# Patient Record
Sex: Male | Born: 1935 | Race: White | Hispanic: No | Marital: Single
Health system: Midwestern US, Community
[De-identification: ages and names within clinical notes are randomized; demographics above are authoritative.]

## PROBLEM LIST (undated history)

## (undated) DIAGNOSIS — I639 Cerebral infarction, unspecified: Secondary | ICD-10-CM

## (undated) DIAGNOSIS — E785 Hyperlipidemia, unspecified: Secondary | ICD-10-CM

## (undated) DIAGNOSIS — I219 Acute myocardial infarction, unspecified: Secondary | ICD-10-CM

## (undated) DIAGNOSIS — R011 Cardiac murmur, unspecified: Secondary | ICD-10-CM

## (undated) DIAGNOSIS — M48061 Spinal stenosis, lumbar region without neurogenic claudication: Secondary | ICD-10-CM

## (undated) DIAGNOSIS — E039 Hypothyroidism, unspecified: Secondary | ICD-10-CM

## (undated) DIAGNOSIS — I6529 Occlusion and stenosis of unspecified carotid artery: Secondary | ICD-10-CM

## (undated) DIAGNOSIS — C801 Malignant (primary) neoplasm, unspecified: Secondary | ICD-10-CM

## (undated) DIAGNOSIS — I10 Essential (primary) hypertension: Secondary | ICD-10-CM

## (undated) DIAGNOSIS — N529 Male erectile dysfunction, unspecified: Secondary | ICD-10-CM

## (undated) HISTORY — PX: CATARACT EXTRACTION, BILATERAL: SHX1313

## (undated) HISTORY — DX: Occlusion and stenosis of unspecified carotid artery: I65.29

## (undated) HISTORY — PX: TONSILLECTOMY AND ADENOIDECTOMY: SUR1326

## (undated) HISTORY — PX: APPENDECTOMY: SHX54

## (undated) HISTORY — DX: Hyperlipidemia, unspecified: E78.5

## (undated) HISTORY — DX: Essential (primary) hypertension: I10

## (undated) HISTORY — DX: Hypothyroidism, unspecified: E03.9

## (undated) HISTORY — DX: Cerebral infarction, unspecified: I63.9

## (undated) HISTORY — DX: Male erectile dysfunction, unspecified: N52.9

## (undated) HISTORY — DX: Malignant (primary) neoplasm, unspecified: C80.1

## (undated) HISTORY — DX: Spinal stenosis, lumbar region without neurogenic claudication: M48.061

---

## 2008-08-17 ENCOUNTER — Encounter (INDEPENDENT_AMBULATORY_CARE_PROVIDER_SITE_OTHER): Payer: Self-pay | Admitting: Internal Medicine

## 2008-08-17 ENCOUNTER — Inpatient Hospital Stay (HOSPITAL_COMMUNITY): Admission: EM | Admit: 2008-08-17 | Discharge: 2008-08-18 | Payer: Self-pay | Admitting: Emergency Medicine

## 2008-08-18 ENCOUNTER — Encounter (INDEPENDENT_AMBULATORY_CARE_PROVIDER_SITE_OTHER): Payer: Self-pay | Admitting: Internal Medicine

## 2010-05-15 LAB — LIPID PANEL
Cholesterol: 173 mg/dL (ref 0–200)
Total CHOL/HDL Ratio: 4.3 RATIO

## 2010-05-15 LAB — COMPREHENSIVE METABOLIC PANEL
BUN: 10 mg/dL (ref 6–23)
Calcium: 9 mg/dL (ref 8.4–10.5)
Glucose, Bld: 107 mg/dL — ABNORMAL HIGH (ref 70–99)
Sodium: 137 mEq/L (ref 135–145)
Total Protein: 6.5 g/dL (ref 6.0–8.3)

## 2010-05-15 LAB — GLUCOSE, CAPILLARY: Glucose-Capillary: 101 mg/dL — ABNORMAL HIGH (ref 70–99)

## 2010-05-15 LAB — BASIC METABOLIC PANEL
BUN: 10 mg/dL (ref 6–23)
CO2: 28 mEq/L (ref 19–32)
Calcium: 9.4 mg/dL (ref 8.4–10.5)
GFR calc non Af Amer: >60 mL/min (ref >60)
Glucose, Bld: 102 mg/dL — ABNORMAL HIGH (ref 70–99)
Potassium: 4.6 mEq/L (ref 3.5–5.1)

## 2010-05-15 LAB — APTT: aPTT: 27 seconds (ref 24–37)

## 2010-05-15 LAB — PROTIME-INR: INR: 1.1 (ref 0.00–1.49)

## 2010-05-15 LAB — CBC
HCT: 43 % (ref 39.0–52.0)
MCHC: 34 g/dL (ref 30.0–36.0)
MCHC: 35.2 g/dL (ref 30.0–36.0)
Platelets: 185 10*3/uL (ref 150–400)
RBC: 4.51 MIL/uL (ref 4.22–5.81)
RDW: 14.6 % (ref 11.5–15.5)

## 2010-05-15 LAB — CK TOTAL AND CKMB (NOT AT ARMC)
CK, MB: 3.7 ng/mL (ref 0.3–4.0)
Total CK: 130 U/L (ref 7–232)

## 2010-05-15 LAB — DIFFERENTIAL
Lymphocytes Relative: 19 % (ref 12–46)
Lymphs Abs: 1.1 10*3/uL (ref 0.7–4.0)
Monocytes Relative: 10 % (ref 3–12)
Neutro Abs: 4.1 10*3/uL (ref 1.7–7.7)
Neutrophils Relative %: 68 % (ref 43–77)

## 2010-05-15 LAB — DRUGS OF ABUSE SCREEN W/O ALC, ROUTINE URINE
Benzodiazepines.: NEGATIVE
Cocaine Metabolites: NEGATIVE
Methadone: NEGATIVE
Opiate Screen, Urine: NEGATIVE

## 2010-06-21 NOTE — Consult Note (Signed)
NAMEKAYODE, PETION NO.:  [id number]   MEDICAL RECORD NO.:  [id number]          PATIENT TYPE:  INP   LOCATION:  3703                         FACILITY:  [facility]   PHYSICIAN:  Pramod P. Pearlean Brownie, MD    DATE OF BIRTH:  14-Jul-1935   DATE OF CONSULTATION:  08/17/2008  DATE OF DISCHARGE:                                 CONSULTATION   REFERRING PHYSICIAN:  Kennon Rounds, MD   REASON FOR REFERRAL:  Stroke.   HISTORY OF PRESENT ILLNESS:  Mr. Esty is a 75 year old pleasant  Caucasian male who developed sudden onset of dizziness and gait ataxia  and right foot numbness 2 days ago when he was at a concert and got up  during the intermission to go to the restroom.  He denied any nausea,  blurred vision, vertigo, or focal extremity weakness.  He stated that he  did notice that his right hand was little clumsy and possibly weak,  These symptoms have persisted for last 2 days without significant  worsening or improving.  He denies any headache.  No prior history of  stroke, TIA, or seizures.  He did do spare bending the whole of Saturday  but did not a mask.   PAST MEDICAL HISTORY:  1. Hypertension.  2. Hyperlipidemia.  3. Hypothyroidism.   HOME MEDICATIONS:  Synthroid, amlodipine, and Zocor.   MEDICATION ALLERGIES:  None known.   PAST SURGICAL HISTORY:  Appendicectomy and left hand Dupuytren  contracture surgery x2.   SOCIAL HISTORY:  The patient is married, lives with his wife.  He is  retired but works part-time as a Optician, dispensing as well as a Lawyer.  He does not smoke or drink.   REVIEW OF SYSTEMS:  Negative for recent fever, cough, chest pain,  diarrhea, or any other intercurrent illness.   PHYSICAL EXAMINATION:  GENERAL:  A pleasant, elderly Caucasian gentleman  who is currently not in distress.  VITAL SIGNS:  He is afebrile, pulse rate of 88 per minute and regular,  and respiratory rate 16.  Distal pulses are well felt.  Blood pressure  is 181/85  and oxygen saturations are 97% on room air.  NEUROLOGICAL:  He is pleasant, awake, alert, and cooperative.  There is  no aphasia, apraxia, or dysarthria.  Eye movements are full range.  Visual acuity and fields are accurate.  Face is symmetric.  Tongue is  midline.  Motor system exam reveals no upper extremity drift.  He,  however, has some diminished fine finger movements on the right.  He  orbits the left over right upper extremity.  He has symmetric grip and  lower extremity strength.  Finger-to-nose coordination slightly off on  the right and knee-to-heel is also off on the right.  Sensation is  intact.  Gait was not tested.   The patient does complain of some subjective decreased sensation in the  right foot from ankle down.  On the NIH stroke scale, he scored 2.  Modified Rankin scale 1.   DATA REVIEWED:  CT scan of the head without contrast shows no acute  abnormality.  ADMISSION LABORATORY DATA:  WBC count and basic metabolic panel labs are  normal.   IMPRESSION:  A 75 year old gentleman with left brain subcortical  infarct, 36 days old.  The patient does not qualify for aggressive  intervention due to time of onset.   PLAN:  The patient is being admitted for further stroke workup.  Check  MRI scan of the brain with MRA of the brain, carotid ultrasound, 2-D  echocardiogram, fasting lipid profile, and hemoglobin A1c.  Start  aspirin 325 mg a day.  The patient may consider participating in IRIS  stroke prevention trial.  I have discussed this briefly with the  patient, he seems interested and we will give more information to review  and make final decision later.  Thank you for the referral.  I will be  happy to follow the patient in consults.           ______________________________  Sunny Schlein. Pearlean Brownie, MD     PPS/MEDQ  D:  08/17/2008  T:  08/18/2008  Job:  [id number]

## 2010-06-21 NOTE — H&P (Signed)
Taylor Keller, Taylor Keller NO.:  [id number]   MEDICAL RECORD NO.:  [id number]          PATIENT TYPE:  EMS   LOCATION:  MAJO                         FACILITY:  [facility]   PHYSICIAN:  Peggye Pitt, M.D. DATE OF BIRTH:  Aug 24, 1935   DATE OF ADMISSION:  08/17/2008  DATE OF DISCHARGE:                              HISTORY & PHYSICAL   PRIMARY CARE PHYSICIAN:  He attends Film/video editor.   CHIEF COMPLAINT:  Right-sided numbness.   HISTORY OF PRESENT ILLNESS:  Taylor Keller is a very pleasant 75 year old  Caucasian gentleman who was at a concert on Saturday which is 2 days  prior to admission, where he started experiencing right gait ataxia,  clumsiness as well as numbness of his right foot and hand.  This slowly  progressed over the weekend, and, on Monday, at his wife's insistence,  he visited his primary care physician who instructed him to come into  the emergency department for further evaluation and management.  He  denies any blurry vision, chest pain, shortness of breath, vertigo,  headache or nausea.   ALLERGIES:  He has no known drug allergies.   PAST MEDICAL HISTORY:  Significant for hypertension, hyperlipidemia,  hypothyroidism.   HOME MEDICATIONS:  He takes Synthroid, amlodipine, simvastatin, Avodart,  all of these at unknown doses.  Will try to contact his outpatient  pharmacy for further medication clarification.   SOCIAL HISTORY:  He is married and lives with his wife, has two  children.  No alcohol, tobacco or illicit drug use.   FAMILY HISTORY:  He had a mother who had a CVA in her 75s, otherwise  noncontributory.   REVIEW OF SYSTEMS:  Negative except as already mentioned on HPI.   PHYSICAL EXAMINATION:  VITAL SIGNS:  Upon admission, blood pressure  181/85, heart rate 68, respirations 24, O2 saturations 97% on room air  with a temperature of 97.7.  GENERAL:  He is alert, awake, oriented x3, does not appear to be in any  distress.  HEENT: Normocephalic, atraumatic.  His pupils are equally reactive to  light and accommodation with intact extraocular movements.  He has no  nystagmus  CARDIOVASCULAR:  He has regular rate and rhythm with no murmurs, rubs or  gallops.  LUNGS:  Clear to auscultation bilaterally.  ABDOMEN:  Soft, nontender, nondistended with positive bowel sounds.  EXTREMITIES:  No clubbing, cyanosis or edema with positive pedal pulses.  SKIN:  He has some hypopigmented areas over his shins as well as arms.  He states that this is a chronic skin condition that is being managed by  a dermatologist.  NEUROLOGICAL EXAM:  Mental status is intact.  His cranial nerves II-XII  are intact.  His muscle strength is 5/5 bilaterally and symmetrically.  DTRs are 2/2+ symmetric.  He has decreased sensation from his right  ankle down.  Proprioception is intact.  Babinski is downgoing  bilaterally.  His finger-to-nose is normal.  I have not ambulated him.   LABORATORIES UPON ADMISSION:  Sodium 137, potassium 3.9, chloride 108,  bicarb 23, BUN 10, creatinine 0.87 with a glucose of  107.  All of his  LFTs are within normal limits.  First set of point-of-care markers is  negative.  WBC 6, hemoglobin 14.6, platelet count 200.  His EKG shows  normal sinus rhythm with no acute ST-T wave changes.   A CT scan of the head shows no acute intracranial abnormalities with  small vessel disease.   ASSESSMENT AND PLAN:  1. A likely subacute left hemispheric cerebrovascular accident.  We      will check an MRI/MRA.  We will start him on aspirin.  We will      initiate a stroke workup including 2-D echocardiogram, carotid      Dopplers, a fasting lipid profile.  We will have physical therapy      and occupational therapy see him.  He has already been evaluated by      Dr. Pearlean Brownie with neurology.  2. For his hypothyroidism, we will check a TSH and try to confirm his      home Synthroid dose.  3. For his hypertension, we will start him  on 10 mg amlodipine daily.  4. For his hyperlipidemia, we will check an FLP and confirm his statin      dose prior to reinitiating.  5. For prophylaxis while in the hospital, he will be on Protonix for      gastrointestinal prophylaxis and on Lovenox for deep venous      thrombosis prophylaxis.      Peggye Pitt, M.D.  Electronically Signed     EH/MEDQ  D:  08/17/2008  T:  08/17/2008  Job:  [id number]

## 2010-06-21 NOTE — Discharge Summary (Signed)
NAMEADRIENNE, Taylor Keller NO.:  [id number]   MEDICAL RECORD NO.:  [id number]          PATIENT TYPE:  INP   LOCATION:  3703                         FACILITY:  [facility]   PHYSICIAN:  Altha Harm, MDDATE OF BIRTH:  1935/08/13   DATE OF ADMISSION:  08/17/2008  DATE OF DISCHARGE:  08/18/2008                               DISCHARGE SUMMARY   DISCHARGE DISPOSITION:  Home.   FINAL DISCHARGE DIAGNOSES:  1. Pontine infarct.  2. Hyperlipidemia.  3. Hypertension.  4. Benign prostatic hypertrophy.  5. Hypothyroidism.   DISCHARGE MEDICATIONS:  1. Synthroid 200 mcg p.o. daily  2. Norvasc 10 mg p.o. daily.  3. Simvastatin 10 mg p.o. daily.  4. Avodart 0.5 mg p.o. daily.  5. Aspirin, enteric-coated 325 mg p.o. daily.   CONSULTANTS:  Dr. Pearlean Brownie, neurology.   PROCEDURE:  None.   DIAGNOSTIC STUDIES:  1. 2-D echocardiogram which shows normal left ventricular size and      function with ejection fraction preserved in the range of 60-65%.      The wall motion is normal without any regional wall motion      abnormalities.  2. CT of the head without contrast done on admission which shows no      acute intracranial findings.  Impression:  Evidence of small vessel      ischemia.  3. MRI and MRA of the brain which shows acute infarct in the left pons      measuring 8 mm without mass effect or hemorrhage, negative MRI      appearance of the brain for age.  Negative intracranial MRA with      dominant left vertebral artery.  4. Study bilateral carotid duplex which shows no ICA stenosis      bilaterally and antegrade vertebral artery flow.   ALLERGIES:  NO KNOWN DRUG ALLERGIES.   PRIMARY CARE PHYSICIAN:  [facility].   CODE STATUS:  Full code.   CHIEF COMPLAINT:  Right-sided numbness.   HISTORY OF PRESENT ILLNESS:  Please refer to the H and P dictated by Dr.  Ardyth Harps for details of the HPI.   HOSPITAL COURSE:  The patient was admitted, started on  aspirin and  observed on telemetry.  The patient had minimal weakness in the right  lower extremity.  MRA, MRI, 2-D echocardiogram and carotid artery duplex  were performed with findings as seen above.  Neurology was consulted who  agreed with management and recommendations for this patient.  The  patient does have some hyperlipidemia and is currently under treatment.  Recommendations are for follow up with his cholesterol in 1-2 months.  The patient was seen by physical therapy who did not identify any  further acute or long-term therapy needs at this time.  In terms of his  chronic condition, the patient is resumed on his Synthroid and his  Avodart and his amlodipine.  The patient is ambulatory.  He is afebrile,  temperature of 98, heart rate 63, blood pressure 161/92, respiratory  rate 20, O2 sats are 95% on room air.  Hemogram and electrolytes were  all  normal.  As noted, cholesterol is mildly elevated at 173 with a LDL  mildly elevated at 109.  Hemoglobin A1c was 5.6.   DIETARY RESTRICTIONS:  The patient should be on a low-cholesterol, low-  sodium diet.   PHYSICAL RESTRICTIONS:  Activity as tolerated.   FOLLOW UP:  The patient to follow up with primary care physician in 1-2  weeks or as needed, and to follow up with neurology in 3 months.  Total  time for this discharge process 30 minutes.      Altha Harm, MD  Electronically Signed     MAM/MEDQ  D:  08/18/2008  T:  08/18/2008  Job:  [id number]

## 2010-07-25 LAB — HM COLONOSCOPY

## 2012-03-14 ENCOUNTER — Ambulatory Visit
Admission: RE | Admit: 2012-03-14 | Discharge: 2012-03-14 | Disposition: A | Discharge status: Home / Self Care | Payer: Medicare Other | Source: Ambulatory Visit | Attending: Family Medicine | Admitting: Family Medicine

## 2012-03-14 ENCOUNTER — Other Ambulatory Visit: Payer: Self-pay | Admitting: Family Medicine

## 2012-03-14 DIAGNOSIS — R05 Cough: Secondary | ICD-10-CM

## 2012-04-29 ENCOUNTER — Telehealth: Payer: Self-pay | Admitting: Family Medicine

## 2012-04-29 MED ORDER — SIMVASTATIN 20 MG PO TABS: 20.0000 mg | ORAL_TABLET | Freq: Every day | ORAL | Status: DC

## 2012-04-29 NOTE — Telephone Encounter (Signed)
Done

## 2012-05-18 ENCOUNTER — Encounter: Payer: Self-pay | Admitting: Family Medicine

## 2012-05-18 DIAGNOSIS — E785 Hyperlipidemia, unspecified: Secondary | ICD-10-CM | POA: Insufficient documentation

## 2012-05-18 DIAGNOSIS — N529 Male erectile dysfunction, unspecified: Secondary | ICD-10-CM | POA: Insufficient documentation

## 2012-05-18 DIAGNOSIS — I10 Essential (primary) hypertension: Secondary | ICD-10-CM | POA: Insufficient documentation

## 2012-05-18 DIAGNOSIS — E039 Hypothyroidism, unspecified: Secondary | ICD-10-CM | POA: Insufficient documentation

## 2012-05-27 ENCOUNTER — Ambulatory Visit (INDEPENDENT_AMBULATORY_CARE_PROVIDER_SITE_OTHER): Payer: PRIVATE HEALTH INSURANCE | Admitting: Family Medicine

## 2012-05-27 ENCOUNTER — Encounter: Payer: Self-pay | Admitting: Family Medicine

## 2012-05-27 VITALS — BP 120/58 | HR 70 | Temp 98.2°F | Resp 14 | Ht 72.0 in | Wt 201.0 lb

## 2012-05-27 DIAGNOSIS — R0989 Other specified symptoms and signs involving the circulatory and respiratory systems: Secondary | ICD-10-CM

## 2012-05-27 DIAGNOSIS — Z0000 Encounter for general adult medical examination without abnormal findings: Secondary | ICD-10-CM

## 2012-05-27 DIAGNOSIS — E039 Hypothyroidism, unspecified: Secondary | ICD-10-CM

## 2012-05-27 DIAGNOSIS — Z125 Encounter for screening for malignant neoplasm of prostate: Secondary | ICD-10-CM

## 2012-05-27 DIAGNOSIS — E785 Hyperlipidemia, unspecified: Secondary | ICD-10-CM

## 2012-05-27 DIAGNOSIS — I10 Essential (primary) hypertension: Secondary | ICD-10-CM

## 2012-05-27 NOTE — Progress Notes (Signed)
Subjective:    Patient ID: Taylor Keller, male    DOB: [birth year], 77 y.o.   MRN: [id number]  HPI  Patient is here today for complete physical exam.  His cough which he reported at his office visit in February subsided after beginning Nexium. The patient then discontinued the Nexium due to cost.  He now reports some postnasal drip but otherwise feels well. Past Medical History  Diagnosis Date  . Lumbar stenosis     L5  . Hypothyroidism   . Hyperlipidemia   . Hypertension   . Stroke   . ED (erectile dysfunction)    Current Outpatient Prescriptions on File Prior to Visit  Medication Sig Dispense Refill  . amLODipine (NORVASC) 5 MG tablet Take 5 mg by mouth daily.      Marland Kitchen aspirin 81 MG tablet Take 81 mg by mouth daily.      Marland Kitchen esomeprazole (NEXIUM) 40 MG capsule Take 40 mg by mouth daily before breakfast.      . hydrochlorothiazide (MICROZIDE) 12.5 MG capsule Take 12.5 mg by mouth daily.      Marland Kitchen levothyroxine (SYNTHROID, LEVOTHROID) 175 MCG tablet Take 175 mcg by mouth daily before breakfast.      . simvastatin (ZOCOR) 20 MG tablet Take 1 tablet (20 mg total) by mouth at bedtime.  30 tablet  3  . tadalafil (CIALIS) 20 MG tablet Take 20 mg by mouth daily as needed for erectile dysfunction.      . valsartan (DIOVAN) 320 MG tablet Take 320 mg by mouth daily.       No current facility-administered medications on file prior to visit.   Allergies  Allergen Reactions  . Benazepril Cough   History   Social History  . Marital Status: Married    Spouse Name: N/A    Number of Children: N/A  . Years of Education: N/A   Occupational History  . Not on file.   Social History Main Topics  . Smoking status: Never Smoker   . Smokeless tobacco: Not on file  . Alcohol Use: Yes     Comment: Occasional wine  . Drug Use: No  . Sexually Active: Not on file   Other Topics Concern  . Not on file   Social History Narrative  . No narrative on file   Family History  Problem Relation Age of  Onset  . Cancer Sister     colon     Review of Systems  Constitutional: Negative.   HENT: Positive for rhinorrhea and postnasal drip.   Eyes: Negative.   Respiratory: Negative.   Cardiovascular: Negative.   Gastrointestinal: Negative.   Endocrine: Negative.   Genitourinary: Negative.   Allergic/Immunologic: Negative.   Neurological: Negative.   Hematological: Negative.   Psychiatric/Behavioral: Negative.        Objective:   Physical Exam  Constitutional: He is oriented to person, place, and time. He appears well-developed and well-nourished.  HENT:  Head: Normocephalic and atraumatic.  Right Ear: External ear normal.  Left Ear: External ear normal.  Nose: Nose normal.  Mouth/Throat: Oropharynx is clear and moist.  Eyes: Conjunctivae and EOM are normal. Pupils are equal, round, and reactive to light. No scleral icterus.  Neck: Normal range of motion. Neck supple. No JVD present. Carotid bruit is present. No tracheal deviation present. No thyromegaly present.  Cardiovascular: Normal rate and regular rhythm.   Murmur (3/6 over aortic valve) heard. Pulmonary/Chest: Effort normal and breath sounds normal. No respiratory distress. He has no  wheezes. He has no rales. He exhibits no tenderness.  Abdominal: Soft. Bowel sounds are normal. He exhibits no distension and no mass. There is no tenderness. There is no rebound and no guarding.  Genitourinary: Rectum normal, prostate normal and penis normal.  Musculoskeletal: Normal range of motion. He exhibits no edema and no tenderness.  Lymphadenopathy:    He has no cervical adenopathy.  Neurological: He is alert and oriented to person, place, and time. He has normal reflexes. He displays normal reflexes. No cranial nerve deficit. He exhibits normal muscle tone. Coordination normal.  Skin: Skin is warm and dry. No rash noted. No erythema. No pallor.  Psychiatric: He has a normal mood and affect. His behavior is normal. Judgment and  thought content normal.   he has vitiligo on his skin.        Assessment & Plan:  1. Routine general medical examination at a health care facility Normal exam, blood pressure is well controlled, will obtain screening labs with the patient returns fasting. - CBC with Differential; Future - Basic Metabolic Panel; Future - Hepatic Function Panel; Future - Lipid Panel; Future - PSA; Future - US Carotid Duplex Bilateral; Future  2. HTN (hypertension) Controlled same. - Basic Metabolic Panel; Future  3. HLD (hyperlipidemia) LDL is less than 70. - Basic Metabolic Panel; Future - Hepatic Function Panel; Future - Lipid Panel; Future  4. Unspecified hypothyroidism Monitor TSH - TSH; Future  5. Carotid bruit This may be transmitted sounds due to his murmur which I believe is aortic stenosis. However we'll obtain carotid Dopplers to rule out carotid stenosis given his history of CVA - US Carotid Duplex Bilateral; Future  6. Prostate cancer screening Normal rectal exam, obtain PSA. - PSA; Future

## 2012-05-29 ENCOUNTER — Other Ambulatory Visit: Payer: Self-pay | Admitting: Family Medicine

## 2012-05-29 ENCOUNTER — Other Ambulatory Visit (INDEPENDENT_AMBULATORY_CARE_PROVIDER_SITE_OTHER): Payer: PRIVATE HEALTH INSURANCE

## 2012-05-29 DIAGNOSIS — Z0000 Encounter for general adult medical examination without abnormal findings: Secondary | ICD-10-CM

## 2012-05-29 DIAGNOSIS — E039 Hypothyroidism, unspecified: Secondary | ICD-10-CM

## 2012-05-29 DIAGNOSIS — Z125 Encounter for screening for malignant neoplasm of prostate: Secondary | ICD-10-CM

## 2012-05-29 DIAGNOSIS — I10 Essential (primary) hypertension: Secondary | ICD-10-CM

## 2012-05-29 DIAGNOSIS — E785 Hyperlipidemia, unspecified: Secondary | ICD-10-CM

## 2012-05-29 LAB — HEPATIC FUNCTION PANEL
Alkaline Phosphatase: 43 U/L (ref 39–117)
Indirect Bilirubin: 0.3 mg/dL (ref 0.0–0.9)
Total Bilirubin: 0.4 mg/dL (ref 0.3–1.2)

## 2012-05-29 LAB — CBC WITH DIFFERENTIAL/PLATELET
Basophils Relative: 0 % (ref 0–1)
Eosinophils Absolute: 0.1 10*3/uL (ref 0.0–0.7)
Lymphs Abs: 1.1 10*3/uL (ref 0.7–4.0)
MCH: 26.8 pg (ref 26.0–34.0)
Neutrophils Relative %: 74 % (ref 43–77)
Platelets: 436 10*3/uL — ABNORMAL HIGH (ref 150–400)
RBC: 4.03 MIL/uL — ABNORMAL LOW (ref 4.22–5.81)

## 2012-05-29 LAB — TSH: TSH: 2.853 u[IU]/mL (ref 0.350–4.500)

## 2012-05-29 LAB — LIPID PANEL
HDL: 38 mg/dL — ABNORMAL LOW (ref >39)
LDL Cholesterol: 60 mg/dL (ref 0–99)
Total CHOL/HDL Ratio: 2.9 Ratio
VLDL: 11 mg/dL (ref 0–40)

## 2012-05-29 LAB — BASIC METABOLIC PANEL
CO2: 27 mEq/L (ref 19–32)
Calcium: 9.1 mg/dL (ref 8.4–10.5)
Sodium: 135 mEq/L (ref 135–145)

## 2012-05-30 LAB — VITAMIN B12: Vitamin B-12: 380 pg/mL (ref 211–911)

## 2012-06-03 ENCOUNTER — Other Ambulatory Visit: Payer: PRIVATE HEALTH INSURANCE

## 2012-06-04 ENCOUNTER — Telehealth: Payer: Self-pay | Admitting: Family Medicine

## 2012-06-04 MED ORDER — AMLODIPINE BESYLATE 5 MG PO TABS: 5.0000 mg | ORAL_TABLET | Freq: Every day | ORAL | Status: DC

## 2012-06-04 MED ORDER — SIMVASTATIN 20 MG PO TABS: 20.0000 mg | ORAL_TABLET | Freq: Every day | ORAL | Status: DC

## 2012-06-04 MED ORDER — HYDROCHLOROTHIAZIDE 12.5 MG PO CAPS: 12.5000 mg | ORAL_CAPSULE | Freq: Every day | ORAL | Status: DC

## 2012-06-04 NOTE — Telephone Encounter (Signed)
Medication refilled per protocol. 

## 2012-06-05 ENCOUNTER — Ambulatory Visit
Admission: RE | Admit: 2012-06-05 | Discharge: 2012-06-05 | Disposition: A | Discharge status: Home / Self Care | Payer: Medicare Other | Source: Ambulatory Visit | Attending: Family Medicine | Admitting: Family Medicine

## 2012-06-05 DIAGNOSIS — R0989 Other specified symptoms and signs involving the circulatory and respiratory systems: Secondary | ICD-10-CM

## 2012-06-05 DIAGNOSIS — Z0000 Encounter for general adult medical examination without abnormal findings: Secondary | ICD-10-CM

## 2012-06-05 LAB — FECAL OCCULT BLOOD, IMMUNOCHEMICAL: Fecal Occult Blood: NEGATIVE

## 2012-06-08 ENCOUNTER — Other Ambulatory Visit: Payer: Self-pay | Admitting: Family Medicine

## 2012-07-05 ENCOUNTER — Telehealth: Payer: Self-pay | Admitting: Family Medicine

## 2012-07-05 MED ORDER — SIMVASTATIN 20 MG PO TABS: 20.0000 mg | ORAL_TABLET | Freq: Every day | ORAL | Status: DC

## 2012-07-05 MED ORDER — HYDROCHLOROTHIAZIDE 12.5 MG PO CAPS: ORAL_CAPSULE | ORAL | Status: DC

## 2012-07-05 NOTE — Telephone Encounter (Signed)
Rx Refilled  

## 2012-07-08 ENCOUNTER — Other Ambulatory Visit (INDEPENDENT_AMBULATORY_CARE_PROVIDER_SITE_OTHER): Payer: PRIVATE HEALTH INSURANCE

## 2012-07-08 ENCOUNTER — Telehealth: Payer: Self-pay | Admitting: Family Medicine

## 2012-07-08 DIAGNOSIS — D649 Anemia, unspecified: Secondary | ICD-10-CM

## 2012-07-08 LAB — CBC WITH DIFFERENTIAL/PLATELET
Basophils Relative: 0 % (ref 0–1)
Eosinophils Absolute: 0.3 10*3/uL (ref 0.0–0.7)
Eosinophils Relative: 4 % (ref 0–5)
HCT: 34 % — ABNORMAL LOW (ref 39.0–52.0)
Hemoglobin: 10.8 g/dL — ABNORMAL LOW (ref 13.0–17.0)
MCH: 25.8 pg — ABNORMAL LOW (ref 26.0–34.0)
MCHC: 31.8 g/dL (ref 30.0–36.0)
MCV: 81.3 fL (ref 78.0–100.0)
Monocytes Absolute: 0.7 10*3/uL (ref 0.1–1.0)
Monocytes Relative: 9 % (ref 3–12)
RDW: 16.6 % — ABNORMAL HIGH (ref 11.5–15.5)

## 2012-07-08 MED ORDER — AMLODIPINE BESYLATE 5 MG PO TABS: 5.0000 mg | ORAL_TABLET | Freq: Every day | ORAL | Status: DC

## 2012-07-08 NOTE — Telephone Encounter (Signed)
Rx Refilled  

## 2012-08-13 ENCOUNTER — Encounter: Payer: Self-pay | Admitting: Family Medicine

## 2012-08-13 ENCOUNTER — Telehealth: Payer: Self-pay | Admitting: Family Medicine

## 2012-08-14 NOTE — Telephone Encounter (Signed)
LMTRC

## 2012-08-22 NOTE — Telephone Encounter (Signed)
LMTRC

## 2012-08-27 NOTE — Telephone Encounter (Signed)
Pt has been called numerous times with messages left to return call and has failed to do so. Closing note.

## 2012-08-28 ENCOUNTER — Telehealth: Payer: Self-pay | Admitting: Family Medicine

## 2012-08-28 MED ORDER — AMLODIPINE BESYLATE 5 MG PO TABS: 5.0000 mg | ORAL_TABLET | Freq: Every day | ORAL | Status: DC

## 2012-08-28 NOTE — Telephone Encounter (Signed)
Med refilled for 90 day supply  

## 2012-12-04 ENCOUNTER — Ambulatory Visit (INDEPENDENT_AMBULATORY_CARE_PROVIDER_SITE_OTHER): Payer: PRIVATE HEALTH INSURANCE | Admitting: *Deleted

## 2012-12-04 DIAGNOSIS — Z23 Encounter for immunization: Secondary | ICD-10-CM

## 2012-12-10 ENCOUNTER — Encounter: Payer: Self-pay | Admitting: Family Medicine

## 2012-12-10 ENCOUNTER — Ambulatory Visit (INDEPENDENT_AMBULATORY_CARE_PROVIDER_SITE_OTHER): Payer: PRIVATE HEALTH INSURANCE | Admitting: Family Medicine

## 2012-12-10 VITALS — BP 122/58 | HR 78 | Temp 98.6°F | Resp 16 | Ht 72.0 in | Wt 193.0 lb

## 2012-12-10 DIAGNOSIS — H938X9 Other specified disorders of ear, unspecified ear: Secondary | ICD-10-CM

## 2012-12-10 DIAGNOSIS — H938X2 Other specified disorders of left ear: Secondary | ICD-10-CM

## 2012-12-10 NOTE — Progress Notes (Signed)
Subjective:    Patient ID: Taylor Keller, male    DOB: May 07, 1935, 77 y.o.   MRN: [id number]  HPI  Patient is here because of a mass behind his left ear. He states it has been there his entire life. However he is being fitted for a hearing aid interferes with sitting process. It is approximately 3 cm x 2 cm it is pedunculated. It contains visible blood vessels.  It appears to be some type of hemangioma.  He also complains of disequilibrium. This occurs whenever he stands quickly. It lasted momentarily. He denies any vertigo. He believes it could be his blood pressure dropping. He is on amlodipine, Diovan, hydrochlorothiazide for hypertension. His blood pressure today is well controlled 122/58. He denies any syncope or vertigo. He denies any headaches or other neurologic deficits.  Past Medical History  Diagnosis Date  . Lumbar stenosis     L5  . Hypothyroidism   . Hyperlipidemia   . Hypertension   . Stroke   . ED (erectile dysfunction)    Current Outpatient Prescriptions on File Prior to Visit  Medication Sig Dispense Refill  . amLODipine (NORVASC) 5 MG tablet Take 1 tablet (5 mg total) by mouth daily.  90 tablet  1  . aspirin 81 MG tablet Take 81 mg by mouth daily.      Marland Kitchen DIOVAN 320 MG tablet TAKE ONE TABLET BY MOUTH EVERY DAY  90 tablet  0  . esomeprazole (NEXIUM) 40 MG capsule Take 40 mg by mouth daily before breakfast.      . hydrochlorothiazide (MICROZIDE) 12.5 MG capsule TAKE ONE CAPSULE BY MOUTH EVERY DAY  90 capsule  1  . levothyroxine (SYNTHROID, LEVOTHROID) 175 MCG tablet Take 175 mcg by mouth daily before breakfast.      . simvastatin (ZOCOR) 20 MG tablet Take 1 tablet (20 mg total) by mouth at bedtime.  90 tablet  1  . tadalafil (CIALIS) 20 MG tablet Take 20 mg by mouth daily as needed for erectile dysfunction.       No current facility-administered medications on file prior to visit.   Allergies  Allergen Reactions  . Benazepril Cough   History   Social  History  . Marital Status: Married    Spouse Name: N/A    Number of Children: N/A  . Years of Education: N/A   Occupational History  . Not on file.   Social History Main Topics  . Smoking status: Never Smoker   . Smokeless tobacco: Not on file  . Alcohol Use: Yes     Comment: Occasional wine  . Drug Use: No  . Sexual Activity: Not on file   Other Topics Concern  . Not on file   Social History Narrative  . No narrative on file     Review of Systems  All other systems reviewed and are negative.       Objective:   Physical Exam  Vitals reviewed. Constitutional: He is oriented to person, place, and time. He appears well-developed and well-nourished.  Cardiovascular: Normal rate and regular rhythm.  Exam reveals no gallop and no friction rub.   Murmur heard. Pulmonary/Chest: Effort normal and breath sounds normal. No respiratory distress. He has no wheezes. He has no rales. He exhibits no tenderness.  Abdominal: Soft. Bowel sounds are normal. He exhibits no distension. There is no tenderness. There is no rebound and no guarding.  Neurological: He is alert and oriented to person, place, and time. He has normal reflexes.  He displays normal reflexes. No cranial nerve deficit. He exhibits normal muscle tone. Coordination normal.   Patient has a normal Romberg sign. He has a 2 cm x 3 cm pedunculated soft tissue mass behind the left ear that appears to be some type of hemangioma.       Assessment & Plan:  1. Mass of left ear I recommended a general surgery consultation for excision and removal. I'm afraid to try to remove this on my own due to its vascularity.  I also recommended a biopsy of the lesion to determine the mass is.  - Ambulatory referral to General Surgery   I believe his disequilibrium is likely orthostatic dizziness. I recommended an MRI of the brain and MRA of head and neck he develops constant unprovoked disequilibrium.  For the time being we will monitor  clinically

## 2012-12-12 ENCOUNTER — Other Ambulatory Visit (INDEPENDENT_AMBULATORY_CARE_PROVIDER_SITE_OTHER): Payer: Self-pay | Admitting: *Deleted

## 2012-12-13 ENCOUNTER — Telehealth: Payer: Self-pay | Admitting: Family Medicine

## 2012-12-13 ENCOUNTER — Other Ambulatory Visit: Payer: Self-pay | Admitting: Family Medicine

## 2012-12-13 DIAGNOSIS — H938X2 Other specified disorders of left ear: Secondary | ICD-10-CM

## 2012-12-13 NOTE — Telephone Encounter (Signed)
Liborio Nixon at the [facility] called to report that Britta Mccreedy and Belenda Cruise accessed Mr. Taylor Keller and said that he would need  to be seen by  an ENT or a [facility] .

## 2012-12-13 NOTE — Telephone Encounter (Signed)
Can we refer to ENT for excision of mass behind ear.

## 2012-12-14 ENCOUNTER — Other Ambulatory Visit: Payer: Self-pay | Admitting: Family Medicine

## 2012-12-28 ENCOUNTER — Other Ambulatory Visit: Payer: Self-pay | Admitting: Family Medicine

## 2012-12-29 ENCOUNTER — Other Ambulatory Visit: Payer: Self-pay | Admitting: Family Medicine

## 2013-02-08 ENCOUNTER — Other Ambulatory Visit: Payer: Self-pay | Admitting: Family Medicine

## 2013-04-05 ENCOUNTER — Other Ambulatory Visit: Payer: Self-pay | Admitting: Family Medicine

## 2013-05-13 ENCOUNTER — Ambulatory Visit (INDEPENDENT_AMBULATORY_CARE_PROVIDER_SITE_OTHER): Payer: Medicare HMO | Admitting: Family Medicine

## 2013-05-13 ENCOUNTER — Encounter: Payer: Self-pay | Admitting: Family Medicine

## 2013-05-13 ENCOUNTER — Other Ambulatory Visit: Payer: Self-pay | Admitting: Family Medicine

## 2013-05-13 VITALS — BP 110/60 | HR 72 | Temp 98.0°F | Resp 16 | Ht 72.0 in | Wt 191.0 lb

## 2013-05-13 DIAGNOSIS — Z0000 Encounter for general adult medical examination without abnormal findings: Secondary | ICD-10-CM

## 2013-05-13 DIAGNOSIS — Z79899 Other long term (current) drug therapy: Secondary | ICD-10-CM

## 2013-05-13 DIAGNOSIS — E039 Hypothyroidism, unspecified: Secondary | ICD-10-CM

## 2013-05-13 DIAGNOSIS — I10 Essential (primary) hypertension: Secondary | ICD-10-CM

## 2013-05-13 DIAGNOSIS — E785 Hyperlipidemia, unspecified: Secondary | ICD-10-CM

## 2013-05-13 DIAGNOSIS — R0989 Other specified symptoms and signs involving the circulatory and respiratory systems: Secondary | ICD-10-CM

## 2013-05-13 DIAGNOSIS — R011 Cardiac murmur, unspecified: Secondary | ICD-10-CM

## 2013-05-13 DIAGNOSIS — Z23 Encounter for immunization: Secondary | ICD-10-CM

## 2013-05-13 LAB — COMPLETE METABOLIC PANEL WITH GFR
ALBUMIN: 3.4 g/dL — AB (ref 3.5–5.2)
ALK PHOS: 47 U/L (ref 39–117)
ALT: 10 U/L (ref 0–53)
AST: 11 U/L (ref 0–37)
BUN: 16 mg/dL (ref 6–23)
CALCIUM: 9.1 mg/dL (ref 8.4–10.5)
CHLORIDE: 99 meq/L (ref 96–112)
CO2: 27 mEq/L (ref 19–32)
Creat: 1.11 mg/dL (ref 0.50–1.35)
GFR, Est African American: 75 mL/min
GFR, Est Non African American: 65 mL/min
Glucose, Bld: 91 mg/dL (ref 70–99)
POTASSIUM: 4.7 meq/L (ref 3.5–5.3)
SODIUM: 135 meq/L (ref 135–145)
TOTAL PROTEIN: 7.2 g/dL (ref 6.0–8.3)
Total Bilirubin: 0.3 mg/dL (ref 0.2–1.2)

## 2013-05-13 LAB — CBC WITH DIFFERENTIAL/PLATELET
BASOS ABS: 0 10*3/uL (ref 0.0–0.1)
Basophils Relative: 0 % (ref 0–1)
Eosinophils Absolute: 0.2 10*3/uL (ref 0.0–0.7)
Eosinophils Relative: 2 % (ref 0–5)
HEMATOCRIT: 31.8 % — AB (ref 39.0–52.0)
HEMOGLOBIN: 10.7 g/dL — AB (ref 13.0–17.0)
LYMPHS PCT: 17 % (ref 12–46)
Lymphs Abs: 1.3 10*3/uL (ref 0.7–4.0)
MCH: 26.2 pg (ref 26.0–34.0)
MCHC: 33.6 g/dL (ref 30.0–36.0)
MCV: 77.9 fL — ABNORMAL LOW (ref 78.0–100.0)
MONO ABS: 0.6 10*3/uL (ref 0.1–1.0)
MONOS PCT: 8 % (ref 3–12)
NEUTROS ABS: 5.5 10*3/uL (ref 1.7–7.7)
NEUTROS PCT: 73 % (ref 43–77)
Platelets: 414 10*3/uL — ABNORMAL HIGH (ref 150–400)
RBC: 4.08 MIL/uL — ABNORMAL LOW (ref 4.22–5.81)
RDW: 17 % — AB (ref 11.5–15.5)
WBC: 7.6 10*3/uL (ref 4.0–10.5)

## 2013-05-13 LAB — LIPID PANEL
CHOL/HDL RATIO: 2.6 ratio
CHOLESTEROL: 99 mg/dL (ref 0–200)
HDL: 38 mg/dL — ABNORMAL LOW (ref >39)
LDL CALC: 51 mg/dL (ref 0–99)
Triglycerides: 48 mg/dL (ref <150)
VLDL: 10 mg/dL (ref 0–40)

## 2013-05-13 MED ORDER — LEVOTHYROXINE SODIUM 175 MCG PO TABS: ORAL_TABLET | ORAL | Status: DC

## 2013-05-13 MED ORDER — AMLODIPINE BESYLATE 5 MG PO TABS: ORAL_TABLET | ORAL | Status: DC

## 2013-05-13 NOTE — Progress Notes (Signed)
Subjective:    Patient ID: Taylor Keller, male    DOB: 26-May-1935, 78 y.o.   MRN: [id number]  HPI Patient is here today for complete physical exam. He is concerned about mild disequilibrium. He is also having chronic cough for last 2-3 months. He denies any has reflux. He denies any fever. He denies hemoptysis. He does report mild dyspnea on exertion which is attributed to deconditioning. He also is having daily postnasal drip and sinus congestion. Otherwise he is doing well with no concerns. His colonoscopy was in 2012. The patient has had the shingles vaccine, Pneumovax 23, tetanus shot. He is due today for a PSA. He is also due for Prevnar 13. Past Medical History  Diagnosis Date  . Lumbar stenosis     L5  . Hypothyroidism   . Hyperlipidemia   . Hypertension   . Stroke   . ED (erectile dysfunction)    Past Surgical History  Procedure Laterality Date  . Adenotonsillectomy     Current Outpatient Prescriptions on File Prior to Visit  Medication Sig Dispense Refill  . aspirin 81 MG tablet Take 81 mg by mouth daily.      Marland Kitchen DIOVAN 320 MG tablet TAKE ONE TABLET BY MOUTH ONCE DAILY  90 tablet  0  . esomeprazole (NEXIUM) 40 MG capsule Take 40 mg by mouth daily before breakfast.      . hydrochlorothiazide (MICROZIDE) 12.5 MG capsule TAKE ONE CAPSULE BY MOUTH EVERY DAY  90 capsule  1  . simvastatin (ZOCOR) 20 MG tablet TAKE 1 TABLET BY MOUTH AT BEDTIME.  90 tablet  1  . tadalafil (CIALIS) 20 MG tablet Take 20 mg by mouth daily as needed for erectile dysfunction.       No current facility-administered medications on file prior to visit.   Allergies  Allergen Reactions  . Benazepril Cough   History   Social History  . Marital Status: Married    Spouse Name: N/A    Number of Children: N/A  . Years of Education: N/A   Occupational History  . Not on file.   Social History Main Topics  . Smoking status: Never Smoker   . Smokeless tobacco: Not on file  . Alcohol Use: Yes   Comment: Occasional wine  . Drug Use: No  . Sexual Activity: Not on file   Other Topics Concern  . Not on file   Social History Narrative  . No narrative on file   Family History  Problem Relation Age of Onset  . Cancer Sister     colon      Review of Systems  All other systems reviewed and are negative.       Objective:   Physical Exam  Vitals reviewed. Constitutional: He is oriented to person, place, and time. He appears well-developed and well-nourished. No distress.  HENT:  Head: Normocephalic and atraumatic.  Right Ear: External ear normal.  Left Ear: External ear normal.  Nose: Nose normal.  Mouth/Throat: Oropharynx is clear and moist. No oropharyngeal exudate.  Eyes: Conjunctivae and EOM are normal. Pupils are equal, round, and reactive to light. Right eye exhibits no discharge. Left eye exhibits no discharge. No scleral icterus.  Neck: Normal range of motion. Neck supple. No JVD present. No tracheal deviation present. No thyromegaly present.  Cardiovascular: Normal rate, regular rhythm and intact distal pulses.  Exam reveals no gallop and no friction rub.   Murmur heard. Pulmonary/Chest: Effort normal and breath sounds normal. No respiratory distress.  He has no wheezes. He has no rales. He exhibits no tenderness.  Abdominal: Soft. Bowel sounds are normal. He exhibits no distension and no mass. There is no tenderness. There is no rebound and no guarding.  Musculoskeletal: Normal range of motion. He exhibits no edema and no tenderness.  Lymphadenopathy:    He has no cervical adenopathy.  Neurological: He is alert and oriented to person, place, and time. He has normal reflexes. He displays normal reflexes. No cranial nerve deficit. He exhibits normal muscle tone. Coordination normal.  Skin: Skin is warm. No rash noted. He is not diaphoretic. No erythema. No pallor.  Psychiatric: He has a normal mood and affect. His behavior is normal. Judgment and thought content  normal.   right carotid bruit        Assessment & Plan:  1. Encounter for long-term (current) use of other medications - CBC with Differential - COMPLETE METABOLIC PANEL WITH GFR - Lipid panel - TSH - PSA, Medicare  2. Need for prophylactic vaccination against Streptococcus pneumoniae (pneumococcus) Patient received Prevnar 13 today in the office. - Pneumococcal conjugate vaccine 13-valent IM  3. Routine general medical examination at a health care facility Patient's cancer screening is up to date. I will check a PSA. I updated his immunizations. The remainder of his preventive care is up to date. 4. HTN (hypertension) Blood pressure is well controlled. Combing her changes in his current medications. 5. HLD (hyperlipidemia) Check fasting lipid panel. Given his history of stroke his goal LDL is less than 70 6. Unspecified hypothyroidism Check a TSH. 7. Carotid bruit - Carotid duplex; Future  8. Undiagnosed cardiac murmurs I believe the patient is developing aortic sclerosis. I will check a 2-D echo. I believe this may explain his carotid bruit. - 2D Echocardiogram without contrast; Future

## 2013-05-14 LAB — PSA, MEDICARE: PSA: 2.63 ng/mL (ref <=4.00)

## 2013-05-14 LAB — TSH: TSH: 4.173 u[IU]/mL (ref 0.350–4.500)

## 2013-05-15 LAB — IRON: IRON: 18 ug/dL — AB (ref 42–165)

## 2013-05-19 ENCOUNTER — Other Ambulatory Visit: Payer: Medicare HMO

## 2013-05-19 DIAGNOSIS — D649 Anemia, unspecified: Secondary | ICD-10-CM

## 2013-05-20 ENCOUNTER — Encounter: Payer: Self-pay | Admitting: Family Medicine

## 2013-05-20 LAB — FECAL OCCULT BLOOD, IMMUNOCHEMICAL
FECAL OCCULT BLOOD: NEGATIVE
Fecal Occult Blood: NEGATIVE
Fecal Occult Blood: NEGATIVE

## 2013-05-26 ENCOUNTER — Encounter (HOSPITAL_COMMUNITY): Payer: Medicare Other

## 2013-05-26 ENCOUNTER — Other Ambulatory Visit (HOSPITAL_COMMUNITY): Payer: Medicare Other

## 2013-06-03 ENCOUNTER — Other Ambulatory Visit: Payer: Self-pay | Admitting: Family Medicine

## 2013-06-05 ENCOUNTER — Ambulatory Visit (HOSPITAL_BASED_OUTPATIENT_CLINIC_OR_DEPARTMENT_OTHER): Payer: Medicare HMO | Admitting: Cardiology

## 2013-06-05 ENCOUNTER — Encounter: Payer: Self-pay | Admitting: Internal Medicine

## 2013-06-05 ENCOUNTER — Ambulatory Visit (HOSPITAL_COMMUNITY): Payer: Medicare HMO | Attending: Internal Medicine | Admitting: Radiology

## 2013-06-05 DIAGNOSIS — R011 Cardiac murmur, unspecified: Secondary | ICD-10-CM | POA: Insufficient documentation

## 2013-06-05 DIAGNOSIS — I6529 Occlusion and stenosis of unspecified carotid artery: Secondary | ICD-10-CM

## 2013-06-05 DIAGNOSIS — R0989 Other specified symptoms and signs involving the circulatory and respiratory systems: Secondary | ICD-10-CM

## 2013-06-05 NOTE — Progress Notes (Signed)
Carotid duplex complete 

## 2013-06-05 NOTE — Progress Notes (Signed)
Echocardiogram performed.  

## 2013-06-06 DIAGNOSIS — I6529 Occlusion and stenosis of unspecified carotid artery: Secondary | ICD-10-CM

## 2013-06-06 HISTORY — DX: Occlusion and stenosis of unspecified carotid artery: I65.29

## 2013-06-09 ENCOUNTER — Encounter: Payer: Self-pay | Admitting: Family Medicine

## 2013-06-09 DIAGNOSIS — I6529 Occlusion and stenosis of unspecified carotid artery: Secondary | ICD-10-CM | POA: Insufficient documentation

## 2013-06-21 ENCOUNTER — Other Ambulatory Visit: Payer: Self-pay | Admitting: Family Medicine

## 2013-07-16 ENCOUNTER — Ambulatory Visit (INDEPENDENT_AMBULATORY_CARE_PROVIDER_SITE_OTHER): Payer: Medicare HMO | Admitting: Family Medicine

## 2013-07-16 ENCOUNTER — Encounter: Payer: Self-pay | Admitting: Family Medicine

## 2013-07-16 VITALS — BP 144/68 | HR 72 | Temp 98.1°F | Resp 16 | Ht 71.0 in | Wt 185.0 lb

## 2013-07-16 DIAGNOSIS — L255 Unspecified contact dermatitis due to plants, except food: Secondary | ICD-10-CM

## 2013-07-16 MED ORDER — SIMVASTATIN 20 MG PO TABS: ORAL_TABLET | ORAL | Status: DC

## 2013-07-16 MED ORDER — PREDNISONE 10 MG PO TABS: 20.0000 mg | ORAL_TABLET | Freq: Every day | ORAL | Status: DC

## 2013-07-16 MED ORDER — METHYLPREDNISOLONE ACETATE 40 MG/ML IJ SUSP
40.0000 mg | Freq: Once | INTRAMUSCULAR | Status: AC
  Administered 2013-07-16: 40 mg via INTRAMUSCULAR

## 2013-07-16 NOTE — Patient Instructions (Signed)
Depo Medrol shot given Start prednisone tomorrow, take it in the morning as prescribed F/U as previous with Dr. Dennard Schaumann

## 2013-07-16 NOTE — Addendum Note (Signed)
Addended by: Sheral Flow on: 07/16/2013 09:28 AM   Modules accepted: Orders

## 2013-07-16 NOTE — Progress Notes (Signed)
Patient ID: Taylor Keller, male   DOB: Mar 02, 1935, 78 y.o.   MRN: [id number]   Subjective:    Patient ID: Taylor Keller, male    DOB: [birth year], 78 y.o.   MRN: [id number]  Patient presents for [facility]  patient here with concern for poison oak poison ivy exposure. He was out in his yard walking his dog on Saturday over the weekend he made his a rash pop up between his fingers and then it progressed to his face. Is very itchy in nature. He's been using calamine lotion and taking Benadryl with no relief. He's had some itching at the corner of his eyes but is been no change in his vision is having no difficulty breathing or swallowing. He's not had any cough or chest pain.    Review Of Systems:  GEN- denies fatigue, fever, weight loss,weakness, recent illness HEENT- denies eye drainage, change in vision, nasal discharge, CVS- denies chest pain, palpitations RESP- denies SOB, cough, wheeze MSK- denies joint pain, muscle aches, injury        Objective:    BP 144/68  Pulse 72  Temp(Src) 98.1 F (36.7 C) (Oral)  Resp 16  Ht 5\' 11"  (1.803 m)  Wt 185 lb (83.915 kg)  BMI 25.81 kg/m2 GEN- NAD, alert and oriented x3 HEENT- PERRL, EOMI, non injected sclera, pink conjunctiva, MMM, oropharynx clear Neck- Supple, no LAD Skin- Web spaces of right hand between 2-4th digits- erythema in linear pattern, raised areas with scaly peeling skin, bilateral cheeks- erythema with some flaky skin, rough texture, few excoriations, no  Vesicles, no papules noted, left hand mild erythema lateral side of 3rd digit, no new lesions on back multiple, tiny hemangiomas and seborrheic keratoses EXT- No edema Pulses- Radial 2+        Assessment & Plan:      Problem List Items Addressed This Visit   None    Visit Diagnoses   Dermatitis due to plants, including poison ivy, sumac, and oak    -  Primary    Depo Medrol given due to face invovlement, start prednisone burst tomrrow    Relevant  Medications       predniSONE (DELTASONE) tablet       Note: This dictation was prepared with Dragon dictation along with smaller phrase technology. Any transcriptional errors that result from this process are unintentional.

## 2013-08-06 DIAGNOSIS — I219 Acute myocardial infarction, unspecified: Secondary | ICD-10-CM

## 2013-08-06 HISTORY — DX: Acute myocardial infarction, unspecified: I21.9

## 2013-08-06 HISTORY — PX: CORONARY ANGIOPLASTY WITH STENT PLACEMENT: SHX49

## 2013-08-21 NOTE — Procedures (Signed)
[facility]  Electroencephalogram  NAME:  Andre [name], Andre [name]   DATE: 08/21/2013  EEG#: [id number]  DOB: January 16, 1936  MR#    [id number]  ROOM:  4ICU  ACCT#  [id number]  SEX: M  REFERRING PHYSICIAN: Christel Mormon          HISTORY:  A 78 year old patient with cerebral hypoxic injury due to ventricular  fibrillation who is status post Code ICE and is cooling phase at the time of  the study.  Temperature is 93 degrees Fahrenheit.  The propofol is at 51  mcg/kg/minute.  The patient is also on a variety of other medications  including Levetiracetam.    TECHNICAL DESCRIPTION:  A 16-channel digital EEG was performed on this patient according to 10/20  international system of electrode placement for a total duration of 27  minutes.  The background rhythm throughout this recording was burst  suppression pattern.  The overall background rhythm was of very low amplitude  and therefore higher sensitivity of 2 microvolts per millimeter was applied to  interpret this study.  The suppressions were lasting anywhere between 3 to 5  seconds and the bursts were lasting anywhere between 2 to 8 seconds.  The  bursts consisted of alpha frequency activities and occasionally higher theta  frequency activities.  Hyperventilation was not performed.  Photic stimulation  was performed and did not elicit a well-developed driving response.    EEG DIAGNOSIS:  Dysrhythmia grade 3, generalized.    IMPRESSION:  This EEG shows burst suppression pattern which is most likely due to the  medication effect of propofol.  We do see this pattern in the case of cerebral  hypoxic injury as well; however, with the presence of propofol and currently  status post Code ICE it is difficult to distinguish whether this is secondary  to structural damage to the brain or whether it is iatrogenically induced due  to Code ICE and the presence of propofol.  Clearly, based on this EEG, the  patient was not having seizure activities on the EEG.  I strongly  recommend  repeating the EEG once the Code ICE is completed and once the patient's core  body temperature has been normalized.      ___________________  Charlesetta Ivory MD  Dictated By: .   JMB  D:08/21/2013 17:20:39  T: 08/21/2013 17:53:18  [id number]  Electronically Authenticated and Edited by:  Charlesetta Ivory, MD On 08/25/2013 08:13 AM EDT

## 2013-08-21 NOTE — Consults (Signed)
[facility]  CONSULTATION REPORT  NAME:  Andre [name]  SEX:   M  ADMIT: 08/21/2013  DATE OF CONSULT: 08/21/2013  REFERRING PHYSICIAN:    DOB: 09/22/1935  MR#    [id number]  ROOM:  I413  ACCT#  [id number]    cc: Riley Churches MD, Micki Riley MD    REQUESTING PHYSICIAN:  Dr. Riley Churches, Dr. Huntley Dec Tsuchitani.      BRIEF HISTORY OF PRESENTATION:  The patient is a 78 year old white male who was brought to the emergency room  following ventricular fibrillation cardiac arrest.  Code I-Silver was called  at around 8:30 a.m.  I responded to the call by discussing the case with Dr.  Micki Riley.  At that time, according to her evaluation, the patient was  intubated, hemodynamically stable, had to return to spontaneous circulation  after being defibrillated times 2 in the field by the EMS.  He had presented  with shaking with a King's airway.  There were concerns about some bleeding  from the mouth and from the ET tube.  After my initial evaluation, the plan  was that I would take care of him when he would come to the ICU. Subsequently  about 15 to 20 minutes later, I got another call from the ER where the patient  was having a difficult time being ventilated.  The respiratory therapy staff  was having a hard time bagging him and the patient had been taken to CT scan  of his head and he was being wheeled back.  According to Dr. Hervey Ard, at  that time, a chest x-ray has already been done which showed significant  bilateral infiltrates and she was concerned about the significant difficulty  in bagging him.  I decided to go down to the emergency room to take a look at  the patient at this point.  The story I had heard so far was that the patient  is a 78 year old male who was visiting from out of town.  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  He had sudden loss of consciousness and CPR was given by a   bystander. When the EMS acquired, he was found to be in ventricular  fibrillation rhythm and he had to be shocked times 3 with epinephrine and  amiodarone given during the initial resuscitative attempt.  He had King's  airway placed by the EMS personnel and he also had 2 functional interosseous  lines placed during that period.  When he was brought to the emergency room  here in [place], he had return of spontaneous circulation.  His vital signs  appeared stable.  Quickly, Dr. Hervey Ard had changed at the King's airway to  a regular ET tube 7.5 cm without any difficulty.  She did not encounter any  problems or any bleeding, but subsequently the patient started having a lot of  bright red blood down from the ET tube.  When I arrived to the emergency room,  he was just being wheeled in from CT scanner.  His blood pressure appeared to  be around 110s.  He was mildly tachycardic at around 106 per minute.  He  clearly had some fresh blood around his lips.  There was evidence of trauma to  the tongue in the form of possibly a bite.  There was some bright red blood in  the oropharynx around the ET tube.  He  was also somewhat clamped down and  biting down, although he had received a dose of vecuronium for his CT scan.  I  quickly listened to his chest and heard multiple rales, rhonchi all over  diffusely.  There was a lot of conducted sounds.  I took a quick look with the  intubating scope down the ET tube and saw bright red blood, not a whole lot,  large amount,  lining outside the tube along the trachea as well as within the  trachea and the major bronchi.  The ET tube was in good position about 3 cm  above the carina.  There were no obvious signs of injury to the  tracheobronchial tree.  I did not see anything obvious in the oropharynx.  At  this point, I proceeded to take a suction catheter since he did not have  suction in the intubating scope and suctioned him out several times through   the ET tube and lavaged him with a bit of ice cold saline.  That seemed to  settle down his tube and we were able to bag him nicely.  It seems like he may  have dislodged some of the clots.  Immediately his sats came up from 71 to 95  with bagging and his color seemed to start improving.  At this time, he  appeared medically stable.  Code ice had been called.  He was felt to be a  good candidate for hypothermia protocol and the other team members including  PICC line team were at the bedside attempting to get to him.  At this time I  walked up to the unit to take care of further issues and waited for him to  come up to the unit.  Cardiology had been informed.  The patient was going to  be seen by Dr. Renaee MundaAdler and possible decision to do a cath was to be entertained  after he had been evaluated.  CT scan briefly did not show any signs of  intracranial hemorrhage and the  CT of the neck was also not suggestive of any  acute injury to the neck.  I do not have any past medical history on this  patient.      ALLERGIES:  THERE ARE NO ALLERGIES.      MEDICATIONS:   No home medication available.      FAMILY HISTORY:  No family history is available.    SOCIAL HISTORY:  We do not know whether he is a smoker or an alcohol abuser.      PAST MEDICAL AND SURGICAL HISTORY:      There is no history of any previous injuries or surgeries available.    REVIEW OF SYSTEMS:  Unobtainable.    PHYSICAL EXAMINATION:  GENERAL:  The patient is intubated.  He is sedated with propofol.  VITAL SIGNS:  Stable.  Blood pressure was 110S by 50s with a MAP just above  60.  He was tachycardic initially in 110s, but when I saw him later he had  settled down into the 70s sinus rhythm with right bundle branch block pattern.  EKG confirmed that no acute ST-T changes noted on the EKG.  His blood pressure  on the ART line was in low 100s.  He was being considered to start on  Levophed.  He had received 2 liters of cold saline at the time of this  dictation.     CHEST:  Appeared there were no obvious injuries to the chest  wall.  Coarse  rhonchi diffusely.  HEENT/NECK:  There were no abrasions on the neck HEENT area.  No bleeding from  the nose or ears, but there was bright red blood at the oropharynx at the lips  which appears fresh.  There was a clear hematoma in the tongue at the tip of  the tongue, probably from the bite.  CARDIAC:  Regular rate and rhythm.  ABDOMEN:  Soft, nontender, nondistended.  Bowel sounds are well heard.  EXTREMITIES:  No edema, cyanosis or clubbing.    PERTINENT BLOOD WORK:  WBC count is 20,200, hemoglobin is 10.5, hematocrit is 35.1, platelet count is  474,000.  Amylase is 55.  Ionized calcium is 4.8.  Sodium is 134, potassium is  3.7, chloride is 98, carbon dioxide is 20, glucose is 250, BUN is 20,  creatinine is 1.7.  AST is 108, ALT is 88, alkaline phosphatase is 66,  bilirubin is 0.3.  Total protein is 7.8, albumin is 2.4.  Creatinine  phosphokinase is 124.  CPK MB is 2.6.  Relative index is 2.1.  Lactic acid  level is 8.2.  Lipase is 151.  Magnesium is 2.5.  Phosphorus is 5.5.  Troponin  is 0.043.  ABG showed pCO2 of 24.3.  Chest x-ray showed endotracheal tube to  be in satisfactory position with worsening airspace disease.    IMPRESSION:  1.  Ventricular fibrillation cardiac arrest.  2.  Acute respiratory failure secondary to #1.  3.  Pulmonary vascular congestion related to poor perfusion.  4.  Acute renal failure.  5.  No other history available.    PLAN:  The patient will be placed on hypothermia protocol.  All inclusion  criteria has met.  Exclusion criteria are reviewed and he is a good candidate  for hypothermia protocol.  He will be placed on Levophed.  We will go ahead  and place him on propofol as well as use were vecuronium for shivering that is  already visible.  I will continue to monitor him closely in the ICU.  We will  give him another liter to a liter and a half of cold normal saline.  I suspect   a lot of what we are seeing on the x-ray is related to poor perfusion related  vascular congestion which should ease up in due course as long as we can  maintain a perfusion.  I also feel giving him a little bit of extra PEEP and  maybe reducing his pressures would help.  I would hold off on giving him Lasix  at this point, but would not hesitate using it if needed.  I will be replacing  all his electrolytes.  We will monitor his pressure.  He will be placed on  Levophed.  He will be placed on Keppra as protocol and will follow the  hypothermia protocol.    Total time spent in direct patient care excluding any procedure is 75 minutes  of critical care time.      ___________________  Claudette Lawsajnish Greg Eckrich MD  Dictated By:.   rm  D:08/21/2013 15:01:13  T: 08/21/2013 15:42:45  [id number]  Electronically Authenticated and Edited by:  Claudette Lawsajnish Tanija Germani, M.D. On 08/22/2013 01:35 PM EDT

## 2013-08-21 NOTE — Procedures (Signed)
[facility]  Electroencephalogram  NAME:  [name], Andre   DATE: 08/21/2013  EEG#: [id number]  DOB: 10/04/1935  MR#    [id number]  ROOM:  4ICU  ACCT#  [id number]  SEX: M  REFERRING PHYSICIAN: MOHAMMAD R ABOLHASSANI          HISTORY:  A 78-year-old patient with cerebral hypoxic injury due to ventricular  fibrillation who is status post Code ICE and is cooling phase at the time of  the study.  Temperature is 93 degrees Fahrenheit.  The propofol is at 51  mcg/kg/minute.  The patient is also on a variety of other medications  including Levetiracetam.    TECHNICAL DESCRIPTION:  A 16-channel digital EEG was performed on this patient according to 10/20  international system of electrode placement for a total duration of 27  minutes.  The background rhythm throughout this recording was burst  suppression pattern.  The overall background rhythm was of very low amplitude  and therefore higher sensitivity of 2 microvolts per millimeter was applied to  interpret this study.  The suppressions were lasting anywhere between 3 to 5  seconds and the bursts were lasting anywhere between 2 to 8 seconds.  The  bursts consisted of alpha frequency activities and occasionally higher theta  frequency activities.  Hyperventilation was not performed.  Photic stimulation  was performed and did not elicit a well-developed driving response.    EEG DIAGNOSIS:  Dysrhythmia grade 3, generalized.    IMPRESSION:  This EEG shows burst suppression pattern which is most likely due to the  medication effect of propofol.  We do see this pattern in the case of cerebral  hypoxic injury as well; however, with the presence of propofol and currently  status post Code ICE it is difficult to distinguish whether this is secondary  to structural damage to the brain or whether it is iatrogenically induced due  to Code ICE and the presence of propofol.  Clearly, based on this EEG, the  patient was not having seizure activities on the EEG.  I strongly  recommend  repeating the EEG once the Code ICE is completed and once the patient's core  body temperature has been normalized.      ___________________  Keghan Mcfarren MD  Dictated By: .   JMB  D:08/21/2013 17:20:39  T: 08/21/2013 17:53:18  [id number]  Electronically Authenticated and Edited by:  Seara Hinesley, MD On 08/25/2013 08:13 AM EDT

## 2013-08-21 NOTE — ED Provider Notes (Signed)
[facility]  EMERGENCY DEPARTMENT TREATMENT REPORT  NAME:  Andre [name]  SEX:   M  ADMIT: 08/21/2013  DOB:   05-May-1935  MR#    [id number]  ROOM:  [id number]  TIME DICTATED: 09 30 AM  ACCT#  [id number]        I hereby certify this patient for admission based upon medical necessity as  noted below:     TIME OF EVALUATION:  0830    PRIMARY CARE PHYSICIAN:  Unknown.    CHIEF COMPLAINT:  Status post arrest.    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 EMS was called.  The patient was given one 300 mg dose of  amiodarone, 2 epinephrines and 3 shocks.  Return of spontaneous circulation  occurred.  The patient had [facility] airway placed by EMS and was brought to the   Emergency  Department for evaluation.  The presenting rhythm for EMS was V-fib and   subsequently on return of circulation EMS did an 12 lead EKG showing a  right bundle branch block.  EMS was able to obtain a history of hypertension,  high cholesterol, CVA, hypothyroidism and prostate issues.  Medications he   takes at home were  not available.  The wife has been notified via the church member and is on her  way from [place], [place].    Unable to perform an adequate HPI and ROS within the constraints imposed by  the urgency of the patient's clinical condition.     PAST MEDICAL HISTORY:  As stated above, hypertension, high cholesterol, hypothyroidism, prostate,   CVA.    SOCIAL HISTORY:  Lives with [place] in [place].    FAMILY HISTORY:  Unknown.    CURRENT MEDICATIONS:  Unknown.    ALLERGIES:  NO KNOWN DRUG ALLERGIES.    PHYSICAL EXAMINATION:  VITAL SIGNS:  Blood pressure 155/61, pulse 83, respirations 15, O2 saturation  with what is 98% on vent and this was confirmed through blood gas.  GENERAL APPEARANCE:  The patient is unresponsive, appears well developed and  well nourished.   HEENT:  Eyes:  The conjunctivae are clear.  The lids are normal.  The pupils  are equal.  Mouth/throat:  The patient does demonstrate a tongue laceration.  NECK:  Supple, nontender, symmetrical, no masses or JVD, trachea midline,  thyroid not enlarged, nodular, or tender.   LYMPHATICS:  No cervical or submandibular lymphadenopathy palpated.   RESPIRATORY:  Equal breath sounds via bagging through [facility] airway.  CARDIOVASCULAR:  The heart is tachycardic without murmurs, gallops, rubs or  thrills.  DP pulses 2+ and equal bilaterally.  No peripheral edema or  significant varicosities.   VASCULAR:  Calves are soft.  CHEST:  Symmetrical without masses.    GASTROINTESTINAL:  The abdomen was soft.  No abdominal or inguinal masses  appreciated by inspection or palpation.   SKIN:  The patient has an abrasion noted to the forehead.  NEUROLOGICAL:  The patient is unresponsive but breathing spontaneously.    CONTINUATION BY Tana Conch, MD:     RESULTS:  EKG on arrival showing a sinus rhythm with a right bundle branch block.  X-ray  of the chest, bilateral multifocal air space disease, suggesting  bronchopneumonia read by Dr. Noel Gerold.  Blood gas:  pH 7.236, pCO2 43, pO2 91,  100% FIO2.  CBC with a white count of   20.2, hemoglobin  10.5, platelets 474,  INR 1.4.  Urine:  Large blood, otherwise negative for nitrates and  leukoesterase. Ionized calcium 4.8.  CT head showed cerebral atrophy,  nonspecific white matter changes, not unusual for age, no acute abnormality,  read by Dr. Noel Geroldohen.  Amylase and lipase within normal limits.  CMP with sodium  134, CO2 20, glucose 250, creatinine 1.7, magnesium 2.5.  Phosphorus 5.5.  Troponin 0.043.  LFTs with an AST of 108, ALT  88.  CT C-spine:  Degenerative  changes and no acute bony abnormality, abnormal density both lung apices.  Chest x-ray same day shows bilateral airspace disease, suggesting pneumonitis,  ET tube in place.  CPK profile within normal limits. Lactate 8.2.      ER COURSE:  The patient presented via EMS with a King airway in place, status post a  ventricular fibrillation arrest, after which he was defibrillated 3 times, got  2 rounds of epinephrine and one dose of amiodarone.  He had a King airway in   his  oropharynx.  He had IO in his right tibia. On arrival, the patient was  intubated by myself, we initiated the hypothermia protocol and had discussed  the patient with intensivist and the cardiology nurse practitioner.  The  patient was taken for CT of head and C-spine since he did collapse and has a  small abrasion to his forehead.    After intubation, the patient was still unresponsive but was breathing  spontaneously.  I started IV propofol for sedation,  respiratory still having   difficulty  sinking the bagging with respirations, so patient was given IV vecuronium.  He  received etomidate and succinylcholine for RSI.  He was unresponsive on   arrival, however,  was clenching his jaw and was breathing on his own so RSI drugs were needed.    IV access was  established.  The patient was noted to have bloody sputum suctioned out by  respiratory who noted difficult compliance with bagging.  Tube position was  rechecked, lungs seemed clear.  X-ray reviewed. The patient either with  pneumonia versus post-arrest edema.  Dr. Boone Masterhawan, intensivist, has come down to  see the patient.  He has done some additional suctioning and respiratory now  reports easier to bag.  Patient's nurse, Ma HillockKen Rogers, noted in IBEX that the   patient  was apneic.  He was having spontaneous respirations on arrival with a King  airway in place, being bagged by EMS.  We have consulted cardiology.  Dr.  Eliezer ChampagneAbolhassani has been contacted who has accepted this patient to the ICU.  Hypothermia protocol was initiated after consultation with Dr. Boone Masterhawan, who has  seen the patient.  Dr. Renaee MundaAdler cardiology has been down to see the patient as   well.    PROCEDURE NOTE:   Intubation note:  Indication is unresponsive, status post cardiac arrest.  I  did use IV etomidate and succinylcholine due to the fact that the patient was  having spontaneous respirations, was jaw was clenched and drugs were needed   for  intubation.  Using the video laryngoscope, the cords were visualized through  which a bougie was passed, over which a 7.5 ET tube was passed.  There was no  difficulty passing the tube.  The bougie was removed.  The balloon was  inflated after which there is positive end-tidal CO2 color change with equal  breath sounds bilaterally.      The patient's wife has so far not arrived so I  have not gotten to speak to   her, so the history is largely obtained from what  paramedics were able to tell us.    DISPOSITION AND PLAN:  The patient will be dispositioned to ICU in serious condition.     DIAGNOSES:  1.  Status post ventricular fibrillation arrest.    2.  Possible pneumonia versus pulmonary edema.    3.  Abrasion to the head.    Critical care time, excluding procedures, but including direct patient care,  reviewing medical records, evaluating results of diagnostic testing,  discussions with family members, and consulting with physicians:  60 minutes.      ___________________  Judeen HammansSara N Tsuchitani MD  Dictated By: Dayton ScrapeNichole V. Rice, PA-C    My signature above authenticates this document and my orders, the final  diagnosis (es), discharge prescription (s), and instructions in the [facility] record.  Nursing notes have been reviewed by the physician/mid-level provider.    If you have any questions please contact [phone].    ST  D:08/21/2013 09:30:00  T: 08/21/2013 10:26:31  [id number]  Electronically Authenticated and Edited by:  Tana ConchSARA N. TSUCHITANI, MD On 08/25/2013 08:42 PM EDT

## 2013-08-21 NOTE — Consults (Signed)
[facility]  CONSULTATION REPORT  NAME:  Andre [name]  SEX:   M  ADMIT: 08/21/2013  DATE OF CONSULT: 08/21/2013  REFERRING PHYSICIAN:    DOB: 21-Dec-1935  MR#    [id number]  ROOM:  [id number]  ACCT#  [id number]    cc: Micki Riley MD    REQUESTING PHYSICIAN:  Dr. Huntley Dec Tsuchitani the emergency room physician at [facility].    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  The  patient is going through the hypothermia protocol.    HISTORY OF PRESENT ILLNESS:  The patient is intubated and sedated and comatose and cannot provide any  history.  The history was obtained by speaking with the ICU team and also by  reviewing the patient's chart.  He is a 78 year old gentleman with past  medical history significant for hypertension, dyslipidemia, hypothyroidism,  prior stroke which according to his wife.  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 Apparently, a bystander initiated the CPR  immediately and they called EMS.  According to the bystander the EMS arrived  within 3 minutes and started to resuscitate the patient.  They found the  patient to be in ventricular fibrillation.  The patient was given 300 mg dose  of amiodarone, 2 doses of epinephrine and was shocked 3 times until the  spontaneous circulation returned.  The patient was put in [facility] airway  placement and was brought to the emergency room at [facility] for further treatment  options.  Apparently, the underlying rhythm was ventricular fibrillation and  subsequently right bundle branch block.  The patient had a head CT without  contrast after coming to our emergency room at 8:52 a.m.  It showed cerebral  atrophy and nonspecific white matter changes but no acute intracranial   process.  I have reviewed the films as well as the radiological report.  The  patient also had a chest x-ray done which showed no pneumothorax and showed  that endotracheal tube is in satisfactory position.  The patient also had a CT  of the cervical spine done which showed degenerative changes with no acute  bony abnormalities.  I have reviewed the films as well as radiological report.  The patient also had some laboratory studies done including basic metabolic  panel which was fairly unremarkable with the exception of calcium of 7.6.  The  patient's magnesium and phosphorus levels were normal.  CBC with differential  was significant for leukocytosis of 20,000 and anemia with hemoglobin of 10.5  and hematocrit of 35.1.  Hypothermia protocol was initiated at about 9:00 a.m.  per our protocol and reached the goal at 12:30 p.m.  The hypothermia protocol  should continue for 24 hours and the rewarming protocol well implemented as  well and the patient will reach the goal of the rewarming protocol by midnight  on 08/23/2013.  The patient had an EEG done earlier today.  The patient's core  temperature was 93 degrees Fahrenheit as a part of hypothermia protocol.  The  EEG showed a burst suppression pattern which is most likely due to a  combination effective propofol that the patient is on as well as hypothermia  which is induced iatrogenically to decrease the  hypoxic brain injury.  Whether there are  underlying cerebral brain injuries which are contributing to  burst suppression pattern remains to be seeing him once the patient's  hypothermia protocol is off and once he is off the propofol.    REVIEW OF SYSTEMS:  It is not obtainable since the patient is comatose.    PAST MEDICAL HISTORY:  As outlined above.    SOCIAL HISTORY:  [place]  At this time, the smoking  habits or drinking habits or any other illicit drugs  are not available to us.    FAMILY HISTORY:   Not available to us at this time.    ALLERGIES:  NKDA.    MEDICATIONS:  As documented in the EMR and I reviewed them and does include propofol drip.    PHYSICAL EXAMINATION:  VITAL SIGNS:  The patient's temperature is 92.2, pulse of 53, respirations 12,  blood pressure 102/54.  The patient has a regular pulse.  GENERAL APPEARANCE:  A well-developed and well-nourished man who is intubated  and comatose and is under the influence of hypothermia protocol as well as on  a propofol drip.  Overall the neurological exam is very limited due to the  current ongoing hypothermia protocol as well as the fact that he is on   propofol.  NEUROLOGICAL: He has a normocephalic and atraumatic head.  His pupils are  reacting to light bilaterally.  The brain stem reflexes including gag and  cough reflexes and doll's eyes reflexes are suppressed  most likely due to  hypothermia protocol as well as due to propofol.  Deep tendon reflexes are not  elicitable due to propofol as well as hypothermia protocol.  There are no  pathological reflexes.  The rest of the neurological assessment could not be  done at this time due to active hypothermia protocol, which is induced   purposefully to  preserve the brain and also due to the effect of propofol.    ASSESSMENT AND PLAN:  The patient is a 78 year old patient who had cardiac arrest earlier today.  It  seems to me from the history that CPR was initiated promptly and the EMS  personnel were on the scene within 3 minutes and started resuscitating the  patient, but still it is not so clear from that how long it took for the  patient to regain his circulation back.  Obviously, the patient needed  to be shocked 3 times before the circulation returned.  At this stage, given  the hypothermia protocol as well as given the fact that he is on propofol and  also given the fact that it is very early since the cerebral anoxic injury  occurred, it is not feasible to make a good prognostic guess regarding the   patient's long-term neurological recovery.  His EEG also shows changes that we  see in the case of hypothermia as well as in the case of propofol, which is  presence of burst suppression pattern.  Clinically, there has not been any  evidence of seizures and obviously in the EEG we do not see any seizure  activities but is obviously could be significantly limited due to hypothermia  protocol as well as the propofol.  The presence of burst suppression pattern  could be due to the medication effect of propofol and it could be due to  hypothermia as well; however, it is important to mention that burst  suppression pattern is a cardinal feature of anoxic brain injury as well.  My  intention  is to let the ICU team continue the hypothermia protocol and  continue all the supportive care that the patient needs in this case and once  the rewarming process started and once the patient reached the goal of the  rewarming process after completing the hypothermia protocol, we will  reevaluate the patient from the neurological standpoint by doing a new   detailed  neuro exam on him and also by repeating the EEG.  Depending on the clinical  picture and also the EEG results, we may have to also repeat the head CT.    Thank you so much for the consultation.  We will follow the patient closely.      ___________________  Charlesetta Ivory MD  Dictated By:.   [facility]  D:08/21/2013 19:31:11  T: 08/21/2013 95:28:41  [id number]  Electronically Authenticated and Edited by:  Charlesetta Ivory, MD On 08/25/2013 08:11 AM EDT

## 2013-08-25 NOTE — Procedures (Signed)
[facility]  Cardiac Catheterization  NAME:  Andre Bell  CATH PHYSICIAN: Curlene Dolphin M.D.  REF PHYSICIAN:   SEX:   M  DATE: 08/25/2013  DOB: 02-Feb-1936  LOCATION: 4ICU  CATH #:   MR#    [id number]  ACCT#  [id number]    cc: Mike Craze MD    HISTORY:  The patient is a 78 year old visiting from out of town who had a witnessed  cardiac arrest.  He did receive bystander CPR.  Presenting rhythm by EMS was  VFib.   He was shocked in the field, admitted to the ICU without acute  ischemic EKG changes and preserved left ventricular function on  echocardiogram.  The patient underwent therapeutic hypothermia by protocol and  was subsequently extubated with intact neurologic function.  He was  reintubated shortly thereafter with respiratory distress, with evidence of an  acute myocardial infarction with lateral ST depression and significantly  elevated cardiac enzymes acutely arising in the setting of his reintubation,  also with cardiogenic shock and requiring vasopressor support.  The patient's  course was complicated by hemoptysis, which on bronchoscopy was thought to be  due to minor airway trauma and hemoptysis resolved without recurrence.  In  that setting the patient was brought to the cath lab, intubated on Levophed  for blood pressure support.  Consent was obtained from the patient's wife.    PROCEDURES PERFORMED:  1.  Selective bilateral coronary angiography.  2.  Left heart catheterization and left ventriculography.  3.  Complex coronary intervention to the LAD.  4.  Complex coronary intervention to left circumflex.    FINDINGS:  1.  The left main coronary artery is a long medium caliber vessel.  There is  no angiographic evidence of discrete stenosis.  2.  The LAD is a small caliber vessel.  The LAD is diffusely diseased with  heavy calcium throughout the proximal and mid portions of the vessel.  There  is a focal 95% stenosis in the mid LAD at the takeoff of the diagonal branch.  There are 3  small diagonal branches.  3.  The left circumflex is a large caliber vessel.  There is ostial and  proximal disease throughout a moderately calcified and highly tortuous  proximal left circumflex with a tubular 95% stenosis in the mid left  circumflex.  The vessel beyond that is a large caliber vessel which derives a  medium-sized first obtuse marginal branch and a smaller lateral  limb branch  from that obtuse marginal.  4.  The RCA is a medium caliber dominant vessel.  There is diffuse disease in  the ostial, proximal and early mid RCA with sequential 70% stenoses.  There is  TIMI 3 flow throughout the RCA.  There is a medium sized posterior descending  artery and 2 small posterolateral branches.  5.  Successful complex intervention to the LAD with a 32 mm Promus Premiere  drug-eluting stent tapered from 3.0 to 2.6 mm.  6.  Successful complex intervention to the ostial and proximal left circumflex  with a 28 mm Promus Premiere drug-eluting stent dilated to 3 mm.    HEMODYNAMICS:  LVEDP is 26.  There is no gradient on pullback across the aortic valve.  For  further details of hemodynamics, please see separate cath lab documentation.    LEFT VENTRICULOGRAPHY  (RAO 30 DEGREES):  Estimated LVEF is 55%.  There is moderate mid to apical anterior hypokinesis  and the inferior wall is hyperdynamic.  There  is no appreciable mitral  regurgitation.    PROCEDURAL NARRATIVE:  The patient was prepped and draped in sterile fashion.  The right wrist was  anesthetized with 1% lidocaine.  Right radial artery was accessed with a  6-French hydrophilic sidearm sheath.  Selective left coronary angiography was  performed with a 6-French Tiger catheter.  Selective right coronary  angiography was performed with a 6-French Tiger catheter.  A 6-French Tiger  catheter was used to cross the aortic valve, measure left ventricular  pressures, and perform pullback across the aortic valve.  The 6-French Tiger  catheter was also used to perform  left ventriculography with reduced contrast  load due to the elevated LVP and to minimize contrast exposure, but injection  of 10 mL was adequate to visualize the left ventricle and assess left  ventricular function.    INTERVENTION:  IV bivalirudin was administered for anticoagulation.  The left coronary artery  was engaged with a 6-French XB 3.0 guide catheter.  Cougar XT guidewire was  advanced into the obtuse marginal branch and a run-through wire was advanced  as a buddy wire.  The left circumflex was dilated with serial inflations of a  2.5 x 12 mm Apex balloon to 8 to 10 atmospheres.  A 3.0  x 28 mm Promus  Premier drug-eluting stent was then advanced, but would not cross the lesion.  The circumflex was then dilated with a 3  x 12 mm NC Quantum Apex serial  inflations to 12 to 14 atmospheres.  Again, there was much difficulty  advancing the stent.  Ultimately using GuideLiner support the stent was  successfully delivered and deployed at 15 atmospheres.  The stent was then  postdilated with serial inflations of a 3  x 12-mm NC Trek balloon to 14 to 16  atmospheres.  Post-intervention, there is no residual stenosis in the left  circumflex and TIMI 3 flow.  It should be noted that the stent was placed at  the ostium of left circumflex with care to precisely deliver this stent.  Before post-dilating the ostial left circumflex stent a Cougar XT guidewire  was advanced into the LAD to protect this vessel.  Post-intervention, there  was no apparent plaque shift or injury to the left main or ostial LAD.    Attention was then turned to the LAD.  A Cougar XT guidewire was advanced into  the distal LAD.  The LAD was dilated with a 2.5 x 12 mm Apex balloon.  Serial  inflations to 8 to 10 atmospheres.   The  vessel was then dilated with a 2.5 x  20 mm NC Quantum Apex with serial inflations to 14 to 16 atmospheres.  A 2.5 x  32 mm Promus Premier drug-eluting stent was difficult to advance.  A  GuideLiner was used for  additional support and the stent was ultimately  delivered and successfully deployed at 15 atmospheres.  The proximal  two-thirds of the stent was then post-dilated with serial inflations of a 3  x  12 mm NC balloon at 14 to 16 atmospheres.  Post-intervention, there was no  residual stenosis in the LAD with TIMI 3 flow in the LAD.  There is TIMI 3  flow down 3 small jailed diagonal branches as well as jailed septal branches.    COMPLICATIONS:  None.    ESTIMATED BLOOD LOSS:  Minimal.    HEMOSTASIS:  Right radial artery TR band.    ASSISTANTS:  None except usual cath lab staff.  SPECIMENS OBTAINED:  None.    FLUOROSCOPY TIME AND CONTRAST USED:    Please see separate cath lab documentation.    ASSESSMENT AND PLAN:  This is a 78 year old male with cardiogenic shock and non-ST-elevation  myocardial infarction after presenting with witnessed cardiac arrest.  The  patient is now successfully status post complex 2-vessel intervention with  drug-eluting stents.  He was actually doing better hemodynamically throughout  the procedure with successful weaning of his to Levophed 4 mcg per kilo per  minute by the end of the case.  The patient should be on aspirin indefinitely  and on long-term dual antiplatelet therapy with clopidogrel, which should be  continued for an absolute minimum of 12 months.  Vasopressors will be weaned  and continued supportive care in the ICU, and hopefully ventilator wean in the  near future with further initiation of medical therapy for his cardiomyopathy  and myocardial infarction as hemodynamics allow.      ___________________  Curlene Dolphinavid H Beyza Bellino M.D.  Dictated By:.   [facility]  D:08/25/2013 17:30:53  T:08/25/2013 18:30:47  [id number]  Electronically Authenticated by:  Curlene DolphinAVID H Naomi Fitton, MD On 09/09/2013 04:15 PM EDT

## 2013-08-25 NOTE — Procedures (Signed)
[facility]  Cardiac Catheterization  NAME:  Andre Bell  CATH PHYSICIAN: Curlene Dolphin M.D.  REF PHYSICIAN:   SEX:   M  DATE: 08/25/2013  DOB: 01-25-1936  LOCATION: 4ICU  CATH #:   MR#    [id number]  ACCT#  [id number]    cc: Mike Craze MD    HISTORY:  The patient is a 78 year old visiting from out of town who had a witnessed  cardiac arrest.  He did receive bystander CPR.  Presenting rhythm by EMS was  VFib.   He was shocked in the field, admitted to the ICU without acute  ischemic EKG changes and preserved left ventricular function on  echocardiogram.  The patient underwent therapeutic hypothermia by protocol and  was subsequently extubated with intact neurologic function.  He was  reintubated shortly thereafter with respiratory distress, with evidence of an  acute myocardial infarction with lateral ST depression and significantly  elevated cardiac enzymes acutely arising in the setting of his reintubation,  also with cardiogenic shock and requiring vasopressor support.  The patient's  course was complicated by hemoptysis, which on bronchoscopy was thought to be  due to minor airway trauma and hemoptysis resolved without recurrence.  In  that setting the patient was brought to the cath lab, intubated on Levophed  for blood pressure support.  Consent was obtained from the patient's wife.    PROCEDURES PERFORMED:  1.  Selective bilateral coronary angiography.  2.  Left heart catheterization and left ventriculography.  3.  Complex coronary intervention to the LAD.  4.  Complex coronary intervention to left circumflex.    FINDINGS:  1.  The left main coronary artery is a long medium caliber vessel.  There is  no angiographic evidence of discrete stenosis.  2.  The LAD is a small caliber vessel.  The LAD is diffusely diseased with  heavy calcium throughout the proximal and mid portions of the vessel.  There  is a focal 95% stenosis in the mid LAD at the takeoff of the diagonal branch.   There are 3 small diagonal branches.  3.  The left circumflex is a large caliber vessel.  There is ostial and  proximal disease throughout a moderately calcified and highly tortuous  proximal left circumflex with a tubular 95% stenosis in the mid left  circumflex.  The vessel beyond that is a large caliber vessel which derives a  medium-sized first obtuse marginal branch and a smaller lateral  limb branch  from that obtuse marginal.  4.  The RCA is a medium caliber dominant vessel.  There is diffuse disease in  the ostial, proximal and early mid RCA with sequential 70% stenoses.  There is  TIMI 3 flow throughout the RCA.  There is a medium sized posterior descending  artery and 2 small posterolateral branches.  5.  Successful complex intervention to the LAD with a 32 mm Promus Premiere  drug-eluting stent tapered from 3.0 to 2.6 mm.  6.  Successful complex intervention to the ostial and proximal left circumflex  with a 28 mm Promus Premiere drug-eluting stent dilated to 3 mm.    HEMODYNAMICS:  LVEDP is 26.  There is no gradient on pullback across the aortic valve.  For  further details of hemodynamics, please see separate cath lab documentation.    LEFT VENTRICULOGRAPHY  (RAO 30 DEGREES):  Estimated LVEF is 55%.  There is moderate mid to apical anterior hypokinesis  and the inferior wall is hyperdynamic.  There  is no appreciable mitral  regurgitation.    PROCEDURAL NARRATIVE:  The patient was prepped and draped in sterile fashion.  The right wrist was  anesthetized with 1% lidocaine.  Right radial artery was accessed with a  6-French hydrophilic sidearm sheath.  Selective left coronary angiography was  performed with a 6-French Tiger catheter.  Selective right coronary  angiography was performed with a 6-French Tiger catheter.  A 6-French Tiger  catheter was used to cross the aortic valve, measure left ventricular  pressures, and perform pullback across the aortic valve.  The 6-French Tiger   catheter was also used to perform left ventriculography with reduced contrast  load due to the elevated LVP and to minimize contrast exposure, but injection  of 10 mL was adequate to visualize the left ventricle and assess left  ventricular function.    INTERVENTION:  IV bivalirudin was administered for anticoagulation.  The left coronary artery  was engaged with a 6-French XB 3.0 guide catheter.  Cougar XT guidewire was  advanced into the obtuse marginal branch and a run-through wire was advanced  as a buddy wire.  The left circumflex was dilated with serial inflations of a  2.5 x 12 mm Apex balloon to 8 to 10 atmospheres.  A 3.0  x 28 mm Promus  Premier drug-eluting stent was then advanced, but would not cross the lesion.  The circumflex was then dilated with a 3  x 12 mm NC Quantum Apex serial  inflations to 12 to 14 atmospheres.  Again, there was much difficulty  advancing the stent.  Ultimately using GuideLiner support the stent was  successfully delivered and deployed at 15 atmospheres.  The stent was then  postdilated with serial inflations of a 3  x 12-mm NC Trek balloon to 14 to 16  atmospheres.  Post-intervention, there is no residual stenosis in the left  circumflex and TIMI 3 flow.  It should be noted that the stent was placed at  the ostium of left circumflex with care to precisely deliver this stent.  Before post-dilating the ostial left circumflex stent a Cougar XT guidewire  was advanced into the LAD to protect this vessel.  Post-intervention, there  was no apparent plaque shift or injury to the left main or ostial LAD.    Attention was then turned to the LAD.  A Cougar XT guidewire was advanced into  the distal LAD.  The LAD was dilated with a 2.5 x 12 mm Apex balloon.  Serial  inflations to 8 to 10 atmospheres.   The  vessel was then dilated with a 2.5 x  20 mm NC Quantum Apex with serial inflations to 14 to 16 atmospheres.  A 2.5 x   32 mm Promus Premier drug-eluting stent was difficult to advance.  A  GuideLiner was used for additional support and the stent was ultimately  delivered and successfully deployed at 15 atmospheres.  The proximal  two-thirds of the stent was then post-dilated with serial inflations of a 3  x  12 mm NC balloon at 14 to 16 atmospheres.  Post-intervention, there was no  residual stenosis in the LAD with TIMI 3 flow in the LAD.  There is TIMI 3  flow down 3 small jailed diagonal branches as well as jailed septal branches.    COMPLICATIONS:  None.    ESTIMATED BLOOD LOSS:  Minimal.    HEMOSTASIS:  Right radial artery TR band.    ASSISTANTS:  None except usual cath lab staff.  SPECIMENS OBTAINED:  None.    FLUOROSCOPY TIME AND CONTRAST USED:    Please see separate cath lab documentation.    ASSESSMENT AND PLAN:  This is a 78 year old male with cardiogenic shock and non-ST-elevation  myocardial infarction after presenting with witnessed cardiac arrest.  The  patient is now successfully status post complex 2-vessel intervention with  drug-eluting stents.  He was actually doing better hemodynamically throughout  the procedure with successful weaning of his to Levophed 4 mcg per kilo per  minute by the end of the case.  The patient should be on aspirin indefinitely  and on long-term dual antiplatelet therapy with clopidogrel, which should be  continued for an absolute minimum of 12 months.  Vasopressors will be weaned  and continued supportive care in the ICU, and hopefully ventilator wean in the  near future with further initiation of medical therapy for his cardiomyopathy  and myocardial infarction as hemodynamics allow.      ___________________  Curlene Dolphinavid H Maria Coin M.D.  Dictated By:.   [facility]  D:08/25/2013 17:30:53  T:08/25/2013 18:30:47  [id number]  Electronically Authenticated by:  Curlene DolphinAVID H Farryn Linares, MD On 09/09/2013 04:15 PM EDT

## 2013-08-29 ENCOUNTER — Telehealth: Payer: Self-pay | Admitting: Family Medicine

## 2013-08-29 NOTE — Telephone Encounter (Signed)
Dr. Dennard Schaumann aware and lmovm as to the mcg of his thyroid medication

## 2013-08-29 NOTE — Telephone Encounter (Signed)
[facility] calling to let us know that Taylor Keller is in the hospital and has had a heart attack, the hospital in [place] needs to know the dosage of his thyroid medication if possible please call the hospital at [phone] or Taylor Keller at [phone]

## 2013-09-08 ENCOUNTER — Emergency Department (HOSPITAL_COMMUNITY): Payer: Medicare HMO

## 2013-09-08 ENCOUNTER — Telehealth: Payer: Self-pay | Admitting: *Deleted

## 2013-09-08 ENCOUNTER — Encounter (HOSPITAL_COMMUNITY): Payer: Self-pay | Admitting: Emergency Medicine

## 2013-09-08 ENCOUNTER — Inpatient Hospital Stay (HOSPITAL_COMMUNITY)
Admission: EM | Admit: 2013-09-08 | Discharge: 2013-09-10 | DRG: 293 | Disposition: A | Discharge status: Home Health | Payer: Medicare HMO | Attending: Cardiology | Admitting: Cardiology

## 2013-09-08 ENCOUNTER — Inpatient Hospital Stay (HOSPITAL_COMMUNITY): Payer: Medicare HMO

## 2013-09-08 DIAGNOSIS — Z8674 Personal history of sudden cardiac arrest: Secondary | ICD-10-CM

## 2013-09-08 DIAGNOSIS — I219 Acute myocardial infarction, unspecified: Secondary | ICD-10-CM | POA: Diagnosis present

## 2013-09-08 DIAGNOSIS — Z9861 Coronary angioplasty status: Secondary | ICD-10-CM

## 2013-09-08 DIAGNOSIS — I509 Heart failure, unspecified: Secondary | ICD-10-CM | POA: Diagnosis present

## 2013-09-08 DIAGNOSIS — I25118 Atherosclerotic heart disease of native coronary artery with other forms of angina pectoris: Secondary | ICD-10-CM

## 2013-09-08 DIAGNOSIS — R9431 Abnormal electrocardiogram [ECG] [EKG]: Secondary | ICD-10-CM

## 2013-09-08 DIAGNOSIS — I6529 Occlusion and stenosis of unspecified carotid artery: Secondary | ICD-10-CM | POA: Diagnosis present

## 2013-09-08 DIAGNOSIS — E039 Hypothyroidism, unspecified: Secondary | ICD-10-CM | POA: Diagnosis present

## 2013-09-08 DIAGNOSIS — Z79899 Other long term (current) drug therapy: Secondary | ICD-10-CM

## 2013-09-08 DIAGNOSIS — I2510 Atherosclerotic heart disease of native coronary artery without angina pectoris: Secondary | ICD-10-CM | POA: Diagnosis present

## 2013-09-08 DIAGNOSIS — I4510 Unspecified right bundle-branch block: Secondary | ICD-10-CM | POA: Diagnosis present

## 2013-09-08 DIAGNOSIS — I10 Essential (primary) hypertension: Secondary | ICD-10-CM | POA: Diagnosis present

## 2013-09-08 DIAGNOSIS — J90 Pleural effusion, not elsewhere classified: Secondary | ICD-10-CM

## 2013-09-08 DIAGNOSIS — Z7982 Long term (current) use of aspirin: Secondary | ICD-10-CM | POA: Diagnosis not present

## 2013-09-08 DIAGNOSIS — I5021 Acute systolic (congestive) heart failure: Principal | ICD-10-CM | POA: Diagnosis present

## 2013-09-08 DIAGNOSIS — Z955 Presence of coronary angioplasty implant and graft: Secondary | ICD-10-CM

## 2013-09-08 DIAGNOSIS — Z7902 Long term (current) use of antithrombotics/antiplatelets: Secondary | ICD-10-CM | POA: Diagnosis not present

## 2013-09-08 DIAGNOSIS — R609 Edema, unspecified: Secondary | ICD-10-CM | POA: Diagnosis not present

## 2013-09-08 DIAGNOSIS — R079 Chest pain, unspecified: Secondary | ICD-10-CM | POA: Diagnosis present

## 2013-09-08 DIAGNOSIS — E785 Hyperlipidemia, unspecified: Secondary | ICD-10-CM | POA: Diagnosis present

## 2013-09-08 DIAGNOSIS — I209 Angina pectoris, unspecified: Secondary | ICD-10-CM

## 2013-09-08 DIAGNOSIS — Z8673 Personal history of transient ischemic attack (TIA), and cerebral infarction without residual deficits: Secondary | ICD-10-CM

## 2013-09-08 DIAGNOSIS — R601 Generalized edema: Secondary | ICD-10-CM

## 2013-09-08 DIAGNOSIS — R0789 Other chest pain: Secondary | ICD-10-CM | POA: Diagnosis present

## 2013-09-08 DIAGNOSIS — R1313 Dysphagia, pharyngeal phase: Secondary | ICD-10-CM | POA: Diagnosis present

## 2013-09-08 HISTORY — DX: Cardiac murmur, unspecified: R01.1

## 2013-09-08 HISTORY — DX: Acute myocardial infarction, unspecified: I21.9

## 2013-09-08 LAB — CBC
HCT: 30.4 % — ABNORMAL LOW (ref 39.0–52.0)
HCT: 29.4 % — ABNORMAL LOW (ref 39.0–52.0)
HEMOGLOBIN: 9.5 g/dL — AB (ref 13.0–17.0)
Hemoglobin: 9.2 g/dL — ABNORMAL LOW (ref 13.0–17.0)
MCH: 27.1 pg (ref 26.0–34.0)
MCH: 27.2 pg (ref 26.0–34.0)
MCHC: 31.3 g/dL (ref 30.0–36.0)
MCHC: 31.3 g/dL (ref 30.0–36.0)
MCV: 86.6 fL (ref 78.0–100.0)
MCV: 87 fL (ref 78.0–100.0)
PLATELETS: 433 10*3/uL — AB (ref 150–400)
Platelets: 390 10*3/uL (ref 150–400)
RBC: 3.51 MIL/uL — ABNORMAL LOW (ref 4.22–5.81)
RBC: 3.38 MIL/uL — ABNORMAL LOW (ref 4.22–5.81)
RDW: 19.1 % — ABNORMAL HIGH (ref 11.5–15.5)
RDW: 19.2 % — ABNORMAL HIGH (ref 11.5–15.5)
WBC: 8.3 10*3/uL (ref 4.0–10.5)
WBC: 8 10*3/uL (ref 4.0–10.5)

## 2013-09-08 LAB — URINALYSIS, ROUTINE W REFLEX MICROSCOPIC
Bilirubin Urine: NEGATIVE
Glucose, UA: NEGATIVE mg/dL
Hgb urine dipstick: NEGATIVE
Ketones, ur: NEGATIVE mg/dL
LEUKOCYTES UA: NEGATIVE
NITRITE: NEGATIVE
PH: 7 (ref 5.0–8.0)
PROTEIN: NEGATIVE mg/dL
Specific Gravity, Urine: 1.014 (ref 1.005–1.030)
Urobilinogen, UA: 1 mg/dL (ref 0.0–1.0)

## 2013-09-08 LAB — PROTIME-INR
INR: 1.28 (ref 0.00–1.49)
Prothrombin Time: 16 seconds — ABNORMAL HIGH (ref 11.6–15.2)

## 2013-09-08 LAB — BASIC METABOLIC PANEL
Anion gap: 12 (ref 5–15)
BUN: 10 mg/dL (ref 6–23)
CALCIUM: 8.3 mg/dL — AB (ref 8.4–10.5)
CO2: 23 meq/L (ref 19–32)
CREATININE: 1.11 mg/dL (ref 0.50–1.35)
Chloride: 104 mEq/L (ref 96–112)
GFR calc Af Amer: 73 mL/min — ABNORMAL LOW (ref >90)
GFR calc non Af Amer: 63 mL/min — ABNORMAL LOW (ref >90)
GLUCOSE: 103 mg/dL — AB (ref 70–99)
Potassium: 3.7 mEq/L (ref 3.7–5.3)
Sodium: 139 mEq/L (ref 137–147)

## 2013-09-08 LAB — CREATININE, SERUM
Creatinine, Ser: 1.15 mg/dL (ref 0.50–1.35)
GFR calc Af Amer: 69 mL/min — ABNORMAL LOW (ref >90)
GFR calc non Af Amer: 60 mL/min — ABNORMAL LOW (ref >90)

## 2013-09-08 LAB — TROPONIN I
Troponin I: <0.3 ng/mL (ref <0.30)
Troponin I: <0.3 ng/mL (ref <0.30)
Troponin I: <0.3 ng/mL (ref <0.30)

## 2013-09-08 LAB — I-STAT TROPONIN, ED: Troponin i, poc: 0.07 ng/mL (ref 0.00–0.08)

## 2013-09-08 LAB — PRO B NATRIURETIC PEPTIDE: PRO B NATRI PEPTIDE: 15176 pg/mL — AB (ref 0–450)

## 2013-09-08 LAB — I-STAT CG4 LACTIC ACID, ED: Lactic Acid, Venous: 0.92 mmol/L (ref 0.5–2.2)

## 2013-09-08 MED ORDER — CETYLPYRIDINIUM CHLORIDE 0.05 % MT LIQD
7.0000 mL | Freq: Two times a day (BID) | OROMUCOSAL | Status: DC
  Administered 2013-09-08 – 2013-09-09 (×3): 7 mL via OROMUCOSAL

## 2013-09-08 MED ORDER — SODIUM CHLORIDE 0.9 % IJ SOLN
3.0000 mL | Freq: Two times a day (BID) | INTRAMUSCULAR | Status: DC
  Administered 2013-09-08 – 2013-09-10 (×5): 3 mL via INTRAVENOUS

## 2013-09-08 MED ORDER — ATORVASTATIN CALCIUM 80 MG PO TABS
80.0000 mg | ORAL_TABLET | Freq: Every evening | ORAL | Status: DC
  Administered 2013-09-08 – 2013-09-09 (×2): 80 mg via ORAL
  Filled 2013-09-08 (×4): qty 1

## 2013-09-08 MED ORDER — ZOLPIDEM TARTRATE 5 MG PO TABS
5.0000 mg | ORAL_TABLET | Freq: Every evening | ORAL | Status: DC | PRN
  Administered 2013-09-08 – 2013-09-09 (×2): 5 mg via ORAL
  Filled 2013-09-08 (×2): qty 1

## 2013-09-08 MED ORDER — GADOBENATE DIMEGLUMINE 529 MG/ML IV SOLN
27.0000 mL | Freq: Once | INTRAVENOUS | Status: AC
  Administered 2013-09-08: 27 mL via INTRAVENOUS

## 2013-09-08 MED ORDER — RESOURCE THICKENUP CLEAR PO POWD
ORAL | Status: DC | PRN
  Filled 2013-09-08: qty 125

## 2013-09-08 MED ORDER — ASPIRIN 81 MG PO CHEW
81.0000 mg | CHEWABLE_TABLET | Freq: Every day | ORAL | Status: DC
  Administered 2013-09-09 – 2013-09-10 (×2): 81 mg via ORAL
  Filled 2013-09-08 (×2): qty 1

## 2013-09-08 MED ORDER — ALBUTEROL SULFATE (2.5 MG/3ML) 0.083% IN NEBU: 2.5000 mg | INHALATION_SOLUTION | Freq: Four times a day (QID) | RESPIRATORY_TRACT | Status: DC | PRN

## 2013-09-08 MED ORDER — ALBUTEROL SULFATE (5 MG/ML) 0.5% IN NEBU: 2.5000 mg | INHALATION_SOLUTION | Freq: Four times a day (QID) | RESPIRATORY_TRACT | Status: DC | PRN

## 2013-09-08 MED ORDER — HEPARIN SODIUM (PORCINE) 5000 UNIT/ML IJ SOLN
5000.0000 [IU] | Freq: Three times a day (TID) | INTRAMUSCULAR | Status: DC
  Administered 2013-09-08 – 2013-09-10 (×6): 5000 [IU] via SUBCUTANEOUS
  Filled 2013-09-08 (×10): qty 1

## 2013-09-08 MED ORDER — ONDANSETRON HCL 4 MG/2ML IJ SOLN: 4.0000 mg | Freq: Four times a day (QID) | INTRAMUSCULAR | Status: DC | PRN

## 2013-09-08 MED ORDER — SODIUM CHLORIDE 0.9 % IV SOLN
250.0000 mL | INTRAVENOUS | Status: DC | PRN
Start: 2013-09-08 — End: 2013-09-10

## 2013-09-08 MED ORDER — FUROSEMIDE 10 MG/ML IJ SOLN
40.0000 mg | Freq: Once | INTRAMUSCULAR | Status: AC
  Administered 2013-09-08: 40 mg via INTRAVENOUS
  Filled 2013-09-08: qty 4

## 2013-09-08 MED ORDER — LEVOTHYROXINE SODIUM 175 MCG PO TABS
175.0000 ug | ORAL_TABLET | Freq: Every day | ORAL | Status: DC
  Administered 2013-09-08 – 2013-09-10 (×3): 175 ug via ORAL
  Filled 2013-09-08 (×5): qty 1

## 2013-09-08 MED ORDER — ISOSORBIDE MONONITRATE ER 60 MG PO TB24
60.0000 mg | ORAL_TABLET | Freq: Every day | ORAL | Status: DC
  Administered 2013-09-08 – 2013-09-10 (×3): 60 mg via ORAL
  Filled 2013-09-08 (×3): qty 1

## 2013-09-08 MED ORDER — LANSOPRAZOLE 30 MG PO TBDP: 30.0000 mg | ORAL_TABLET | Freq: Every day | ORAL | Status: DC

## 2013-09-08 MED ORDER — IPRATROPIUM BROMIDE 0.02 % IN SOLN: 0.5000 mg | Freq: Four times a day (QID) | RESPIRATORY_TRACT | Status: DC | PRN

## 2013-09-08 MED ORDER — FUROSEMIDE 10 MG/ML IJ SOLN
40.0000 mg | Freq: Every day | INTRAMUSCULAR | Status: DC
  Filled 2013-09-08: qty 4

## 2013-09-08 MED ORDER — PANTOPRAZOLE SODIUM 40 MG PO PACK
40.0000 mg | PACK | Freq: Every day | ORAL | Status: DC
  Administered 2013-09-08 – 2013-09-10 (×3): 40 mg via ORAL
  Filled 2013-09-08 (×3): qty 20

## 2013-09-08 MED ORDER — CLOPIDOGREL BISULFATE 75 MG PO TABS
75.0000 mg | ORAL_TABLET | Freq: Every day | ORAL | Status: DC
  Administered 2013-09-08 – 2013-09-10 (×3): 75 mg via ORAL
  Filled 2013-09-08 (×3): qty 1

## 2013-09-08 MED ORDER — METOPROLOL TARTRATE 50 MG PO TABS
50.0000 mg | ORAL_TABLET | Freq: Two times a day (BID) | ORAL | Status: DC
  Administered 2013-09-08 – 2013-09-10 (×5): 50 mg via ORAL
  Filled 2013-09-08 (×7): qty 1

## 2013-09-08 MED ORDER — ASPIRIN 81 MG PO CHEW
324.0000 mg | CHEWABLE_TABLET | Freq: Once | ORAL | Status: AC
  Administered 2013-09-08: 324 mg via ORAL
  Filled 2013-09-08: qty 4

## 2013-09-08 MED ORDER — ACETAMINOPHEN 325 MG PO TABS: 650.0000 mg | ORAL_TABLET | ORAL | Status: DC | PRN

## 2013-09-08 MED ORDER — SODIUM CHLORIDE 0.9 % IJ SOLN: 3.0000 mL | INTRAMUSCULAR | Status: DC | PRN

## 2013-09-08 MED ORDER — FUROSEMIDE 10 MG/ML IJ SOLN: 40.0000 mg | Freq: Two times a day (BID) | INTRAMUSCULAR | Status: DC

## 2013-09-08 NOTE — Progress Notes (Deleted)
Pt with order for Boost Breeze po on d/c.  Instructed by Tim case manager with [facility].  Quenton Fetter PA informed and spoke with Octavia Bruckner, RN who instructed that Gaspar Bidding to bring RX.  Pt made aware.  Karie Kirks, Therapist, sports.

## 2013-09-08 NOTE — Progress Notes (Signed)
Patient arrived to unit via stretcher from ED.  Patient oriented to unit and how to call nurse using telephone and call bell.  Placed on telemetry and CCMD notified.

## 2013-09-08 NOTE — ED Notes (Addendum)
Pt reports he just returned home from being admitted at a hospital in [place] on Friday.  Reports he had a "stroke and stents placed."  Reports chest pain upon waking every morning since having stent placement.  States this morning pain just felt different.  Denies chest pain at present.  States MD told him he had "a touch of pneumonia."   Denies sob, nausea, and vomiting.

## 2013-09-08 NOTE — ED Notes (Signed)
Pt was cardiac arrest at a residence while in [place] on 7/16.  CPR performed, pt taken to hospital, stents placed.  Discharged from [place] hospital 7/31 and is now home ready to begin rehab.  Wife reports he was up all night voiding every hr.  Pt c/o pain right side of chest that would last 5-10 minutes and at times shoot across his chest.  Denies N/V, diaphoresis, dyspnea, etc.  Bilat lower ext edema noted, reports new since arrest situation.  Pt wife reports pt has been laying in his recliner all weekend with feet up.  Also has concern that he does not have a cardiologist in [place] and needs help with cardiac rehab.

## 2013-09-08 NOTE — ED Notes (Signed)
Wife reports pt has had problems remembering why he was at hospital in [place].  Wife reports pt had MI with stent placement.  Reports pt had R sided chest pain this morning and has had intermittent episodes of sharp chest pain.

## 2013-09-08 NOTE — Telephone Encounter (Signed)
Received call from [place], [facility] with [facility]   Reports that patient was in [place] over weekend and started having chest pain.   States that patient went to [facility] over weekend, but left to come home. Will e-mail records for MD to review.   Patient currently in ED for chest pain at [facility].

## 2013-09-08 NOTE — H&P (Signed)
Patient ID: Taylor Keller MRN: [id number], DOB/AGE: [id number]   Admit date: 09/08/2013   Primary Physician: Odette Fraction, MD Primary Cardiologist: No cardiologist in [place]  Pt. Profile:  78 yo male with recent MI and cardiac arrest who was discharged from [facility] general hospital on 7/31 came in with SOB and LE edema  Problem List  Past Medical History  Diagnosis Date  . Lumbar stenosis     L5  . Hypothyroidism   . Hyperlipidemia   . Hypertension   . Stroke   . ED (erectile dysfunction)   . Internal carotid artery stenosis 5/15    bilateral, 40-59%  . Myocardial infarction     Past Surgical History  Procedure Laterality Date  . Adenotonsillectomy    . Coronary stent placement       Allergies  Allergies  Allergen Reactions  . Benazepril Cough    HPI  The patient is a 78 year old gentleman with past medical history of hypertension, hyperlipidemia, hypothyroidism, history of stroke 5 years ago and history of recent coronary artery disease/MI. According to the patient, he has no known heart problem prior to this year. His last echocardiogram on 06/05/2013 showed LV EF 50-60%, no regional wall motion abnormality, normal diastolic function and moderately to severely dilated left atrium. Patient recently went to [place] where he had a V. fib cardiac arrest on 08/21/2013. ROSC was achieved after multiple CPR. He spent over a week in the ICU. He underwent cardiac catheterization on 08/25/2013 and received drug-eluting stent to LAD and another drug-eluting stent to left circumflex artery. Per pt, he had 50% residual in the back of the heart, however no record is available. LV gram and echocardiogram were reportedly obtained in [facility] however records not available at this time. Patient was eventually discharged on 09/05/2013, after which he came back to [place]. He has not started on cardiac rehabilitation as this time. According to the  patient's wife, they decided to leave the [facility] early as he did not wish to stay longer. He has remained on honey thick liquid diet as speech therapist in [facility] noted he has some silent aspiration on barium swallow study. He had an outpatient speech therapy eval scheduled. Even prior to discharge, patient has some shortness of breath at baseline. However after his discharge, he has increased shortness of breath especially at night. He also had significant coughing whenever he tried to lay down. He also noted to have developed a worsening lower extremity edema. He was not prescribed any fluid pill on discharge. According to the patient he has been compliant with his medication at home including aspirin, atorvastatin, Plavix, Imdur, and metoprolol. According to the wife, patient has not had any paroxysmal nocturnal dyspnea, however he does sleep on elevated angle which appears to help with cough. Last night, he began to experience right-sided dull chest pain occurring 2-3 minutes at a time which has been associated with his shortness of breath. He felt the chest discomfort is worse with laying down, and relieved when sitting up. He denies any chest discomfort with exertion.  Patient finally decided to seek medical attention at [facility] in the morning of 08/08/2013. On arrival to the hospital, he was noted to have blood pressure of 151/58. O2 saturation 95% on room air. Initial laboratory finding was significant for creatinine of 1.11, proBNP of 15,000, troponin of 0.07, hemoglobin of 5.5, platelet of 433. According to the wife, patient was also noted to have  a low-grade temperature in the 90s range at home. He is currently afebrile in the hospital. Blood culture has been obtained. Chest x-ray showed small bilateral pleural effusions with basilar atelectasis. EKG showed normal sinus rhythm with heart rate of 70s, nonspecific ST-T wave changes, incomplete right bundle branch block.  Cardiology was consulted for CHF   Home Medications  Prior to Admission medications   Medication Sig Start Date End Date Taking? Authorizing Provider  albuterol (PROVENTIL) (5 MG/ML) 0.5% nebulizer solution Take 2.5 mg by nebulization every 6 (six) hours as needed for wheezing or shortness of breath.   Yes Historical Provider, MD  aspirin 81 MG chewable tablet Chew 81 mg by mouth daily.   Yes Historical Provider, MD  atorvastatin (LIPITOR) 80 MG tablet Take 80 mg by mouth every evening.   Yes Historical Provider, MD  clopidogrel (PLAVIX) 75 MG tablet Take 75 mg by mouth daily.   Yes Historical Provider, MD  ipratropium (ATROVENT) 0.02 % nebulizer solution Take 0.5 mg by nebulization every 6 (six) hours as needed for wheezing or shortness of breath.   Yes Historical Provider, MD  isosorbide mononitrate (IMDUR) 60 MG 24 hr tablet Take 60 mg by mouth daily.   Yes Historical Provider, MD  lansoprazole (PREVACID SOLUTAB) 30 MG disintegrating tablet Take 30 mg by mouth daily.   Yes Historical Provider, MD  levothyroxine (SYNTHROID, LEVOTHROID) 175 MCG tablet Take 175 mcg by mouth daily.   Yes Historical Provider, MD  metoprolol (LOPRESSOR) 50 MG tablet Take 50 mg by mouth 2 (two) times daily.   Yes Historical Provider, MD    Family History  Family History  Problem Relation Age of Onset  . Cancer Sister     colon    Social History  History   Social History  . Marital Status: Married    Spouse Name: N/A    Number of Children: N/A  . Years of Education: N/A   Occupational History  . Not on file.   Social History Main Topics  . Smoking status: Never Smoker   . Smokeless tobacco: Never Used  . Alcohol Use: Yes     Comment: Occasional wine  . Drug Use: No  . Sexual Activity: Not on file   Other Topics Concern  . Not on file   Social History Narrative  . No narrative on file     Review of Systems General:  No chills, night sweats or weight changes.  Low grade temp at  home Cardiovascular:  No dyspnea on exertion. R sided intermittent CP, orthopnea Dermatological: No rash, lesions/masses Respiratory: No dyspnea +cough worse when lying down Urologic: No hematuria, dysuria Abdominal:   No nausea, vomiting, diarrhea, bright red blood per rectum, melena, or hematemesis Neurologic:  No visual changes, wkns, changes in mental status. All other systems reviewed and are otherwise negative except as noted above.  Physical Exam  Blood pressure 133/52, pulse 69, temperature 97.9 F (36.6 C), temperature source Oral, resp. rate 18, SpO2 96.00%.  General: Pleasant, NAD Psych: Normal affect. Neuro: Alert and oriented X 3. Moves all extremities spontaneously. HEENT: Normal  Neck: murmur radiating to bilateral carotid. +JVD Lungs:  Resp regular and unlabored. markedly diminished airmovement in bilateral bases, intermittent rale Heart: RRR no s3, s4, or murmurs. Abdomen: Soft, non-tender, non-distended, BS + x 4.  Extremities: No clubbing, cyanosis DP/PT/Radials 2+ and equal bilaterally. 2+ pitting edema in the bilateral LE  Labs  Troponin [facility] of Care Test)  Recent Labs  09/08/13 [phone]  TROPIPOC 0.07   No results found for this basename: CKTOTAL, CKMB, TROPONINI,  in the last 72 hours Lab Results  Component Value Date   WBC 8.3 09/08/2013   HGB 9.5* 09/08/2013   HCT 30.4* 09/08/2013   MCV 86.6 09/08/2013   PLT 433* 09/08/2013    Recent Labs Lab 09/08/13 0525  NA 139  K 3.7  CL 104  CO2 23  BUN 10  CREATININE 1.11  CALCIUM 8.3*  GLUCOSE 103*   Lab Results  Component Value Date   CHOL 99 05/13/2013   HDL 38* 05/13/2013   LDLCALC 51 05/13/2013   TRIG 48 05/13/2013   No results found for this basename: DDIMER     Radiology/Studies  Dg Chest 2 View  09/08/2013   CLINICAL DATA:  Right-sided chest pain  EXAM: CHEST  2 VIEW  COMPARISON:  03/14/2012  FINDINGS: Borderline cardiomegaly, with the lower heart obscured by the pulmonary process. Coronary stents  noted. Negative upper mediastinal contours.  There are small bilateral pleural effusions with basilar lung opacity. The symmetry and history favors atelectasis. No evidence of pneumothorax. Pulmonary venous congestion, best appreciated in the lateral projection.  IMPRESSION: Small bilateral pleural effusion with basilar atelectasis.   Electronically Signed   By: Jorje Guild M.D.   On: 09/08/2013 06:50    ECG  NSR with 70s, nonspecific ST-T wave changes  ASSESSMENT AND PLAN  1. Acute systolic CHF   - admit for IV lasix diuresis  - check MRI to assess EF, wall motion, and sign of pericarditis  2. Atypical chest pain  - R sided, dull pain, occuring 2-3 min at a time, worse with laying down, better when sitting up. Not related to exertion or deep inspiration  - low suspicion for ACS  - trend enzyme, obtain record from [facility] for cath report, echo and imaging  - on honey thick liquid diet at home due to concern of silent aspiration in [place], continue honey thick liquid after MRI, NPO for now, speech therapist consult  3. CAD with recent MI  - DES x 2 to LAD and LCx in [facility] on 7/20 in [place]  4. Hypertension 5. Hyperlipidemia 6. hypothyroidism 7. Stroke with no residual neurological symptom    Signed, Taylor Keller, Hershal Coria 09/08/2013, 8:28 AM   The patient was seen, examined and discussed with Taylor Deforest, PA-C and I agree with the above.   A 78 year old male with h/o HTN, CVA in 2010 who had a cardiac arrest on 08/21/2013, he was started on CPR by bystender immediately followed by EMS CPR within minutes. He was transferred to the [facility] in [place] and received PCI to LAD and LCX. Limited records obtained from [place]. Per patient he has residual 50% stenosis in the RCA. He had prolonged 10 day stay in ICU. He left earlier than recommended as he wanted to be at home. He presented today with positional right sided chest pain and  worsening SOB.   In the ER he was found to be significantly fluid overloaded, he responded greatly to the first dose of Lasix. Crea is normal. BNP 15.000. We will continue iv diuresis and monitor kidney function closely. We don't have any post arrest echo and report from ventriculogram.  Regarding his chest pain, I don't believe its ischemic. The first troponin is negative, ECG is unremarkable. He has systolic rub and positional pain consistent with pericarditis.   We will order a cardiac MRI that  will assess pericarditis as well as LV, RV EF, scar burden and evaluate for overall prognosis.   We will also order a swallow study as the patient is still on thick liquid diet secondary to aspiration.   Dorothy Spark 09/08/2013

## 2013-09-08 NOTE — ED Provider Notes (Signed)
CSN: [id number]     Arrival date & time 09/08/13  7829 History   First MD Initiated Contact with Patient 09/08/13 0541     Chief Complaint  Patient presents with  . Chest Pain     (Consider location/radiation/quality/duration/timing/severity/associated sxs/prior Treatment) Patient is a 78 y.o. male presenting with chest pain. The history is provided by the spouse and the patient. The history is limited by the condition of the patient.  Chest Pain Pain location:  R chest Pain quality: sharp   Pain radiates to:  Does not radiate Onset quality:  Gradual Timing:  Sporadic Progression:  Unchanged Chronicity:  New Context comment:  Ambulation Relieved by:  Nothing Associated symptoms: lower extremity edema and shortness of breath   Associated symptoms: no abdominal pain, no palpitations and no syncope   Risk factors: coronary artery disease and male sex   Patient had a cardiac arrest with CPR in a hospital in [place].  Received multiple stents no cardiac rehab.  Was discharged to home on 7/31.  No with sharp right sided CP when getting up to use bathroom frequently has memory gaps.  No defibrillator placed. Temps to 99 at home.    Past Medical History  Diagnosis Date  . Lumbar stenosis     L5  . Hypothyroidism   . Hyperlipidemia   . Hypertension   . Stroke   . ED (erectile dysfunction)   . Internal carotid artery stenosis 5/15    bilateral, 40-59%  . Myocardial infarction    Past Surgical History  Procedure Laterality Date  . Adenotonsillectomy    . Coronary stent placement     Family History  Problem Relation Age of Onset  . Cancer Sister     colon   History  Substance Use Topics  . Smoking status: Never Smoker   . Smokeless tobacco: Never Used  . Alcohol Use: Yes     Comment: Occasional wine    Review of Systems  Respiratory: Positive for shortness of breath.   Cardiovascular: Positive for chest pain and leg swelling. Negative for palpitations and syncope.   Gastrointestinal: Negative for abdominal pain.  Genitourinary: Positive for frequency.  All other systems reviewed and are negative.     Allergies  Benazepril  Home Medications   Prior to Admission medications   Medication Sig Start Date End Date Taking? Authorizing Provider  albuterol (PROVENTIL) (5 MG/ML) 0.5% nebulizer solution Take 2.5 mg by nebulization every 6 (six) hours as needed for wheezing or shortness of breath.   Yes Historical Provider, MD  aspirin 81 MG chewable tablet Chew 81 mg by mouth daily.   Yes Historical Provider, MD  atorvastatin (LIPITOR) 80 MG tablet Take 80 mg by mouth every evening.   Yes Historical Provider, MD  clopidogrel (PLAVIX) 75 MG tablet Take 75 mg by mouth daily.   Yes Historical Provider, MD  ipratropium (ATROVENT) 0.02 % nebulizer solution Take 0.5 mg by nebulization every 6 (six) hours as needed for wheezing or shortness of breath.   Yes Historical Provider, MD  isosorbide mononitrate (IMDUR) 60 MG 24 hr tablet Take 60 mg by mouth daily.   Yes Historical Provider, MD  lansoprazole (PREVACID SOLUTAB) 30 MG disintegrating tablet Take 30 mg by mouth daily.   Yes Historical Provider, MD  levothyroxine (SYNTHROID, LEVOTHROID) 175 MCG tablet Take 175 mcg by mouth daily.   Yes Historical Provider, MD  metoprolol (LOPRESSOR) 50 MG tablet Take 50 mg by mouth 2 (two) times daily.   Yes  Historical Provider, MD   BP 151/58  Temp(Src) 98.2 F (36.8 C) (Oral)  Resp 20  SpO2 95% Physical Exam  Constitutional: He appears well-developed and well-nourished.  HENT:  Head: Normocephalic and atraumatic.  Mouth/Throat: Oropharynx is clear and moist.  Eyes: Conjunctivae and EOM are normal.  Lens implants in both eyes  Neck: Normal range of motion. Neck supple. No tracheal deviation present.  Cardiovascular: Normal rate, regular rhythm and intact distal pulses.   Pulmonary/Chest: No stridor. He has decreased breath sounds. He has no rhonchi. He has no rales.   Abdominal: Soft. Bowel sounds are normal. There is no tenderness. There is no rebound and no guarding.  Musculoskeletal: Normal range of motion. He exhibits edema.  Neurological: He is alert. He has normal reflexes.  Skin: Skin is warm and dry. He is not diaphoretic. There is pallor.  Psychiatric: He has a normal mood and affect.    ED Course  Procedures (including critical care time) Labs Review Labs Reviewed  CBC - Abnormal; Notable for the following:    RBC 3.51 (*)    Hemoglobin 9.5 (*)    HCT 30.4 (*)    RDW 19.1 (*)    Platelets 433 (*)    All other components within normal limits  BASIC METABOLIC PANEL - Abnormal; Notable for the following:    Glucose, Bld 103 (*)    Calcium 8.3 (*)    GFR calc non Af Amer 63 (*)    GFR calc Af Amer 73 (*)    All other components within normal limits  PROTIME-INR - Abnormal; Notable for the following:    Prothrombin Time 16.0 (*)    All other components within normal limits  CULTURE, BLOOD (ROUTINE X 2)  CULTURE, BLOOD (ROUTINE X 2)  URINALYSIS, ROUTINE W REFLEX MICROSCOPIC  PRO B NATRIURETIC PEPTIDE  I-STAT TROPOININ, ED  I-STAT TROPOININ, ED  I-STAT CG4 LACTIC ACID, ED    Imaging Review No results found.   EKG Interpretation   Date/Time:  Monday September 08 2013 05:17:19 EDT Ventricular Rate:  71 PR Interval:  144 QRS Duration: 104 QT Interval:  410 QTC Calculation: 445 R Axis:   79 Text Interpretation:  Normal sinus rhythm Incomplete right bundle branch  block Nonspecific ST abnormality Confirmed by [facility]  MD, Liliya Fullenwider  ([id number]) on 09/08/2013 6:20:45 AM      MDM   Final diagnoses:  None   MDM Reviewed: nursing note and vitals (cath procedure note from OSH) Interpretation: labs, ECG and x-ray Consults: cardiology   Admit   Case d/w Drs. Augustin Coupe of cardiology    Chayse Zatarain K Aneli Zara-Rasch, MD 09/08/13 [phone]

## 2013-09-08 NOTE — Progress Notes (Signed)
Called MRI and spoke with Charla  who informed nurse that she will  call for him probably in 11minutes for cardiac MRI.  Pt's  spouse notified.  Will continue to monitor.  Karie Kirks, Therapist, sports.

## 2013-09-08 NOTE — ED Notes (Signed)
MD at bedside. 

## 2013-09-08 NOTE — Procedures (Signed)
Objective Swallowing Evaluation: Modified Barium Swallowing Study  Patient Details  Name: Taylor Keller MRN: [id number] Date of Birth: 01-Oct-1935  Today's Date: 09/08/2013 Time: 1400-1440 SLP Time Calculation (min): 40 min  Past Medical History:  Past Medical History  Diagnosis Date  . Lumbar stenosis     L5  . Hypothyroidism   . Hyperlipidemia   . Hypertension   . ED (erectile dysfunction)   . Internal carotid artery stenosis 5/15    bilateral, 40-59%  . Myocardial infarction 08/2013  . Heart murmur   . Stroke ~ 2011    "slight; no permanent damage"   Past Surgical History:  Past Surgical History  Procedure Laterality Date  . Tonsillectomy and adenoidectomy    . Appendectomy    . Coronary angioplasty with stent placement  08/2013    "2"  . Cataract extraction, bilateral Bilateral ~ 2012   HPI:  78 yo male with recent MI and cardiac arrest who was discharged from [facility] general hospital on 7/31 came in with SOB and LE edema.  PMH: Dysphagia with silent aspiration s/p extubation (intubated x7 days).  Pt was d/c'd home on Honey thick liquids, and has complied with this restriction at home, per pt and his wife.  H/O GERD     Assessment / Plan / Recommendation Clinical Impression  Dysphagia Diagnosis: Mild pharyngeal phase dysphagia Clinical impression: Patient currently exhibits only a mild sensori-motor pharyngeal dysphagia, characterized by decreased base of tongue contraction to the pharyngeal wall and decreased laryngeal elevation and epiglottic deflection.  These deficits result in trace penetration of thin liquids when using a straw, and moderate vallecular residue with all solid consistencies.  Effective compensatory strategies:  Chin tuck with every swallow, and swallow twice to clear residue.    Treatment Recommendation  Therapy as outlined in treatment plan below    Diet Recommendation Dysphagia 3 (Mechanical Soft);Thin liquid   Liquid Administration via: No  straw;Cup Medication Administration: Whole meds with puree Supervision: Patient able to self feed;Full supervision/cueing for compensatory strategies Compensations: Slow rate;Small sips/bites Postural Changes and/or Swallow Maneuvers: Seated upright 90 degrees;Chin tuck    Other  Recommendations Oral Care Recommendations: Oral care BID;Patient independent with oral care Other Recommendations: Clarify dietary restrictions   Follow Up Recommendations  None    Frequency and Duration min 2x/week  2 weeks   Pertinent Vitals/Pain n/a         General HPI: 78 yo male with recent MI and cardiac arrest who was discharged from [facility] general hospital on 7/31 came in with SOB and LE edema.  PMH: Dysphagia with silent aspiration s/p extubation (intubated x7 days).  Pt was d/c'd home on Honey thick liquids, and has complied with this restriction at home, per pt and his wife.  H/O GERD Type of Study: Modified Barium Swallowing Study Reason for Referral: Objectively evaluate swallowing function Previous Swallow Assessment: Previous MBS at [facility].  Positive silent aspiration of thin and nectar thick liquids. Diet Prior to this Study: Dysphagia 2 (chopped);Honey-thick liquids Temperature Spikes Noted: Yes (low-grade fever) Respiratory Status: Room air History of Recent Intubation: Yes Length of Intubations (days): 7 days Behavior/Cognition: Alert;Cooperative;Pleasant mood Oral Cavity - Dentition: Adequate natural dentition Oral Motor / Sensory Function: Within functional limits Self-Feeding Abilities: Able to feed self Patient Positioning: Upright in chair Baseline Vocal Quality: Hoarse;Low vocal intensity (Frequent throat clearing) Volitional Cough: Weak Volitional Swallow: Able to elicit Anatomy: Within functional limits Pharyngeal Secretions: Not observed secondary MBS    Reason for  Referral Objectively evaluate swallowing function   Oral Phase Oral Preparation/Oral  Phase Oral Phase: WFL   Pharyngeal Phase Pharyngeal Phase Pharyngeal Phase: Impaired Pharyngeal - Thin Pharyngeal - Thin Straw: Penetration/Aspiration during swallow Penetration/Aspiration details (thin straw): Material enters airway, remains ABOVE vocal cords then ejected out Pharyngeal - Solids Pharyngeal - Puree: Reduced tongue base retraction;Pharyngeal residue - valleculae;Compensatory strategies attempted (Comment) (Chin tuck and double swallows ) Pharyngeal - Regular: Reduced tongue base retraction;Pharyngeal residue - valleculae;Compensatory strategies attempted (Comment) (Chin tuck and double swallows) Pharyngeal - Pill: Reduced tongue base retraction;Pharyngeal residue - valleculae (chin tuck and repeat swallow with puree)  Cervical Esophageal Phase    GO    Cervical Esophageal Phase Cervical Esophageal Phase: Impaired Cervical Esophageal Phase - Thin Thin Cup: Esophageal backflow into cervical esophagus (osteophyte noted, with backflow of liquids )         Quinn Axe T 09/08/2013, 2:51 PM

## 2013-09-09 DIAGNOSIS — I369 Nonrheumatic tricuspid valve disorder, unspecified: Secondary | ICD-10-CM

## 2013-09-09 LAB — BASIC METABOLIC PANEL
Anion gap: 11 (ref 5–15)
BUN: 10 mg/dL (ref 6–23)
CO2: 25 mEq/L (ref 19–32)
Calcium: 8.2 mg/dL — ABNORMAL LOW (ref 8.4–10.5)
Chloride: 104 mEq/L (ref 96–112)
Creatinine, Ser: 1.19 mg/dL (ref 0.50–1.35)
GFR calc Af Amer: 67 mL/min — ABNORMAL LOW (ref >90)
GFR calc non Af Amer: 58 mL/min — ABNORMAL LOW (ref >90)
Glucose, Bld: 94 mg/dL (ref 70–99)
Potassium: 3.9 mEq/L (ref 3.7–5.3)
Sodium: 140 mEq/L (ref 137–147)

## 2013-09-09 MED ORDER — FUROSEMIDE 10 MG/ML IJ SOLN
40.0000 mg | Freq: Two times a day (BID) | INTRAMUSCULAR | Status: DC
  Administered 2013-09-09 – 2013-09-10 (×3): 40 mg via INTRAVENOUS
  Filled 2013-09-09 (×3): qty 4

## 2013-09-09 MED ORDER — LOSARTAN POTASSIUM 25 MG PO TABS
25.0000 mg | ORAL_TABLET | Freq: Every day | ORAL | Status: DC
  Administered 2013-09-09 – 2013-09-10 (×2): 25 mg via ORAL
  Filled 2013-09-09 (×2): qty 1

## 2013-09-09 NOTE — Progress Notes (Signed)
CARDIAC REHAB PHASE I   PRE:  Rate/Rhythm: 76 SR  BP:  Supine:   Sitting: 130/70  Standing:    SaO2: 95%RA  MODE:  Ambulation: 460 ft   POST:  Rate/Rhythm: 89 SR  BP:  Supine:   Sitting: 148/70  Standing:    SaO2: 97%RA 1115-1230 Pt walked 460 ft on RA with rolling walker and asst x 1. Has walker at home. Encouraged wife to walk with pt at home until stronger. Pt has only been home a couple of days after cardiac arrest. Pt and wife stated they did not get MI ed at [facility] so I gave MI ed, stent/plavix, ex ed , CHF signs and symptoms and reviewed when to call MD with weight gain. Gave low sodium handouts and discussed 2000 mg restriction and fluid restriction. Discussed CRP 2 and permission obtained to refer to [place] Phase 2. Pt would like to see case manager to discuss home needs. May benefit from home health PT. Pt and wife very appreciative of information given.   Graylon Good, RN BSN  09/09/2013 12:25 PM

## 2013-09-09 NOTE — Progress Notes (Signed)
Pt requesting to know result from cardia MRI done yesterday & when echo will be done. Oswaldo Done notified of pt's request and instructed by PA that test is negative.  Pt and spouse informed and also echo tech up at this time to do test as ordered.  Karie Kirks, Therapist, sports.

## 2013-09-09 NOTE — Progress Notes (Signed)
    Subjective:  Chest pain (his primary complaint on admission) is improved. He denies dyspnea  Objective:  Vital Signs in the last 24 hours: Temp:  [97.4 F (36.3 C)-99.3 F (37.4 C)] 98.1 F (36.7 C) (08/04 0437) Pulse Rate:  [66-70] 70 (08/04 0437) Resp:  [18-21] 18 (08/04 0437) BP: (131-156)/(48-76) 131/48 mmHg (08/04 0437) SpO2:  [92 %-98 %] 94 % (08/04 0437) Weight:  [184 lb 4.9 oz (83.6 kg)-184 lb 6.4 oz (83.643 kg)] 184 lb 4.9 oz (83.6 kg) (08/04 0437)  Intake/Output from previous day:  Intake/Output Summary (Last 24 hours) at 09/09/13 0809 Last data filed at 09/09/13 0644  Gross per 24 hour  Intake    360 ml  Output   2850 ml  Net  -2490 ml    Physical Exam: General appearance: alert, cooperative and no distress Neck: no carotid bruit and no JVD Lungs: bilateral basilar crackles Heart: regular rate and rhythm   Rate: 68  Rhythm: normal sinus rhythm  Lab Results:  Recent Labs  09/08/13 0525 09/08/13 1003  WBC 8.3 8.0  HGB 9.5* 9.2*  PLT 433* 390    Recent Labs  09/08/13 0525 09/08/13 1003 09/09/13 0424  NA 139  --  140  K 3.7  --  3.9  CL 104  --  104  CO2 23  --  25  GLUCOSE 103*  --  94  BUN 10  --  10  CREATININE 1.11 1.15 1.19    Recent Labs  09/08/13 1634 09/08/13 2250  TROPONINI <0.30 <0.30    Recent Labs  09/08/13 0525  INR 1.28    Imaging: Imaging results have been reviewed  Cardiac Studies: MRI pending                                  Assessment/Plan:   Principal Problem:   Chest pain at rest Active Problems:   CAD- s/p LAD and CFX stent July 2015   Acute CHF   H/O cardiac arrest July 2015   Hypothyroidism   Hyperlipidemia   Hypertension   Internal carotid artery stenosis    PLAN: He has diuresed 2.6L since admission, he is only on 40 mg Lasix IV daily.  Check f/u BNP in am. MRI done, results pending.  ? check 2D echo if not already ordered. The pt has requested evaluation for HHPT and cardiac rehab and  this will be arranged. Add ARB (cough with ACE).  Kerin Ransom PA-C Beeper [phone] 09/09/2013, 8:09 AM  Seen and discussed with Kerin Ransom, PA-C. Agree with his assessment and plan as noted above. Patient's chest pain which was primarily on his right side has resolved. He still has significant fluid overload on exam with decreased breath sounds at the bases and with peripheral edema.  Initial chest x-ray showed bilateral pleural effusions.  We will continue IV diuresis and increase Lasix to 40 mg IV twice a day.  Recheck labs in the a.m. Continue dual antiplatelet therapy for his recent stents. We will obtain two-dimensional echocardiogram to update his LV function.  He is being started on ARB today. We will ask cardiac rehabilitation to see him.  After discharge he would like to be in the outpatient rehabilitation program. Anticipate possible discharge tomorrow if stable.  His MRI was done last night but results are not yet available.

## 2013-09-09 NOTE — Progress Notes (Signed)
  Echocardiogram 2D Echocardiogram has been performed.  Darlina Sicilian M 09/09/2013, 4:17 PM

## 2013-09-09 NOTE — Progress Notes (Signed)
UR complete.  Tawnya Pujol RN, MSN 

## 2013-09-09 NOTE — Progress Notes (Signed)
PT Cancellation Note  Patient Details Name: PURNELL DAIGLE MRN: [id number] DOB: [birth year]   Cancelled Treatment:    Reason Eval/Treat Not Completed: Patient at procedure or test/unavailable. Pt having echo done at this time. Will check back this afternoon, time permitting.    Jolyn Lent 09/09/2013, 3:52 PM  Jolyn Lent, PT, DPT Acute Rehabilitation Services Pager: [phone]

## 2013-09-09 NOTE — Progress Notes (Signed)
Speech Language Pathology Treatment: Dysphagia  Patient Details Name: LC JOYNT MRN: [id number] DOB: 09-01-1935 Today's Date: 09/09/2013 Time: 0037-0488 SLP Time Calculation (min): 13 min  Assessment / Plan / Recommendation Clinical Impression  Skilled treatment session focused on addressing dysphagia goals.  Upon SLP arrival patient reclined in bed drinking thin liquids from a cup with overt s/s of aspiration.  SLP initiated education regarding results of yesterday's objective assessment: sitting up right, chin tuck and two swallows per bite/sip.  Patient then Mod I for follow through for the reminder of the session with no other overt s/s of aspiraiton.  SLP recommends follow up 1x more to complete family education with wife.     HPI HPI: 78 yo male with recent MI and cardiac arrest who was discharged from [facility] general hospital on 7/31 came in with SOB and LE edema.  PMH: Dysphagia with silent aspiration s/p extubation (intubated x7 days).  Pt was d/c'd home on Honey thick liquids, and has complied with this restriction at home, per pt and his wife.  H/O GERD   Pertinent Vitals None  SLP Plan  Continue with current plan of care    Recommendations Diet recommendations: Dysphagia 3 (mechanical soft);Thin liquid Liquids provided via: Cup Medication Administration: Whole meds with puree Supervision: Patient able to self feed;Full supervision/cueing for compensatory strategies Compensations: Slow rate;Small sips/bites Postural Changes and/or Swallow Maneuvers: Seated upright 90 degrees;Chin tuck              Oral Care Recommendations: Oral care BID;Patient independent with oral care Follow up Recommendations: None Plan: Continue with current plan of care    GO     Carmelia Roller., CCC-SLP [phone]  [place] 09/09/2013, 4:47 PM

## 2013-09-10 LAB — BASIC METABOLIC PANEL
Anion gap: 15 (ref 5–15)
BUN: 11 mg/dL (ref 6–23)
CO2: 23 mEq/L (ref 19–32)
Calcium: 8.2 mg/dL — ABNORMAL LOW (ref 8.4–10.5)
Chloride: 101 mEq/L (ref 96–112)
Creatinine, Ser: 1.14 mg/dL (ref 0.50–1.35)
GFR calc Af Amer: 70 mL/min — ABNORMAL LOW (ref >90)
GFR calc non Af Amer: 61 mL/min — ABNORMAL LOW (ref >90)
Glucose, Bld: 95 mg/dL (ref 70–99)
Potassium: 3.7 mEq/L (ref 3.7–5.3)
Sodium: 139 mEq/L (ref 137–147)

## 2013-09-10 LAB — PRO B NATRIURETIC PEPTIDE: Pro B Natriuretic peptide (BNP): 9146 pg/mL — ABNORMAL HIGH (ref 0–450)

## 2013-09-10 MED ORDER — FUROSEMIDE 40 MG PO TABS: 40.0000 mg | ORAL_TABLET | Freq: Every day | ORAL | Status: DC

## 2013-09-10 MED ORDER — ACETAMINOPHEN 325 MG PO TABS: 650.0000 mg | ORAL_TABLET | ORAL | Status: AC | PRN

## 2013-09-10 MED ORDER — LOSARTAN POTASSIUM 25 MG PO TABS: 25.0000 mg | ORAL_TABLET | Freq: Every day | ORAL | Status: DC

## 2013-09-10 NOTE — Progress Notes (Signed)
Pt d/c'd to home with wife. D/c instructions and Rx given to and d/cw pt and wife. Follow up appt made for Sept 3. Office will call if earlier appt in available.

## 2013-09-10 NOTE — Evaluation (Signed)
Physical Therapy Evaluation Patient Details Name: Taylor Keller MRN: [id number] DOB: [birth year] Today's Date: 09/10/2013   History of Present Illness  Pt is a 78 y/o male with a recent MI and cardiac arrest, discharged from [facility] on 09/05/13. Pt presents to [facility] with CHF exacerbation.   Clinical Impression  Pt admitted with the above. Pt currently with functional limitations due to the deficits listed below (see PT Problem List). At the time of PT eval pt was able to perform gait training and transfers with supervision for safety. Pt fearful of falling due to decreased balance and asks to use the RW. Pt will benefit from skilled PT to increase their independence and safety with mobility to allow discharge to the venue listed below.      Follow Up Recommendations Home health PT;Supervision for mobility/OOB    Equipment Recommendations  None recommended by PT    Recommendations for Other Services       Precautions / Restrictions Precautions Precautions: Fall Restrictions Weight Bearing Restrictions: No      Mobility  Bed Mobility Overal bed mobility: Needs Assistance Bed Mobility: Supine to Sit     Supine to sit: Supervision     General bed mobility comments: Supervision for safety. Minimal use of bed rails for support.   Transfers Overall transfer level: Needs assistance Equipment used: Rolling walker (2 wheeled) Transfers: Sit to/from Stand Sit to Stand: Supervision         General transfer comment: Supervision for safety. Pt was able to power-up to full standing without UE support but somewhat using momentum to assist.   Ambulation/Gait Ambulation/Gait assistance: Min guard Ambulation Distance (Feet): 300 Feet Assistive device: Rolling walker (2 wheeled) Gait Pattern/deviations: Step-through pattern;Decreased stride length;Trunk flexed Gait velocity: Decreased Gait velocity interpretation: Below normal speed for age/gender General Gait  Details: VC's for improved posture and to look up rather than at the floor.  Stairs            Wheelchair Mobility    Modified Rankin (Stroke Patients Only)       Balance Overall balance assessment: Needs assistance Sitting-balance support: Feet supported;No upper extremity supported Sitting balance-Leahy Scale: Good     Standing balance support: Bilateral upper extremity supported;During functional activity Standing balance-Leahy Scale: Fair                   Standardized Balance Assessment Standardized Balance Assessment : Berg Balance Test Berg Balance Test Sit to Stand: Able to stand without using hands and stabilize independently Standing Unsupported: Able to stand safely 2 minutes Sitting with Back Unsupported but Feet Supported on Floor or Stool: Able to sit safely and securely 2 minutes Stand to Sit: Sits safely with minimal use of hands Transfers: Able to transfer safely, minor use of hands Standing Unsupported with Eyes Closed: Able to stand 10 seconds safely Standing Ubsupported with Feet Together: Able to place feet together independently and stand 1 minute safely From Standing, Reach Forward with Outstretched Arm: Can reach forward >12 cm safely (5") From Standing Position, Pick up Object from Floor: Able to pick up shoe, needs supervision From Standing Position, Turn to Look Behind Over each Shoulder: Looks behind one side only/other side shows less weight shift Turn 360 Degrees: Able to turn 360 degrees safely but slowly Standing Unsupported, Alternately Place Feet on Step/Stool: Able to complete >2 steps/needs minimal assist Standing Unsupported, One Foot in Front: Able to plae foot ahead of the other independently and hold 30 seconds  Standing on One Leg: Able to lift leg independently and hold equal to or more than 3 seconds Total Score: 45         Pertinent Vitals/Pain Vitals stable throughout session.     Home Living Family/patient expects  to be discharged to:: Private residence Living Arrangements: Spouse/significant other Available Help at Discharge: Family;Available 24 hours/day Type of Home: House Home Access: Stairs to enter Entrance Stairs-Rails: Left Entrance Stairs-Number of Steps: 4 Home Layout: Multi-level;Able to live on main level with bedroom/bathroom Home Equipment: Gilford Rile - 2 wheels;Bedside commode      Prior Function Level of Independence: Independent         Comments: Still driving, community ambulator     Hand Dominance   Dominant Hand: Right    Extremity/Trunk Assessment   Upper Extremity Assessment: Defer to OT evaluation;Overall WFL for tasks assessed           Lower Extremity Assessment: Overall WFL for tasks assessed;RLE deficits/detail RLE Deficits / Details: RLE slightly weaker than LLE, however not enough to differ the MMT score. Wife states it was noted that his R side was slightly weaker after his MI/cardiac arrest.     Cervical / Trunk Assessment: Normal  Communication   Communication: No difficulties  Cognition Arousal/Alertness: Awake/alert Behavior During Therapy: WFL for tasks assessed/performed Overall Cognitive Status: Within Functional Limits for tasks assessed                      General Comments      Exercises        Assessment/Plan    PT Assessment Patient needs continued PT services  PT Diagnosis Difficulty walking;Generalized weakness   PT Problem List Decreased strength;Decreased range of motion;Decreased activity tolerance;Decreased balance;Decreased mobility;Decreased knowledge of use of DME;Decreased safety awareness;Cardiopulmonary status limiting activity;Decreased knowledge of precautions  PT Treatment Interventions DME instruction;Gait training;Stair training;Functional mobility training;Therapeutic activities;Therapeutic exercise;Neuromuscular re-education;Patient/family education   PT Goals (Current goals can be found in the Care  Plan section) Acute Rehab PT Goals Patient Stated Goal: To return home independently PT Goal Formulation: With patient/family Time For Goal Achievement: 09/17/13 Potential to Achieve Goals: Good    Frequency Min 3X/week   Barriers to discharge        Co-evaluation               End of Session Equipment Utilized During Treatment: Gait belt Activity Tolerance: Patient tolerated treatment well Patient left: in chair;with call bell/phone within reach;with family/visitor present Nurse Communication: Mobility status         Time: 1010-1053 PT Time Calculation (min): 43 min   Charges:   PT Evaluation $Initial PT Evaluation Tier I: 1 Procedure PT Treatments $Therapeutic Activity: 23-37 mins   PT G CodesJolyn Lent 09/10/2013, 1:54 PM  Jolyn Lent, PT, DPT Acute Rehabilitation Services Pager: [phone]

## 2013-09-10 NOTE — Discharge Summary (Signed)
Patient ID: Taylor Keller,  MRN: [id number], DOB/AGE: May 24, 1935 78 y.o.  Admit date: 09/08/2013 Discharge date: 09/10/2013  Primary Care Provider:  Primary Cardiologist: Dr Meda Coffee (new)  Discharge Diagnoses Principal Problem:   Chest pain at rest Active Problems:   CAD- s/p LAD and CFX stent July 2015   Acute CHF   H/O cardiac arrest July 2015   Hypothyroidism   Hyperlipidemia   Hypertension   Internal carotid artery stenosis    Procedures:  Cardiac MRI 09/08/13                         Transthoracic echo 09/09/13   Hospital Course:  The patient is a 78 year old gentleman with past medical history of hypertension, hyperlipidemia, hypothyroidism, prior stroke 5 years ago and history of recent coronary artery disease/MI. According to the patient, he had no known heart problem prior to this year. His last echocardiogram on 06/05/2013 showed LV EF 50-60%, no regional wall motion abnormality, normal diastolic function and moderately to severely dilated left atrium. Patient recently went to [place] where he had a V. fib cardiac arrest on 08/21/2013. ROSC was achieved after multiple attempts at CPR. He spent over a week in the ICU. He underwent cardiac catheterization on 08/25/2013 and received drug-eluting stent to LAD and another drug-eluting stent to left circumflex artery. Per pt, he had 50% residual in the back of the heart, however no record is available. LV gram and echocardiogram were reportedly obtained in [facility] however records not available at this time. Patient was eventually discharged on 09/05/2013, after which he came back to [place]. He has not started on cardiac rehabilitation as this time. According to the patient's wife, they decided to leave the [facility] early as he did not wish to stay longer. He has remained on honey thick liquid diet as speech therapist in [facility] noted he has some silent aspiration on barium swallow study.                Since his discharge, he has had increased shortness of breath especially at night. He also had significant coughing whenever he tried to lay down. He also noted to have developed  worsening lower extremity edema. He was not prescribed a diuretic at discharge. According to the patient he has been compliant with his medication at home including aspirin, atorvastatin, Plavix, Imdur, and metoprolol. According to the wife, patient has not had any paroxysmal nocturnal dyspnea, however he does sleep on elevated angle which appears to help with cough.  he began to experience right-sided dull chest pain occurring 2-3 minutes at a time which has been associated with his shortness of breath. He felt the chest discomfort is worse with laying down, and relieved when sitting up. He denies any chest discomfort with exertion.  Patient finally decided to seek medical attention at [facility] in the morning of 09/08/2013. On arrival to the hospital, he was noted to have blood pressure of 151/58. O2 saturation 95% on room air. Initial laboratory finding was significant for a proBNP of 15,000. According to the wife, patient was also noted to have a low-grade temperature at home. He is currently afebrile in the hospital. Blood culture were obtained. Chest x-ray showed small bilateral pleural effusions with basilar atelectasis. EKG showed normal sinus rhythm with heart rate of 70s, nonspecific ST-T wave changes, incomplete right bundle branch block. The pt was seen by Dr Meda Coffee and admitted  for evaluation of CHF and suspected pericarditis.                     An MRI was ordered and the pt was started on IV Lasix. MRI did not suggest pericarditis. Echo showed normal LVF. Final blood culture report is still pending at discharge. The pt has been afebrile. Swallowing study done 09/08/13 recommended Dysphagia 3 (Mechanical Soft);Thin liquid. The pt diuresed 4.9L. Dr Mare Ferrari feels he can be discharge 09/10/13.      Discharge Vitals:  Blood pressure 124/62, pulse 73, temperature 98.3 F (36.8 C), temperature source Oral, resp. rate 18, height 5\' 11"  (1.803 m), weight 177 lb 7.5 oz (80.5 kg), SpO2 94.00%.    Labs: Results for orders placed during the hospital encounter of 09/08/13 (from the past 24 hour(s))  BASIC METABOLIC PANEL     Status: Abnormal   Collection Time    09/10/13  4:19 AM      Result Value Ref Range   Sodium 139  137 - 147 mEq/L   Potassium 3.7  3.7 - 5.3 mEq/L   Chloride 101  96 - 112 mEq/L   CO2 23  19 - 32 mEq/L   Glucose, Bld 95  70 - 99 mg/dL   BUN 11  6 - 23 mg/dL   Creatinine, Ser 1.14  0.50 - 1.35 mg/dL   Calcium 8.2 (*) 8.4 - 10.5 mg/dL   GFR calc non Af Amer 61 (*) >90 mL/min   GFR calc Af Amer 70 (*) >90 mL/min   Anion gap 15  5 - 15  PRO B NATRIURETIC PEPTIDE     Status: Abnormal   Collection Time    09/10/13  4:19 AM      Result Value Ref Range   Pro B Natriuretic peptide (BNP) 9146.0 (*) 0 - 450 pg/mL    Disposition:  Follow-up Information   Follow up with Dorothy Spark, MD. (office will call you)    Specialty:  Cardiology   Contact information:   [address] [place] [id number] [phone]       Discharge Medications:    Medication List         acetaminophen 325 MG tablet  Commonly known as:  TYLENOL  Take 2 tablets (650 mg total) by mouth every 4 (four) hours as needed for headache or mild pain.     albuterol (5 MG/ML) 0.5% nebulizer solution  Commonly known as:  PROVENTIL  Take 2.5 mg by nebulization every 6 (six) hours as needed for wheezing or shortness of breath.     aspirin 81 MG chewable tablet  Chew 81 mg by mouth daily.     atorvastatin 80 MG tablet  Commonly known as:  LIPITOR  Take 80 mg by mouth every evening.     clopidogrel 75 MG tablet  Commonly known as:  PLAVIX  Take 75 mg by mouth daily.     furosemide 40 MG tablet  Commonly known as:  LASIX  Take 1 tablet (40 mg total) by mouth  daily.  Start taking on:  09/11/2013     ipratropium 0.02 % nebulizer solution  Commonly known as:  ATROVENT  Take 0.5 mg by nebulization every 6 (six) hours as needed for wheezing or shortness of breath.     isosorbide mononitrate 60 MG 24 hr tablet  Commonly known as:  IMDUR  Take 60 mg by mouth daily.     lansoprazole 30 MG  disintegrating tablet  Commonly known as:  PREVACID SOLUTAB  Take 30 mg by mouth daily.     levothyroxine 175 MCG tablet  Commonly known as:  SYNTHROID, LEVOTHROID  Take 175 mcg by mouth daily.     losartan 25 MG tablet  Commonly known as:  COZAAR  Take 1 tablet (25 mg total) by mouth daily.     metoprolol 50 MG tablet  Commonly known as:  LOPRESSOR  Take 50 mg by mouth 2 (two) times daily.         Duration of Discharge Encounter: Greater than 30 minutes including physician time.  Angelena Form PA-C 09/10/2013 1:06 PM

## 2013-09-10 NOTE — Progress Notes (Signed)
Patient Name: Taylor Keller Date of Encounter: 09/10/2013     Principal Problem:   Chest pain at rest Active Problems:   Hypothyroidism   Hyperlipidemia   Hypertension   Internal carotid artery stenosis   H/O cardiac arrest July 2015   CAD- s/p LAD and CFX stent July 2015   Acute CHF    SUBJECTIVE  The patient is feeling well today, anxious to go home. No further chest pain. No dyspnea. Peripheral edema has cleared with lasix. Weight down 7 lb.  2D echo and MRI show normal LV systolic function. Seen by speech therapy. They recommend dysphagia 3 diet with thin liquids.  CURRENT MEDS . antiseptic oral rinse  7 mL Mouth Rinse BID  . aspirin  81 mg Oral Daily  . atorvastatin  80 mg Oral QPM  . clopidogrel  75 mg Oral Daily  . furosemide  40 mg Intravenous BID  . heparin  5,000 Units Subcutaneous 3 times per day  . isosorbide mononitrate  60 mg Oral Daily  . levothyroxine  175 mcg Oral QAC breakfast  . losartan  25 mg Oral Daily  . metoprolol  50 mg Oral BID  . pantoprazole sodium  40 mg Oral Daily  . sodium chloride  3 mL Intravenous Q12H    OBJECTIVE  Filed Vitals:   09/09/13 1343 09/09/13 2102 09/10/13 0625 09/10/13 0942  BP: 120/52 147/60 127/46 124/62  Pulse: 69 68 73   Temp: 98.6 F (37 C) 99.5 F (37.5 C) 98.3 F (36.8 C)   TempSrc: Oral Oral Oral   Resp: 18 18 18    Height:      Weight:   177 lb 7.5 oz (80.5 kg)   SpO2: 94% 97% 94%     Intake/Output Summary (Last 24 hours) at 09/10/13 1146 Last data filed at 09/10/13 1100  Gross per 24 hour  Intake    680 ml  Output   3260 ml  Net  -2580 ml   Filed Weights   09/08/13 2328 09/09/13 0437 09/10/13 0625  Weight: 184 lb 6.4 oz (83.643 kg) 184 lb 4.9 oz (83.6 kg) 177 lb 7.5 oz (80.5 kg)    PHYSICAL EXAM  General: Pleasant, NAD. Neuro: Alert and oriented X 3. Moves all extremities spontaneously. Psych: Normal affect. HEENT:  Normal  Neck: Supple without bruits or JVD. Lungs:  Resp regular and  unlabored, CTA. Heart: RRR no s3, s4, or murmurs. Abdomen: Soft, non-tender, non-distended, BS + x 4.  Extremities: No clubbing, cyanosis or edema. DP/PT/Radials 2+ and equal bilaterally.  Accessory Clinical Findings  CBC  Recent Labs  09/08/13 0525 09/08/13 1003  WBC 8.3 8.0  HGB 9.5* 9.2*  HCT 30.4* 29.4*  MCV 86.6 87.0  PLT 433* 798   Basic Metabolic Panel  Recent Labs  09/09/13 0424 09/10/13 0419  NA 140 139  K 3.9 3.7  CL 104 101  CO2 25 23  GLUCOSE 94 95  BUN 10 11  CREATININE 1.19 1.14  CALCIUM 8.2* 8.2*   Liver Function Tests No results found for this basename: AST, ALT, ALKPHOS, BILITOT, PROT, ALBUMIN,  in the last 72 hours No results found for this basename: LIPASE, AMYLASE,  in the last 72 hours Cardiac Enzymes  Recent Labs  09/08/13 1103 09/08/13 1634 09/08/13 2250  TROPONINI <0.30 <0.30 <0.30   BNP No components found with this basename: POCBNP,  D-Dimer No results found for this basename: DDIMER,  in the last 72 hours Hemoglobin A1C No  results found for this basename: HGBA1C,  in the last 72 hours Fasting Lipid Panel No results found for this basename: CHOL, HDL, LDLCALC, TRIG, CHOLHDL, LDLDIRECT,  in the last 72 hours Thyroid Function Tests No results found for this basename: TSH, T4TOTAL, FREET3, T3FREE, THYROIDAB,  in the last 72 hours  TELE  NSR. Occasional PVC.  ECG    Radiology/Studies  Dg Chest 2 View  09/08/2013   CLINICAL DATA:  Right-sided chest pain  EXAM: CHEST  2 VIEW  COMPARISON:  03/14/2012  FINDINGS: Borderline cardiomegaly, with the lower heart obscured by the pulmonary process. Coronary stents noted. Negative upper mediastinal contours.  There are small bilateral pleural effusions with basilar lung opacity. The symmetry and history favors atelectasis. No evidence of pneumothorax. Pulmonary venous congestion, best appreciated in the lateral projection.  IMPRESSION: Small bilateral pleural effusion with basilar  atelectasis.   Electronically Signed   By: Jorje Guild M.D.   On: 09/08/2013 06:50   Dg Swallowing Func-speech Pathology  09/08/2013   Joaquim Nam, CCC-SLP     09/08/2013  2:52 PM Objective Swallowing Evaluation: Modified Barium Swallowing Study   Patient Details  Name: Taylor Keller MRN: [id number] Date of Birth: 11-Dec-1935  Today's Date: 09/08/2013 Time: 1400-1440 SLP Time Calculation (min): 40 min  Past Medical History:  Past Medical History  Diagnosis Date  . Lumbar stenosis     L5  . Hypothyroidism   . Hyperlipidemia   . Hypertension   . ED (erectile dysfunction)   . Internal carotid artery stenosis 5/15    bilateral, 40-59%  . Myocardial infarction 08/2013  . Heart murmur   . Stroke ~ 2011    "slight; no permanent damage"   Past Surgical History:  Past Surgical History  Procedure Laterality Date  . Tonsillectomy and adenoidectomy    . Appendectomy    . Coronary angioplasty with stent placement  08/2013    "2"  . Cataract extraction, bilateral Bilateral ~ 2012   HPI:  78 yo male with recent MI and cardiac arrest who was discharged  from [facility] general hospital on 7/31 came in with SOB and LE  edema.  PMH: Dysphagia with silent aspiration s/p extubation  (intubated x7 days).  Pt was d/c'd home on Honey thick liquids,  and has complied with this restriction at home, per pt and his  wife.  H/O GERD     Assessment / Plan / Recommendation Clinical Impression  Dysphagia Diagnosis: Mild pharyngeal phase dysphagia Clinical impression: Patient currently exhibits only a mild  sensori-motor pharyngeal dysphagia, characterized by decreased  base of tongue contraction to the pharyngeal wall and decreased  laryngeal elevation and epiglottic deflection.  These deficits  result in trace penetration of thin liquids when using a straw,  and moderate vallecular residue with all solid consistencies.   Effective compensatory strategies:  Chin tuck with every swallow,  and swallow twice to clear residue.    Treatment  Recommendation  Therapy as outlined in treatment plan below    Diet Recommendation Dysphagia 3 (Mechanical Soft);Thin liquid   Liquid Administration via: No straw;Cup Medication Administration: Whole meds with puree Supervision: Patient able to self feed;Full supervision/cueing  for compensatory strategies Compensations: Slow rate;Small sips/bites Postural Changes and/or Swallow Maneuvers: Seated upright 90  degrees;Chin tuck    Other  Recommendations Oral Care Recommendations: Oral care  BID;Patient independent with oral care Other Recommendations: Clarify dietary restrictions   Follow Up Recommendations  None    Frequency and Duration  min 2x/week  2 weeks   Pertinent Vitals/Pain n/a         General HPI: 78 yo male with recent MI and cardiac arrest who was  discharged from [facility] general hospital on 7/31 came in with  SOB and LE edema.  PMH: Dysphagia with silent aspiration s/p  extubation (intubated x7 days).  Pt was d/c'd home on Honey thick  liquids, and has complied with this restriction at home, per pt  and his wife.  H/O GERD Type of Study: Modified Barium Swallowing Study Reason for Referral: Objectively evaluate swallowing function Previous Swallow Assessment: Previous MBS at [facility].   Positive silent aspiration of thin and nectar thick liquids. Diet Prior to this Study: Dysphagia 2 (chopped);Honey-thick  liquids Temperature Spikes Noted: Yes (low-grade fever) Respiratory Status: Room air History of Recent Intubation: Yes Length of Intubations (days): 7 days Behavior/Cognition: Alert;Cooperative;Pleasant mood Oral Cavity - Dentition: Adequate natural dentition Oral Motor / Sensory Function: Within functional limits Self-Feeding Abilities: Able to feed self Patient Positioning: Upright in chair Baseline Vocal Quality: Hoarse;Low vocal intensity (Frequent  throat clearing) Volitional Cough: Weak Volitional Swallow: Able to elicit Anatomy: Within functional limits Pharyngeal Secretions: Not  observed secondary MBS    Reason for Referral Objectively evaluate swallowing function   Oral Phase Oral Preparation/Oral Phase Oral Phase: WFL   Pharyngeal Phase Pharyngeal Phase Pharyngeal Phase: Impaired Pharyngeal - Thin Pharyngeal - Thin Straw: Penetration/Aspiration during swallow Penetration/Aspiration details (thin straw): Material enters  airway, remains ABOVE vocal cords then ejected out Pharyngeal - Solids Pharyngeal - Puree: Reduced tongue base retraction;Pharyngeal  residue - valleculae;Compensatory strategies attempted (Comment)  (Chin tuck and double swallows ) Pharyngeal - Regular: Reduced tongue base retraction;Pharyngeal  residue - valleculae;Compensatory strategies attempted (Comment)  (Chin tuck and double swallows) Pharyngeal - Pill: Reduced tongue base retraction;Pharyngeal  residue - valleculae (chin tuck and repeat swallow with puree)  Cervical Esophageal Phase    GO    Cervical Esophageal Phase Cervical Esophageal Phase: Impaired Cervical Esophageal Phase - Thin Thin Cup: Esophageal backflow into cervical esophagus (osteophyte  noted, with backflow of liquids )         Quinn Axe T 09/08/2013, 2:51 PM    Mr Card Morphology Wo/w Cm  09/09/2013   CLINICAL DATA:  S/P cardiac arrest, S/P PCI to LAD and LCX, presenting with positional chest pain. Evaluate for viability and pericarditis.  EXAM: CARDIAC MRI  TECHNIQUE: The patient was scanned on a 1.5 Tesla GE magnet. A dedicated cardiac coil was used. Functional imaging was done using Fiesta sequences. 2,3, and 4 chamber views were done to assess for RWMA's. Modified Simpson's rule using a short axis stack was used to calculate an ejection fraction on a dedicated work Conservation officer, nature. The patient received 27 cc of Multihance. After 10 minutes inversion recovery sequences were used to assess for infiltration and scar tissue.  CONTRAST:  27 cc  of Multihance  FINDINGS: 1. Mildly dilated left ventricle with mild concentric LVH and  normal systolic function (LVEF 68%). No regional wall motion abnormalities.  The measurements are as follows:  LVEDD:  59 mm  LVESD:  37 mm  Basal anteroseptal wall thickness:  12 mm  Basal inferolateral wall thickness:  11 mm  LVEDV:  245 ml  LVESV:  104 ml  SV:  141 ml  Myocardial mass:  201 g  HR 74 BPM  2. Mildly dilated right ventricle with normal thickness and function (RVEF 51%).  The measurements are  as follows:  RVEDV:  202 ml  RVESV:  98 ml  SV:  104 ml  3. Mildly to moderately dilated left atrium (45 mm), mildly dilated right atrium.  4. No late gadolinium enhancement in the myocardium of the left and right ventricle or in the pericardium.  5. Mild mitral and tricuspid regurgitation. Mild to moderate aortic regurgitation when assessed visually. Quantification not performed.  6. Normal size aortic root, ascending aorta. Dilated pulmonary artery (37 mm).  IMPRESSION: 1. Mildly dilated left ventricle with mild concentric LVH and normal systolic function (LVEF 35%). No regional wall motion abnormalities.  2. Mildly dilated right ventricle with normal thickness and function (RVEF 51%).  3. Mildly to moderately dilated left atrium (45 mm), mildly dilated right atrium.  4. Mild mitral and tricuspid regurgitation. Mild to moderate aortic regurgitation.  5. No late gadolinium enhancement in the myocardium of the left and right ventricle consistent with normal viability.  6. No evidence of acute pericarditis.  7. Dilated pulmonary artery (37 mm), dilated IVC (26 mm) suggestive of pulmonary hypertension and elevated CVP.  Ena Dawley   Electronically Signed   By: Ena Dawley   On: 09/09/2013 10:43    ASSESSMENT AND PLAN Chest pain at rest resolved Active Problems:  CAD- s/p LAD and CFX stent July 2015  Acute CHF resolved. H/O cardiac arrest July 2015  Hypothyroidism  Hyperlipidemia  Hypertension  Internal carotid artery stenosis  Plan: okay for discharge today. Will send home on low dose  lasix. See in office for followup in 1 week by Dr. Meda Coffee or PA and get followup BMET at that time. Home health RN and PT. When stronger transition to CRP phase 2.  Signed, Darlin Coco MD

## 2013-09-10 NOTE — Discharge Instructions (Signed)

## 2013-09-12 ENCOUNTER — Telehealth: Payer: Self-pay | Admitting: Cardiology

## 2013-09-12 MED ORDER — NITROGLYCERIN 0.4 MG SL SUBL: 0.4000 mg | SUBLINGUAL_TABLET | SUBLINGUAL | Status: AC | PRN

## 2013-09-12 NOTE — Telephone Encounter (Signed)
Patient phoned regarding recent hospitalization. Patient states he is doing well but had some questions.  1) Sould he have Rx for NTG SL 2) O to wait until August 25 for follow up? 3) Can he travel to [place], [place] 10/16/13 for 10 days 4) Trouble sleeping times 2 nights. Could Atorvastatin and Metoprolol be causing? Could he change back to Simvastatin? Ok to try Benadryl OTC?  Discussed with  Dr. Mare Ferrari since he saw patient in the hospital. Yes should Rx NTG, ok to wait until 09/30/13 for follow up if he is doing ok, try taking Atorvastatin in am to see if that will help with sleep, and ok to take Benadryl 25 mg hs prn.  Advised patient, verbalized understanding. Dr. Mare Ferrari was recommended cancelling trip he would prefer to discuss at August 25 follow up with Dr Meda Coffee.

## 2013-09-12 NOTE — Telephone Encounter (Signed)
°  Patient was released from hospital Wednesday, and has questions. Please call and advise.

## 2013-09-14 LAB — CULTURE, BLOOD (ROUTINE X 2)
Culture: NO GROWTH
Culture: NO GROWTH

## 2013-09-15 ENCOUNTER — Telehealth: Payer: Self-pay | Admitting: Family Medicine

## 2013-09-15 MED ORDER — ZOLPIDEM TARTRATE 5 MG PO TABS: 5.0000 mg | ORAL_TABLET | Freq: Every evening | ORAL | Status: DC | PRN

## 2013-09-15 NOTE — Telephone Encounter (Signed)
Pt would like something to help him sleep

## 2013-09-15 NOTE — Telephone Encounter (Signed)
Try ambien 5 poqhs for insomnia.

## 2013-09-15 NOTE — Telephone Encounter (Signed)
Pt aware of medication to take every night as needed

## 2013-09-15 NOTE — Telephone Encounter (Signed)
Pt has called and scheduled a hospital f/u and he states he is not sleeping well at night and has not energy he takes a metoprolol (LOPRESSOR) 50 MG tablet before bed every night and he is wanting to know if that could be the reason, as well as if he could have something to help him sleep

## 2013-09-16 NOTE — Telephone Encounter (Signed)
Faxed pts cardiac rehab orders on 09/16/13 to [phone].

## 2013-09-19 ENCOUNTER — Encounter: Payer: Self-pay | Admitting: Family Medicine

## 2013-09-19 ENCOUNTER — Other Ambulatory Visit: Payer: Self-pay | Admitting: Family Medicine

## 2013-09-19 ENCOUNTER — Ambulatory Visit (INDEPENDENT_AMBULATORY_CARE_PROVIDER_SITE_OTHER): Payer: Medicare HMO | Admitting: Family Medicine

## 2013-09-19 VITALS — BP 110/40 | HR 64 | Temp 98.5°F | Resp 16 | Ht 72.0 in | Wt 174.0 lb

## 2013-09-19 DIAGNOSIS — Z09 Encounter for follow-up examination after completed treatment for conditions other than malignant neoplasm: Secondary | ICD-10-CM

## 2013-09-19 NOTE — Progress Notes (Signed)
Subjective:    Patient ID: Taylor Keller, male    DOB: 1935/07/29, 78 y.o.   MRN: [id number]  HPI  Patient is a very pleasant 78 year old white male experienced cardiac arrest while in [place].  Patient underwent cardiac defibrillation and normal sinus rhythm was recovered. He spent one week in ICU in [place]. At that time he received drug-eluting stents in his left anterior descending artery as well as left circumflex. The patient's report, he has a 50% stenosis in his right coronary artery is being managed medically. After discharge from the hospital in [place] he was admitted to [facility] with acute congestive heart failure. Chest x-ray showed bilateral pleural effusions. Echocardiogram at that time revealed normal systolic function with an ejection fraction of 55-60% with no wall motion abnormalities. The patient was discharged on Lasix daily. Since discharge from hospital his weight is maintained at 174 pounds every day. He denies any shortness of breath or dyspnea on exertion. He does complain of some fatigue but this is slowly improving. He denies any angina. Past Medical History  Diagnosis Date  . Lumbar stenosis     L5  . Hypothyroidism   . Hyperlipidemia   . Hypertension   . ED (erectile dysfunction)   . Internal carotid artery stenosis 5/15    bilateral, 40-59%  . Myocardial infarction 08/2013  . Heart murmur   . Stroke ~ 2011    "slight; no permanent damage"   Past Surgical History  Procedure Laterality Date  . Tonsillectomy and adenoidectomy    . Appendectomy    . Coronary angioplasty with stent placement  08/2013    "2"  . Cataract extraction, bilateral Bilateral ~ 2012   Current Outpatient Prescriptions on File Prior to Visit  Medication Sig Dispense Refill  . acetaminophen (TYLENOL) 325 MG tablet Take 2 tablets (650 mg total) by mouth every 4 (four) hours as needed for headache or mild pain.      Marland Kitchen albuterol (PROVENTIL) (5 MG/ML) 0.5% nebulizer solution Take  2.5 mg by nebulization every 6 (six) hours as needed for wheezing or shortness of breath.      Marland Kitchen aspirin 81 MG chewable tablet Chew 81 mg by mouth daily.      Marland Kitchen atorvastatin (LIPITOR) 80 MG tablet Take 80 mg by mouth every evening.      . clopidogrel (PLAVIX) 75 MG tablet Take 75 mg by mouth daily.      . diphenhydrAMINE (BENADRYL) 25 MG tablet Take 25 mg by mouth every 6 (six) hours as needed.      . furosemide (LASIX) 40 MG tablet Take 1 tablet (40 mg total) by mouth daily.  30 tablet  11  . ipratropium (ATROVENT) 0.02 % nebulizer solution Take 0.5 mg by nebulization every 6 (six) hours as needed for wheezing or shortness of breath.      . isosorbide mononitrate (IMDUR) 60 MG 24 hr tablet Take 60 mg by mouth daily.      . lansoprazole (PREVACID SOLUTAB) 30 MG disintegrating tablet Take 30 mg by mouth daily.      Marland Kitchen levothyroxine (SYNTHROID, LEVOTHROID) 175 MCG tablet Take 175 mcg by mouth daily.      Marland Kitchen losartan (COZAAR) 25 MG tablet Take 1 tablet (25 mg total) by mouth daily.  30 tablet  11  . metoprolol (LOPRESSOR) 50 MG tablet Take 50 mg by mouth 2 (two) times daily.      . nitroGLYCERIN (NITROSTAT) 0.4 MG SL tablet Place 1 tablet (0.4  mg total) under the tongue every 5 (five) minutes as needed for chest pain.  25 tablet  prn  . zolpidem (AMBIEN) 5 MG tablet Take 1 tablet (5 mg total) by mouth at bedtime as needed for sleep.  30 tablet  1   No current facility-administered medications on file prior to visit.   Allergies  Allergen Reactions  . Benazepril Cough   History   Social History  . Marital Status: Married    Spouse Name: N/A    Number of Children: N/A  . Years of Education: N/A   Occupational History  . Not on file.   Social History Main Topics  . Smoking status: Never Smoker   . Smokeless tobacco: Never Used  . Alcohol Use: Yes     Comment: 09/08/2013 "couple glasses of wine once/month"  . Drug Use: No  . Sexual Activity: Yes   Other Topics Concern  . Not on file    Social History Narrative  . No narrative on file   Family History  Problem Relation Age of Onset  . Cancer Sister     colon    Review of Systems  All other systems reviewed and are negative.      Objective:   Physical Exam  Vitals reviewed. Cardiovascular: Normal rate, regular rhythm and normal heart sounds.  Exam reveals no gallop and no friction rub.   No murmur heard. Pulmonary/Chest: Effort normal and breath sounds normal. No respiratory distress. He has no wheezes. He has no rales. He exhibits no tenderness.  Abdominal: Soft. Bowel sounds are normal. He exhibits no distension. There is no tenderness. There is no rebound and no guarding.  Musculoskeletal: He exhibits no edema.          Assessment & Plan:  1. Hospital discharge follow-up Patient's blood pressure is stable today. He appears euvolemic. He does not appear dehydrated over diuresed. I recommended that he continues Lasix at the present dose. I also recommend he check his weight every day and if he sees a substantial drop in his weight, we may need to discontinue Lasix drip and dehydration. Check a CBC as well as a CMP. I encouraged patient to follow with cardiology as planned. - CBC with Differential - COMPLETE METABOLIC PANEL WITH GFR

## 2013-09-20 LAB — CBC WITH DIFFERENTIAL/PLATELET
BASOS PCT: 0 % (ref 0–1)
Basophils Absolute: 0 10*3/uL (ref 0.0–0.1)
Eosinophils Absolute: 0.1 10*3/uL (ref 0.0–0.7)
Eosinophils Relative: 2 % (ref 0–5)
HCT: 29.6 % — ABNORMAL LOW (ref 39.0–52.0)
Hemoglobin: 9.6 g/dL — ABNORMAL LOW (ref 13.0–17.0)
Lymphocytes Relative: 15 % (ref 12–46)
Lymphs Abs: 1.1 10*3/uL (ref 0.7–4.0)
MCH: 26.9 pg (ref 26.0–34.0)
MCHC: 32.4 g/dL (ref 30.0–36.0)
MCV: 82.9 fL (ref 78.0–100.0)
Monocytes Absolute: 0.6 10*3/uL (ref 0.1–1.0)
Monocytes Relative: 8 % (ref 3–12)
NEUTROS PCT: 75 % (ref 43–77)
Neutro Abs: 5.3 10*3/uL (ref 1.7–7.7)
PLATELETS: 361 10*3/uL (ref 150–400)
RBC: 3.57 MIL/uL — AB (ref 4.22–5.81)
RDW: 17.6 % — AB (ref 11.5–15.5)
WBC: 7 10*3/uL (ref 4.0–10.5)

## 2013-09-20 LAB — COMPLETE METABOLIC PANEL WITH GFR
ALK PHOS: 71 U/L (ref 39–117)
ALT: 15 U/L (ref 0–53)
AST: 16 U/L (ref 0–37)
Albumin: 3.1 g/dL — ABNORMAL LOW (ref 3.5–5.2)
BILIRUBIN TOTAL: 0.3 mg/dL (ref 0.2–1.2)
BUN: 14 mg/dL (ref 6–23)
CO2: 27 meq/L (ref 19–32)
CREATININE: 1.12 mg/dL (ref 0.50–1.35)
Calcium: 8.4 mg/dL (ref 8.4–10.5)
Chloride: 97 mEq/L (ref 96–112)
GFR, EST NON AFRICAN AMERICAN: 63 mL/min
GFR, Est African American: 73 mL/min
Glucose, Bld: 128 mg/dL — ABNORMAL HIGH (ref 70–99)
Potassium: 4.6 mEq/L (ref 3.5–5.3)
SODIUM: 135 meq/L (ref 135–145)
TOTAL PROTEIN: 6.7 g/dL (ref 6.0–8.3)

## 2013-09-24 LAB — IRON: Iron: 11 ug/dL — ABNORMAL LOW (ref 42–165)

## 2013-09-24 LAB — VITAMIN B12: Vitamin B-12: 381 pg/mL (ref 211–911)

## 2013-09-24 LAB — FOLATE: FOLATE: 3.4 ng/mL

## 2013-09-25 ENCOUNTER — Inpatient Hospital Stay (HOSPITAL_COMMUNITY)
Admission: EM | Admit: 2013-09-25 | Discharge: 2013-09-30 | DRG: 064 | Disposition: A | Discharge status: Rehabilitation Facility | Payer: Medicare HMO | Attending: Neurology | Admitting: Neurology

## 2013-09-25 ENCOUNTER — Inpatient Hospital Stay (HOSPITAL_COMMUNITY): Payer: Medicare HMO

## 2013-09-25 ENCOUNTER — Emergency Department (HOSPITAL_COMMUNITY): Payer: Medicare HMO

## 2013-09-25 ENCOUNTER — Ambulatory Visit (HOSPITAL_COMMUNITY): Payer: Medicare HMO

## 2013-09-25 ENCOUNTER — Encounter (HOSPITAL_COMMUNITY): Payer: Self-pay | Admitting: Emergency Medicine

## 2013-09-25 DIAGNOSIS — I6350 Cerebral infarction due to unspecified occlusion or stenosis of unspecified cerebral artery: Secondary | ICD-10-CM | POA: Diagnosis present

## 2013-09-25 DIAGNOSIS — Z79899 Other long term (current) drug therapy: Secondary | ICD-10-CM

## 2013-09-25 DIAGNOSIS — Z8674 Personal history of sudden cardiac arrest: Secondary | ICD-10-CM

## 2013-09-25 DIAGNOSIS — I614 Nontraumatic intracerebral hemorrhage in cerebellum: Secondary | ICD-10-CM

## 2013-09-25 DIAGNOSIS — I4901 Ventricular fibrillation: Secondary | ICD-10-CM | POA: Diagnosis present

## 2013-09-25 DIAGNOSIS — I252 Old myocardial infarction: Secondary | ICD-10-CM

## 2013-09-25 DIAGNOSIS — I6529 Occlusion and stenosis of unspecified carotid artery: Secondary | ICD-10-CM | POA: Diagnosis present

## 2013-09-25 DIAGNOSIS — I615 Nontraumatic intracerebral hemorrhage, intraventricular: Secondary | ICD-10-CM

## 2013-09-25 DIAGNOSIS — I619 Nontraumatic intracerebral hemorrhage, unspecified: Principal | ICD-10-CM

## 2013-09-25 DIAGNOSIS — Z7902 Long term (current) use of antithrombotics/antiplatelets: Secondary | ICD-10-CM | POA: Diagnosis not present

## 2013-09-25 DIAGNOSIS — H5500 Unspecified nystagmus: Secondary | ICD-10-CM | POA: Diagnosis present

## 2013-09-25 DIAGNOSIS — I10 Essential (primary) hypertension: Secondary | ICD-10-CM

## 2013-09-25 DIAGNOSIS — E039 Hypothyroidism, unspecified: Secondary | ICD-10-CM | POA: Diagnosis present

## 2013-09-25 DIAGNOSIS — G919 Hydrocephalus, unspecified: Secondary | ICD-10-CM

## 2013-09-25 DIAGNOSIS — Z9861 Coronary angioplasty status: Secondary | ICD-10-CM | POA: Diagnosis not present

## 2013-09-25 DIAGNOSIS — I2510 Atherosclerotic heart disease of native coronary artery without angina pectoris: Secondary | ICD-10-CM | POA: Diagnosis present

## 2013-09-25 DIAGNOSIS — Z7982 Long term (current) use of aspirin: Secondary | ICD-10-CM | POA: Diagnosis not present

## 2013-09-25 DIAGNOSIS — E785 Hyperlipidemia, unspecified: Secondary | ICD-10-CM

## 2013-09-25 DIAGNOSIS — Z5189 Encounter for other specified aftercare: Secondary | ICD-10-CM | POA: Diagnosis not present

## 2013-09-25 DIAGNOSIS — R29810 Facial weakness: Secondary | ICD-10-CM | POA: Diagnosis present

## 2013-09-25 DIAGNOSIS — G911 Obstructive hydrocephalus: Secondary | ICD-10-CM | POA: Diagnosis present

## 2013-09-25 DIAGNOSIS — I509 Heart failure, unspecified: Secondary | ICD-10-CM | POA: Diagnosis present

## 2013-09-25 LAB — COMPREHENSIVE METABOLIC PANEL
ALK PHOS: 80 U/L (ref 39–117)
ALT: 20 U/L (ref 0–53)
AST: 17 U/L (ref 0–37)
Albumin: 2.7 g/dL — ABNORMAL LOW (ref 3.5–5.2)
Anion gap: 13 (ref 5–15)
BUN: 18 mg/dL (ref 6–23)
CALCIUM: 9.1 mg/dL (ref 8.4–10.5)
CO2: 24 mEq/L (ref 19–32)
Chloride: 96 mEq/L (ref 96–112)
Creatinine, Ser: 1.08 mg/dL (ref 0.50–1.35)
GFR calc non Af Amer: 65 mL/min — ABNORMAL LOW (ref >90)
GFR, EST AFRICAN AMERICAN: 75 mL/min — AB (ref >90)
GLUCOSE: 182 mg/dL — AB (ref 70–99)
POTASSIUM: 4.1 meq/L (ref 3.7–5.3)
SODIUM: 133 meq/L — AB (ref 137–147)
Total Bilirubin: 0.2 mg/dL — ABNORMAL LOW (ref 0.3–1.2)
Total Protein: 7.8 g/dL (ref 6.0–8.3)

## 2013-09-25 LAB — CBG MONITORING, ED: Glucose-Capillary: 170 mg/dL — ABNORMAL HIGH (ref 70–99)

## 2013-09-25 LAB — I-STAT CHEM 8, ED
BUN: 18 mg/dL (ref 6–23)
Calcium, Ion: 1.12 mmol/L — ABNORMAL LOW (ref 1.13–1.30)
Chloride: 101 mEq/L (ref 96–112)
Creatinine, Ser: 1.2 mg/dL (ref 0.50–1.35)
GLUCOSE: 189 mg/dL — AB (ref 70–99)
HCT: 30 % — ABNORMAL LOW (ref 39.0–52.0)
HEMOGLOBIN: 10.2 g/dL — AB (ref 13.0–17.0)
Potassium: 4 mEq/L (ref 3.7–5.3)
Sodium: 132 mEq/L — ABNORMAL LOW (ref 137–147)
TCO2: 24 mmol/L (ref 0–100)

## 2013-09-25 LAB — URINALYSIS, ROUTINE W REFLEX MICROSCOPIC
Bilirubin Urine: NEGATIVE
Glucose, UA: NEGATIVE mg/dL
Ketones, ur: NEGATIVE mg/dL
LEUKOCYTES UA: NEGATIVE
NITRITE: NEGATIVE
PH: 6.5 (ref 5.0–8.0)
Protein, ur: 30 mg/dL — AB
SPECIFIC GRAVITY, URINE: 1.018 (ref 1.005–1.030)
Urobilinogen, UA: 0.2 mg/dL (ref 0.0–1.0)

## 2013-09-25 LAB — BASIC METABOLIC PANEL
Anion gap: 14 (ref 5–15)
BUN: 15 mg/dL (ref 6–23)
CO2: 24 mEq/L (ref 19–32)
CREATININE: 0.96 mg/dL (ref 0.50–1.35)
Calcium: 9.2 mg/dL (ref 8.4–10.5)
Chloride: 95 mEq/L — ABNORMAL LOW (ref 96–112)
GFR calc Af Amer: >90 mL/min (ref >90)
GFR, EST NON AFRICAN AMERICAN: 79 mL/min — AB (ref >90)
GLUCOSE: 118 mg/dL — AB (ref 70–99)
POTASSIUM: 4.5 meq/L (ref 3.7–5.3)
Sodium: 133 mEq/L — ABNORMAL LOW (ref 137–147)

## 2013-09-25 LAB — I-STAT TROPONIN, ED: Troponin i, poc: 0.03 ng/mL (ref 0.00–0.08)

## 2013-09-25 LAB — DIFFERENTIAL
Basophils Absolute: 0 10*3/uL (ref 0.0–0.1)
Basophils Relative: 0 % (ref 0–1)
EOS PCT: 1 % (ref 0–5)
Eosinophils Absolute: 0.1 10*3/uL (ref 0.0–0.7)
LYMPHS ABS: 0.8 10*3/uL (ref 0.7–4.0)
LYMPHS PCT: 9 % — AB (ref 12–46)
Monocytes Absolute: 0.7 10*3/uL (ref 0.1–1.0)
Monocytes Relative: 8 % (ref 3–12)
Neutro Abs: 7 10*3/uL (ref 1.7–7.7)
Neutrophils Relative %: 82 % — ABNORMAL HIGH (ref 43–77)

## 2013-09-25 LAB — URINE MICROSCOPIC-ADD ON

## 2013-09-25 LAB — PROTIME-INR
INR: 1.38 (ref 0.00–1.49)
PROTHROMBIN TIME: 17 s — AB (ref 11.6–15.2)

## 2013-09-25 LAB — CBC
HCT: 30.4 % — ABNORMAL LOW (ref 39.0–52.0)
HEMATOCRIT: 29.1 % — AB (ref 39.0–52.0)
HEMOGLOBIN: 9 g/dL — AB (ref 13.0–17.0)
Hemoglobin: 9.3 g/dL — ABNORMAL LOW (ref 13.0–17.0)
MCH: 26.9 pg (ref 26.0–34.0)
MCH: 26.5 pg (ref 26.0–34.0)
MCHC: 30.6 g/dL (ref 30.0–36.0)
MCHC: 30.9 g/dL (ref 30.0–36.0)
MCV: 85.8 fL (ref 78.0–100.0)
MCV: 87.9 fL (ref 78.0–100.0)
PLATELETS: 297 10*3/uL (ref 150–400)
Platelets: 281 10*3/uL (ref 150–400)
RBC: 3.46 MIL/uL — ABNORMAL LOW (ref 4.22–5.81)
RBC: 3.39 MIL/uL — ABNORMAL LOW (ref 4.22–5.81)
RDW: 17.2 % — ABNORMAL HIGH (ref 11.5–15.5)
RDW: 17 % — ABNORMAL HIGH (ref 11.5–15.5)
WBC: 8 10*3/uL (ref 4.0–10.5)
WBC: 8.6 10*3/uL (ref 4.0–10.5)

## 2013-09-25 LAB — RAPID URINE DRUG SCREEN, HOSP PERFORMED
Amphetamines: NOT DETECTED
BARBITURATES: NOT DETECTED
Benzodiazepines: NOT DETECTED
Cocaine: NOT DETECTED
Opiates: NOT DETECTED
Tetrahydrocannabinol: NOT DETECTED

## 2013-09-25 LAB — MRSA PCR SCREENING: MRSA BY PCR: NEGATIVE

## 2013-09-25 LAB — APTT: aPTT: 37 seconds (ref 24–37)

## 2013-09-25 LAB — TROPONIN I: Troponin I: <0.3 ng/mL (ref <0.30)

## 2013-09-25 LAB — I-STAT CG4 LACTIC ACID, ED: LACTIC ACID, VENOUS: 0.98 mmol/L (ref 0.5–2.2)

## 2013-09-25 LAB — HEMOGLOBIN A1C
Hgb A1c MFr Bld: 6 % — ABNORMAL HIGH (ref <5.7)
MEAN PLASMA GLUCOSE: 126 mg/dL — AB (ref <117)

## 2013-09-25 LAB — ETHANOL: Alcohol, Ethyl (B): <11 mg/dL (ref 0–11)

## 2013-09-25 MED ORDER — LOSARTAN POTASSIUM 25 MG PO TABS
25.0000 mg | ORAL_TABLET | Freq: Every day | ORAL | Status: DC
  Administered 2013-09-25: 25 mg via ORAL
  Filled 2013-09-25 (×2): qty 1

## 2013-09-25 MED ORDER — METOPROLOL TARTRATE 50 MG PO TABS
50.0000 mg | ORAL_TABLET | Freq: Two times a day (BID) | ORAL | Status: DC
  Administered 2013-09-25 – 2013-09-27 (×4): 50 mg via ORAL
  Filled 2013-09-25 (×5): qty 1

## 2013-09-25 MED ORDER — FENTANYL CITRATE 0.05 MG/ML IJ SOLN
50.0000 ug | Freq: Once | INTRAMUSCULAR | Status: AC
  Administered 2013-09-25: 50 ug via INTRAVENOUS
  Filled 2013-09-25: qty 2

## 2013-09-25 MED ORDER — ACETAMINOPHEN 10 MG/ML IV SOLN
1000.0000 mg | Freq: Four times a day (QID) | INTRAVENOUS | Status: AC
  Administered 2013-09-25 (×4): 1000 mg via INTRAVENOUS
  Filled 2013-09-25 (×4): qty 100

## 2013-09-25 MED ORDER — SENNOSIDES-DOCUSATE SODIUM 8.6-50 MG PO TABS
1.0000 | ORAL_TABLET | Freq: Two times a day (BID) | ORAL | Status: DC
  Administered 2013-09-25 – 2013-09-30 (×11): 1 via ORAL
  Filled 2013-09-25 (×12): qty 1

## 2013-09-25 MED ORDER — LEVOTHYROXINE SODIUM 50 MCG PO TABS
175.0000 ug | ORAL_TABLET | Freq: Every day | ORAL | Status: DC
  Administered 2013-09-25 – 2013-09-30 (×6): 175 ug via ORAL
  Filled 2013-09-25 (×9): qty 1

## 2013-09-25 MED ORDER — FUROSEMIDE 40 MG PO TABS
40.0000 mg | ORAL_TABLET | Freq: Every day | ORAL | Status: DC
  Administered 2013-09-25 – 2013-09-30 (×6): 40 mg via ORAL
  Filled 2013-09-25 (×6): qty 1

## 2013-09-25 MED ORDER — ACETAMINOPHEN 325 MG PO TABS
650.0000 mg | ORAL_TABLET | ORAL | Status: DC | PRN
  Administered 2013-09-26 – 2013-09-27 (×4): 650 mg via ORAL
  Filled 2013-09-25 (×4): qty 2

## 2013-09-25 MED ORDER — STROKE: EARLY STAGES OF RECOVERY BOOK
Freq: Once | Status: DC
  Filled 2013-09-25: qty 1

## 2013-09-25 MED ORDER — LABETALOL HCL 5 MG/ML IV SOLN
10.0000 mg | INTRAVENOUS | Status: DC | PRN
  Administered 2013-09-25: 40 mg via INTRAVENOUS
  Administered 2013-09-25: 10 mg via INTRAVENOUS
  Administered 2013-09-25 – 2013-09-26 (×2): 20 mg via INTRAVENOUS
  Administered 2013-09-28 (×2): 10 mg via INTRAVENOUS
  Filled 2013-09-25: qty 4
  Filled 2013-09-25: qty 8
  Filled 2013-09-25 (×4): qty 4

## 2013-09-25 MED ORDER — RESOURCE THICKENUP CLEAR PO POWD
ORAL | Status: DC | PRN
  Filled 2013-09-25: qty 125

## 2013-09-25 MED ORDER — ISOSORBIDE MONONITRATE ER 30 MG PO TB24
60.0000 mg | ORAL_TABLET | Freq: Every day | ORAL | Status: DC
  Administered 2013-09-25 – 2013-09-30 (×6): 60 mg via ORAL
  Filled 2013-09-25 (×4): qty 1
  Filled 2013-09-25: qty 2
  Filled 2013-09-25: qty 1

## 2013-09-25 MED ORDER — ACETAMINOPHEN 650 MG RE SUPP
650.0000 mg | RECTAL | Status: DC | PRN
  Administered 2013-09-25: 650 mg via RECTAL
  Filled 2013-09-25: qty 1

## 2013-09-25 MED ORDER — ENSURE COMPLETE PO LIQD
237.0000 mL | Freq: Two times a day (BID) | ORAL | Status: DC
  Administered 2013-09-25 – 2013-09-30 (×9): 237 mL via ORAL

## 2013-09-25 MED ORDER — ONDANSETRON HCL 4 MG/2ML IJ SOLN
4.0000 mg | Freq: Four times a day (QID) | INTRAMUSCULAR | Status: DC | PRN
  Administered 2013-09-25 – 2013-09-30 (×9): 4 mg via INTRAVENOUS
  Filled 2013-09-25 (×9): qty 2

## 2013-09-25 MED ORDER — PANTOPRAZOLE SODIUM 40 MG IV SOLR
40.0000 mg | Freq: Every day | INTRAVENOUS | Status: DC
  Administered 2013-09-25: 40 mg via INTRAVENOUS
  Filled 2013-09-25 (×3): qty 40

## 2013-09-25 NOTE — Progress Notes (Signed)
INITIAL NUTRITION ASSESSMENT  DOCUMENTATION CODES Per approved criteria  -Not Applicable   INTERVENTION: - Magic cup TID with meals, each supplement provides 290 kcal and 9 grams of protein - Ensure Complete po BID, each supplement provides 350 kcal and 13 grams of protein, please ensure that supplements are thickened adequately.  NUTRITION DIAGNOSIS: Inadequate oral intake related to nausea/vomiting and headache as evidenced by reported intake less than estimated needs.   Goal: Pt to meet >/= 90% of their estimated nutrition needs   Monitor:  Weight trend, po intake, acceptance of supplements, labs  Reason for Assessment: MST  79 y.o. male  Admitting Dx: <principal problem not specified>  ASSESSMENT: 78 y.o. male with a history of cardiac arrest in July who presents with headache that started earlier tonight. He states that it started around 9:30 pm and has been getting worse. In the ED, he was noted to have right sided weakness as well.  - Per pt's family, pt has lost ~15 lbs. She says that he was eating well all last week. She also says that he likes [employer].  - Pt seen by SLP who recommended a Dysphagia II diet with nectar-thickened liquids.  - Nutrition-focused physical exam reveals moderate fat and muscle wasting of the clavicle and acromion.   Labs: Na low K WNL CBGs: 101-170  Height: Ht Readings from Last 1 Encounters:  09/25/13 6' (1.829 m)    Weight: Wt Readings from Last 1 Encounters:  09/25/13 175 lb (79.379 kg)    Ideal Body Weight: 77.6 kg  % Ideal Body Weight: 98%  Wt Readings from Last 10 Encounters:  09/25/13 175 lb (79.379 kg)  09/19/13 174 lb (78.926 kg)  09/10/13 177 lb 7.5 oz (80.5 kg)  07/16/13 185 lb (83.915 kg)  05/13/13 191 lb (86.637 kg)  12/10/12 193 lb (87.544 kg)  05/27/12 201 lb (91.173 kg)  04/05/12 207 lb (93.895 kg)    Usual Body Weight: 190 lbs, in April 2015  % Usual Body Weight: 92%  BMI:  Body mass index is 23.73  kg/(m^2).  Estimated Nutritional Needs: Kcal: 2150-2350 Protein: 115-130 g Fluid: 2.3 L/day  Skin: Intact  Diet Order: NPO  EDUCATION NEEDS: -Education needs addressed   Intake/Output Summary (Last 24 hours) at 09/25/13 1050 Last data filed at 09/25/13 0800  Gross per 24 hour  Intake    100 ml  Output   1230 ml  Net  -1130 ml    Last BM: prior to admission   Labs:   Recent Labs Lab 09/19/13 1524 09/25/13 0045 09/25/13 0051  NA 135 133* 132*  K 4.6 4.1 4.0  CL 97 96 101  CO2 27 24  --   BUN 14 18 18   CREATININE 1.12 1.08 1.20  CALCIUM 8.4 9.1  --   GLUCOSE 128* 182* 189*    CBG (last 3)   Recent Labs  09/25/13 0039  GLUCAP 170*    Scheduled Meds: .  stroke: mapping our early stages of recovery book   Does not apply Once  . acetaminophen  1,000 mg Intravenous 4 times per day  . levothyroxine  175 mcg Oral QAC breakfast  . pantoprazole (PROTONIX) IV  40 mg Intravenous QHS  . senna-docusate  1 tablet Oral BID    Continuous Infusions:   Past Medical History  Diagnosis Date  . Lumbar stenosis     L5  . Hypothyroidism   . Hyperlipidemia   . Hypertension   . ED (erectile dysfunction)   .  Internal carotid artery stenosis 5/15    bilateral, 40-59%  . Myocardial infarction 08/2013  . Heart murmur   . Stroke ~ 2011    "slight; no permanent damage"    Past Surgical History  Procedure Laterality Date  . Tonsillectomy and adenoidectomy    . Appendectomy    . Coronary angioplasty with stent placement  08/2013    "2"  . Cataract extraction, bilateral Bilateral ~ 2012    Terrace Arabia RD, LDN

## 2013-09-25 NOTE — Progress Notes (Signed)
Patient ID: Taylor Keller, male   DOB: [birth year], 78 y.o.   MRN: [id number] Pt's CT head reviewed. Deep R cerebellar hemorrhage noted with intraventricular extension but no hydrocephalus. Given his neuro exam (awake, interactive), his recent MI/ cardiac arrest, and the smaller size of the hemorrhage without hydro, I would not recommend acute surgical intervention. Risks likely outweigh benefits at present. Would repeat CT in 12-24 hrs or if change in status since he's at risk of hydro and/or enlargement of hemmorrhage. Hold anti-plt agents. I will follow.

## 2013-09-25 NOTE — H&P (Addendum)
Neurology H&P  CC: headache  History is obtained from:patient, wife  HPI: Taylor Keller is a 78 y.o. male with a history of cardiac arrest in July who presents with headache that started earlier tonight. He states that it started around 9:30 pm and has been getting worse. In the ED, he was noted to have right sided weakness as well.   Of note, he had a vfib arrest in July following MI and has been recovering from this. He had two DES placed at that time to the LAD and left circumflex arteries.   LKW:  9:30 pm tpa given?: no, hemorrhage.     ROS: A 14 point ROS was performed and is negative except as noted in the HPI.   Past Medical History  Diagnosis Date  . Lumbar stenosis     L5  . Hypothyroidism   . Hyperlipidemia   . Hypertension   . ED (erectile dysfunction)   . Internal carotid artery stenosis 5/15    bilateral, 40-59%  . Myocardial infarction 08/2013  . Keller murmur   . Stroke ~ 2011    "slight; no permanent damage"    Family History: Mother - stroke  Social History: Tob: denies  Exam: Current vital signs: BP 153/71  Pulse 75  Temp(Src) 98.2 F (36.8 C) (Oral)  Resp 23  Ht 6' (1.829 m)  Wt 79.379 kg (175 lb)  BMI 23.73 kg/m2  SpO2 95% Vital signs in last 24 hours: Temp:  [98.2 F (36.8 C)] 98.2 F (36.8 C) (08/20 0032) Pulse Rate:  [75] 75 (08/20 0032) Resp:  [23] 23 (08/20 0032) BP: (153)/(71) 153/71 mmHg (08/20 0032) SpO2:  [95 %] 95 % (08/20 0032) Weight:  [79.379 kg (175 lb)] 79.379 kg (175 lb) (08/20 0032)  General: in bed, NAD CV: RRR Mental Status: Patient is awake, alert, oriented to person, place, month, year, and situation. Immediate and remote memory are intact. Patient is able to give a clear and coherent history. No signs of aphasia or neglect Cranial Nerves: II: Visual Fields are full. Pupils are equal, round, and reactive to light.  Discs are difficult to visualize. III,IV, VI: EOMI without ptosis or diploplia. He has  nystagmus in all directions of gaze.  V: Facial sensation is symmetric to temperature VII: Facial movement is symmetric.  VIII: hearing is intact to voice X: Uvula elevates symmetrically XI: Shoulder shrug is symmetric. XII: tongue is midline without atrophy or fasciculations.  Motor: Tone is normal. Bulk is normal. 5/5 strength was present in the left and also right leg. He has 4/5 weakness of the right arm.  Sensory: Sensation is symmetric to light touch and temperature in the arms and legs. Deep Tendon Reflexes: 2+ and symmetric in the biceps and patellae.  Cerebellar: FNF and HKS are intact on the left, marked ataxia on the right.  Gait: Not tested due to patient safety concerns.     I have reviewed labs in epic and the results pertinent to this consultation are: Chem 8 - mildly low sodium.  INR - wnl at 1.38  I have reviewed the images obtained:CT head- right cerebellar hemorrhage.   Impression: 78 yo M with cerebellar hemorrhage. His case is complicated by the presence of recently placed drug eluting stents and he is at risk for thrombosis given that we will be holding his antiplatelets.   Recommendations: 1) Admit to ICU 2) no antiplatelets or anticoagulants 3) blood pressure control with goal systolic < 458, will use labetalol  prn 4) Frequent neuro checks 5) If symptoms worsen or there is decreased mental status, repeat stat head CT 6) PT,OT,ST 7) Neurosurgical consultation   This patient is critically ill and at significant risk of neurological worsening, death and care requires constant monitoring of vital signs, hemodynamics,respiratory and cardiac monitoring, neurological assessment, discussion with family, other specialists and medical decision making of high complexity. I spent 60 minutes of neurocritical care time  in the care of  this patient.  Roland Rack, MD [facility] [phone]  If 7pm- 7am, please page neurology on call as listed  in [place]. 09/25/2013  1:19 AM

## 2013-09-25 NOTE — Consult Note (Signed)
Reason for Consult: Cerebellar hematoma Referring Physician: EDP  Taylor Keller is an 78 y.o. male.   HPI:  78 year old white male who had an MI and ventricular fibrillation just last month and was put on Plavix who presented yesterday with acute headache and nausea and vomiting. Head CT showed a right-sided cerebellar hemorrhage with intraventricular extension. He was admitted to the neurology service. He complains of significant headache right now. There has been no more emesis. The nurses report that there has been some lethargy.  Past Medical History  Diagnosis Date  . Lumbar stenosis     L5  . Hypothyroidism   . Hyperlipidemia   . Hypertension   . ED (erectile dysfunction)   . Internal carotid artery stenosis 5/15    bilateral, 40-59%  . Myocardial infarction 08/2013  . Heart murmur   . Stroke ~ 2011    "slight; no permanent damage"    Past Surgical History  Procedure Laterality Date  . Tonsillectomy and adenoidectomy    . Appendectomy    . Coronary angioplasty with stent placement  08/2013    "2"  . Cataract extraction, bilateral Bilateral ~ 2012    Allergies  Allergen Reactions  . Benazepril Cough    History  Substance Use Topics  . Smoking status: Never Smoker   . Smokeless tobacco: Never Used  . Alcohol Use: Yes     Comment: 09/08/2013 "couple glasses of wine once/month"    Family History  Problem Relation Age of Onset  . Cancer Sister     colon     Review of Systems  Positive ROS: Unable to obtain  All other systems have been reviewed and were otherwise negative with the exception of those mentioned in the HPI and as above.  Objective: Vital signs in last 24 hours: Temp:  [97.5 F (36.4 C)-98.2 F (36.8 C)] 97.5 F (36.4 C) (08/20 0400) Pulse Rate:  [73-76] 75 (08/20 0530) Resp:  [19-29] 19 (08/20 0530) BP: (153-180)/(67-81) 155/69 mmHg (08/20 0530) SpO2:  [95 %-99 %] 99 % (08/20 0530) Weight:  [79.379 kg (175 lb)] 79.379 kg (175 lb) (08/20  0032)  General Appearance: Lethargic but easily arousable and keeps eyes open to regard examiner, cooperative, no distress, appears stated age Head: Normocephalic, without obvious abnormality, atraumatic Eyes: PERRL, conjunctiva/corneas clear, EOM'Keller intact without obvious nystagmus     Neck: Supple, symmetrical, trachea midline Lungs:  respirations unlabored Heart: Regular rate and rhythm   NEUROLOGIC:   Mental status: Lethargic but arousable, no aphasia, decreased attention span, Memory and fund of knowledge unable to be adequately tested, some mild dysarthria Motor Exam - grossly normal, normal tone and bulk Sensory Exam - grossly normal Coordination - grossly normal Gait - not tested Balance - not tested Cranial Nerves: I: smell Not tested  II: visual acuity  OS: na    OD: na  II: visual fields Full to confrontation  II: pupils Equal, round, reactive to light  III,VII: ptosis None  III,IV,VI: extraocular muscles  Full ROM  V: mastication Normal  V: facial light touch sensation  Normal  V,VII: corneal reflex  Present  VII: facial muscle function - upper  Normal  VII: facial muscle function - lower Normal  VIII: hearing Not tested  IX: soft palate elevation  Normal  IX,X: gag reflex Present  XI: trapezius strength  5/5  XI: sternocleidomastoid strength 5/5  XI: neck flexion strength  5/5  XII: tongue strength  Normal    Data Review Lab  Results  Component Value Date   WBC 8.6 09/25/2013   HGB 10.2* 09/25/2013   HCT 30.0* 09/25/2013   MCV 87.9 09/25/2013   PLT 297 09/25/2013   Lab Results  Component Value Date   NA 132* 09/25/2013   K 4.0 09/25/2013   CL 101 09/25/2013   CO2 24 09/25/2013   BUN 18 09/25/2013   CREATININE 1.20 09/25/2013   GLUCOSE 189* 09/25/2013   Lab Results  Component Value Date   INR 1.38 09/25/2013    Radiology: Ct Head Wo Contrast  09/25/2013   CLINICAL DATA:  Headache and dizziness  EXAM: CT HEAD WITHOUT CONTRAST  TECHNIQUE: Contiguous axial  images were obtained from the base of the skull through the vertex without intravenous contrast.  COMPARISON:  08/17/2008  FINDINGS: Skull and Sinuses:Negative for fracture or destructive process. The mastoids, middle ears, and imaged paranasal sinuses are clear.  Orbits: Bilateral cataract resection.  Brain: There is a parenchymal hematoma within the inferior right cerebellum including the right cerebellar tonsil. Maximal diameter 2.6 cm. Intraventricular extension at the level of the fourth ventricle, extending through the cerebral aqueduct into the lateral and third ventricles. There is extension into the foramen the Luschka on the left with CSF haziness at the foramen magnum likely admixed blood. There is mild lateral ventriculomegaly, but inproportion to atrophy. There is edema around the parenchymal hematoma. No discrete infarct.  Chronic small vessel disease with ischemic gliosis around the lateral ventricles.  Critical Value/emergent results were called by telephone at the time of interpretation on 09/25/2013 at 12:50 am to Dr. Julianne Keller , who verbally acknowledged these results.  IMPRESSION: 2.6 x 1.9 cm hematoma in the inferior right cerebellum. The hemorrhage could be hypertensive or from infarct with hemorrhagic conversion. Subarachnoid and intraventricular extension of hemorrhage, as above.   Electronically Signed   By: Jorje Guild M.D.   On: 09/25/2013 00:55     Assessment/Plan: 78 year old male with a recent MI who was on Plavix who is admitted with headache and nausea and vomiting. CT scan shows a small to moderate right deep cerebellar hemorrhage with interventricular extension but no hydrocephalus at this point. We'll have to watch for the development of hydrocephalus or for enlargement of hematoma. We will follow his clinical exam in the ICU, and he'll have to have a followup head CT some time today. He is lethargic but arousable and is controlling his airway. At this point I'm not  sure that craniectomy for evacuation of the hematoma and/or placement of a ventriculostomy catheter doesn't carry more risk than potential benefit. Obviously this can change over time, but for now I would recommend no surgical intervention.   Taylor Keller 09/25/2013 5:59 AM

## 2013-09-25 NOTE — Plan of Care (Signed)
Problem: Acute Treatment Outcomes Goal: Pain controlled Outcome: Completed/Met Date Met:  09/25/13 Given IV acetaminophen q 6 hours.  Problem: Progression Outcomes Goal: Pain controlled Outcome: Completed/Met Date Met:  09/25/13 Slight headache.  Controlling with IV Tylenol.

## 2013-09-25 NOTE — Progress Notes (Signed)
STROKE TEAM PROGRESS NOTE   HISTORY Taylor Keller is a 78 y.o. male with a history of cardiac arrest in July who presents with headache that started earlier tonight. He states that it started around 9:30 pm 09/24/2013 and has been getting worse. In the ED, he was noted to have right sided weakness as well.  Of note, he had a vfib arrest in July following MI and has been recovering from this. He had two DES placed at that time to the LAD and left circumflex arteries.  Patient was not administered TPA secondary to hemorrhage. He was admitted to the neuro ICU for further evaluation and treatment.   SUBJECTIVE (INTERVAL HISTORY) His wife is at the bedside.  Overall he feels his condition is unchanged. He still awake alert following commands. Had MRI and MRA in am. Denies headache or N/V.   OBJECTIVE Temp:  [97.5 F (36.4 C)-98.2 F (36.8 C)] 97.6 F (36.4 C) (08/20 0800) Pulse Rate:  [73-77] 73 (08/20 0800) Resp:  [10-29] 18 (08/20 0800) BP: (149-180)/(61-81) 149/67 mmHg (08/20 0800) SpO2:  [95 %-99 %] 98 % (08/20 0800) Weight:  [79.379 kg (175 lb)] 79.379 kg (175 lb) (08/20 0032)  BMET    Component Value Date/Time   NA 132* 09/25/2013 0051   K 4.0 09/25/2013 0051   CL 101 09/25/2013 0051   CO2 24 09/25/2013 0045   GLUCOSE 189* 09/25/2013 0051   BUN 18 09/25/2013 0051   CREATININE 1.20 09/25/2013 0051   CREATININE 1.12 09/19/2013 1524   CALCIUM 9.1 09/25/2013 0045   GFRNONAA 65* 09/25/2013 0045   GFRNONAA 63 09/19/2013 1524   GFRAA 75* 09/25/2013 0045   GFRAA 73 09/19/2013 1524   CBC    Component Value Date/Time   WBC 8.6 09/25/2013 0045   RBC 3.46* 09/25/2013 0045   HGB 10.2* 09/25/2013 0051   HCT 30.0* 09/25/2013 0051   PLT 297 09/25/2013 0045   MCV 87.9 09/25/2013 0045   MCH 26.9 09/25/2013 0045   MCHC 30.6 09/25/2013 0045   RDW 17.2* 09/25/2013 0045   LYMPHSABS 0.8 09/25/2013 0045   MONOABS 0.7 09/25/2013 0045   EOSABS 0.1 09/25/2013 0045   BASOSABS 0.0 09/25/2013 0045   Lipid Panel      Component Value Date/Time   CHOL 99 05/13/2013 0833   TRIG 48 05/13/2013 0833   HDL 38* 05/13/2013 0833   CHOLHDL 2.6 05/13/2013 0833   VLDL 10 05/13/2013 0833   LDLCALC 51 05/13/2013 0833   Drugs of Abuse     Component Value Date/Time   LABOPIA NONE DETECTED 09/25/2013 0312   LABOPIA NEGATIVE 08/18/2008 0400   COCAINSCRNUR NONE DETECTED 09/25/2013 0312   COCAINSCRNUR NEGATIVE 08/18/2008 0400   LABBENZ NONE DETECTED 09/25/2013 0312   LABBENZ NEGATIVE 08/18/2008 0400   AMPHETMU NONE DETECTED 09/25/2013 0312   AMPHETMU NEGATIVE 08/18/2008 0400   THCU NONE DETECTED 09/25/2013 0312   LABBARB NONE DETECTED 09/25/2013 0312     PHYSICAL EXAM Physical exam  Temp:  [97.5 F (36.4 C)-98.2 F (36.8 C)] 97.6 F (36.4 C) (08/20 0800) Pulse Rate:  [70-77] 70 (08/20 1100) Resp:  [10-29] 17 (08/20 1100) BP: (149-180)/(61-81) 150/71 mmHg (08/20 1130) SpO2:  [95 %-100 %] 98 % (08/20 1130) Weight:  [175 lb (79.379 kg)] 175 lb (79.379 kg) (08/20 0032)  General - Well nourished, well developed, in no apparent distress.  Ophthalmologic - not able to see through.  Cardiovascular - Regular rate and rhythm with no murmur.  Mental Status -  Level of arousal and orientation to time, place, and person were intact. Language including expression, naming, repetition, comprehension was assessed and found intact.  Cranial Nerves II - XII - II - Visual field intact OU. III, IV, VI - Extraocular movements intact, but had left eye gaze preference, horizontal nystagumus on right gaze. V - Facial sensation intact bilaterally. VII - Facial movement intact bilaterally. VIII - Hearing & vestibular intact bilaterally. X - Palate elevates symmetrically. XI - Chin turning & shoulder shrug intact bilaterally. XII - Tongue protrusion intact.  Motor Strength - The patient's strength was normal in all extremities and pronator drift was absent.  Bulk was normal and fasciculations were absent.   Motor Tone - Muscle tone was  assessed at the neck and appendages and was normal.  Reflexes - The patient's reflexes were normal in all extremities and he had no pathological reflexes.  Sensory - Light touch, temperature/pinprick were assessed and were normal.    Coordination - The patient had dysmetria on the right hand and foot, no obvious ataxia.  Tremor was absent.  Gait and Station - not tested due to BP concern.   ASSESSMENT/PLAN  Mr. Taylor Keller is a 78 y.o. male with hx of vfib cardiac arrest, MI with stent placement in July of this year presenting with headache and right sided weakness.  Imaging confirms a right cerebellar hemorrhage infarct. Volume:  15 cc. Stroke work up underway. Antiplatelet discontinued. Will have cardiology consult. NSG on board. MRI showed slightly enlargement of hematoma but MRI tends to over estimate bleeding comparing with CT. No significant hydrocephalus comparing with CT overnight. He is neurologically intact, will not consider surgical intervention at this moment. NSG is following.   Stroke:  right cerebellar hemorrhage secondary to hypertension likely     Continue ICU level care   Keep in bed rest for x 24h  MRI brain showed slightly enlargement of hematoma but MRI tends to over estimate bleeding comparing with CT. No significant hydrocephalus comparing with CT overnight.  aspirin 81 mg orally every day and clopidogrel 75 mg orally every day prior to admission due to recent stent, now off due to hemorrhage.  Agree with cardiology, would consider to resume plavix alone in about 2 weeks if bleeding is stable  Would hold off lipitor for now since it is debatable whether statin may worsening cerebral bleeding. Would consider to resume lipitor in about 5-7 days.  Cardiology input appreciated.  NSG is on board and following. Currently no surgical intervention. Will close monitoring. NSG help is appreciated.  2D Echo  In July without source of embolus   Carotid bilateral  40-59% stenosis, mild mixed plaques recently.   Check HgbA1c in am  SCDs for VTE prophylaxis  Dysphagia diet  Therapy needs:  evals pending tomorrow  Risk factor management/education  Hypertension, accelerated   BP 180/81 on admission  Home meds:  Cozaar, lopressor, imdur, lasix.   Restart home meds for BP control.  BP 149-180/61-81 past 24h  SBP goal < 160  Hyperlipidemia  LDL 51   Patient on lipitor 80 daily at home, not yet resumed in hospital   Would hold off lipitor for now since it is debatable whether statin may worsening cerebral bleeding. Would consider to resume lipitor in about 5-7 days.  Other Stroke Risk Factors   ETOH use    Hx stroke/TIA 2011   coronary artery disease with cardiac arrest, MI s/p stent placement x 2 08/2013. Placed on ASA and  plavix at that time. Now off due to hemorrhage. Check troponins. Cardiology consult appreciated.  Other Pertinent History  Hx CHF post MI - resume lasix and [facility] day # 0  This patient is critically ill due to posterior circulation bleeding, MI s/p stent but off antiplatelet now, and at significant risk of neurological worsening, death form hematoma expansion, hydrocephalus, brain edema and cerebral herniation, as well as recurrent MI and cardiac arrest. This patient's care requires constant monitoring of vital signs, hemodynamics, respiratory and cardiac monitoring, review of multiple databases, neurological assessment, discussion with family, other specialists and medical decision making of high complexity. I spent 45 minutes of neurocritical care time in the care of this patient.   Rosalin Hawking, MD PhD Stroke Neurology 09/25/2013 1:37 PM  To contact Stroke Continuity provider, please refer to [email or web address] After hours, contact General Neurology

## 2013-09-25 NOTE — Evaluation (Signed)
Speech Language Pathology Evaluation Patient Details Name: Taylor Keller MRN: [id number] DOB: May 31, 1935 Today's Date: 09/25/2013 Time: 0910-0950 SLP Time Calculation (min): 40 min  Problem List:  Patient Active Problem List   Diagnosis Date Noted  . ICH (intracerebral hemorrhage) 09/25/2013  . Chest pain at rest 09/08/2013  . H/O cardiac arrest July 2015 09/08/2013  . CAD- s/p LAD and CFX stent July 2015 09/08/2013  . Acute CHF 09/08/2013  . Internal carotid artery stenosis   . Hypothyroidism   . Hyperlipidemia   . Hypertension   . ED (erectile dysfunction)    Past Medical History:  Past Medical History  Diagnosis Date  . Lumbar stenosis     L5  . Hypothyroidism   . Hyperlipidemia   . Hypertension   . ED (erectile dysfunction)   . Internal carotid artery stenosis 5/15    bilateral, 40-59%  . Myocardial infarction 08/2013  . Heart murmur   . Stroke ~ 2011    "slight; no permanent damage"   Past Surgical History:  Past Surgical History  Procedure Laterality Date  . Tonsillectomy and adenoidectomy    . Appendectomy    . Coronary angioplasty with stent placement  08/2013    "2"  . Cataract extraction, bilateral Bilateral ~ 2012   HPI:  78 yo M with cerebellar hemorrhage. MRI shows 2.6 x 1.9 cm hematoma in the inferior right cerebellum. Pt also has a history of recent MI and cardiac arrest, intubated 7 days, who was discharged from [facility] general hospital on 7/31,  Pt was d/c'd home on Honey thick liquids. Readmitted to [facility] with CHF.  Found to have dysphagia with silent aspiration s/p extubation.but underwent an MBS on 8/3 showing mild sensori-motor pharyngeal dysphagia, characterized by decreased base of tongue contraction to the pharyngeal wall and decreased laryngeal elevation and epiglottic deflection. Pt recommended to consume a dys 3 diet with thin liquids with a chin tuck and second swallow to clear residue.   Assessment / Plan / Recommendation Clinical  Impression  Pt demonstrates mild cognitive impairement following cerebellar CVA chacterized by reduced selective attention in distracting environment. New physical and visual impairments will require increased awareness and problem solving skills to compensate for deficits. Pts cognitive function impacted by pain and lethargy, unfamiliar setting. Expect pt to make excellent gains in higher level cognitive function with therapeutic opportunity. Will follow for further treatment and recommend CIR at next level of care.     SLP Assessment  Patient needs continued Speech Lanaguage Pathology Services    Follow Up Recommendations  Inpatient Rehab    Frequency and Duration min 2x/week  2 weeks   Pertinent Vitals/Pain Pain Assessment: Faces Faces Pain Scale: Hurts little more   SLP Goals  SLP Goals Potential to Achieve Goals: Good  SLP Evaluation Prior Functioning  Cognitive/Linguistic Baseline: Baseline deficits Baseline deficit details: mild memory deficits per pt Type of Home: House  Lives With: Spouse Available Help at Discharge: Family;Available 24 hours/day Vocation: Retired Sports coach)   Cognition  Overall Cognitive Status: Impaired/Different from baseline Arousal/Alertness: Lethargic Orientation Level: Oriented to person;Oriented to place (fneeded cues for time and situation) Attention: Selective Selective Attention: Impaired Selective Attention Impairment: Verbal complex Memory: Impaired Memory Impairment: Decreased short term memory;Decreased recall of new information (much improved with verbal cues and repetition) Decreased Short Term Memory: Verbal complex Awareness: Impaired Awareness Impairment: Intellectual impairment;Emergent impairment (pt developing intellectual and emergent awareness) Problem Solving:  (emerging problem solving skills regarding new impairment) Executive Function: Reasoning;Initiating;Self Monitoring;Self  Correcting Reasoning: Appears  intact Initiating: Appears intact Self Monitoring: Appears intact Self Correcting: Appears intact Safety/Judgment: Appears intact    Comprehension  Auditory Comprehension Overall Auditory Comprehension: Appears within functional limits for tasks assessed (as long as environment was not too distracting) Commands: Impaired One Step Basic Commands: 75-100% accurate Multistep Basic Commands: 50-74% accurate Complex Commands: 75-100% accurate Reading Comprehension Reading Status: Not tested (expect impairment d/t visual deficits)    Expression Verbal Expression Overall Verbal Expression: Appears within functional limits for tasks assessed Written Expression Dominant Hand: Right   Oral / Motor Oral Motor/Sensory Function Overall Oral Motor/Sensory Function: Appears within functional limits for tasks assessed Motor Speech Overall Motor Speech: Appears within functional limits for tasks assessed   GO    Taylor [place], MA CCC-SLP [phone]  Lynann Beaver 09/25/2013, 4:37 PM

## 2013-09-25 NOTE — Consult Note (Signed)
Primary Physician:  Jenna Luo Primary Cardiologist:  Meda Coffee  HPI: Patient is a 78 yo who we are asked to see for continued cardiac care.  Admitted yesterday for  Patient has history of MI with cardiac arrest July 16  Discharged from [facility].  Underetn cath 7/20 with DES to LAD and DES to LCx.  Treated with ASA and Plavix.   He was admitted to [facility] 8/3 with increased SOB, orthopnea and R sided CP   MRI negative for pericarditis.  Echo showed LVEF was normal.  Patient diuresed approximately 5L.  Discharged home on 8/5.  SInce d/c wife says he had been doing well  Was about to start rehab.   Presented to ER last night with HA, dizziness, nausea and R facial droop.  Code stroke called.  CT showed R cerebellar hemorrhage with interventricular extension.  Neurosurgery consult recomm following with repeat CT in 12-24 hours.       Past Medical History  Diagnosis Date  . Lumbar stenosis     L5  . Hypothyroidism   . Hyperlipidemia   . Hypertension   . ED (erectile dysfunction)   . Internal carotid artery stenosis 5/15    bilateral, 40-59%  . Myocardial infarction 08/2013  . Heart murmur   . Stroke ~ 2011    "slight; no permanent damage"    Medications Prior to Admission  Medication Sig Dispense Refill  . acetaminophen (TYLENOL) 325 MG tablet Take 2 tablets (650 mg total) by mouth every 4 (four) hours as needed for headache or mild pain.      Marland Kitchen albuterol (PROVENTIL) (5 MG/ML) 0.5% nebulizer solution Take 2.5 mg by nebulization every 6 (six) hours as needed for wheezing or shortness of breath.      Marland Kitchen aspirin 81 MG chewable tablet Chew 81 mg by mouth daily.      Marland Kitchen atorvastatin (LIPITOR) 80 MG tablet Take 80 mg by mouth every evening.      . clopidogrel (PLAVIX) 75 MG tablet Take 75 mg by mouth daily.      . diphenhydrAMINE (BENADRYL) 25 MG tablet Take 25 mg by mouth every 6 (six) hours as needed.      . furosemide (LASIX) 40 MG tablet Take 1 tablet (40 mg total) by  mouth daily.  30 tablet  11  . ipratropium (ATROVENT) 0.02 % nebulizer solution Take 0.5 mg by nebulization every 6 (six) hours as needed for wheezing or shortness of breath.      . isosorbide mononitrate (IMDUR) 60 MG 24 hr tablet Take 60 mg by mouth daily.      . lansoprazole (PREVACID SOLUTAB) 30 MG disintegrating tablet Take 30 mg by mouth daily.      Marland Kitchen levothyroxine (SYNTHROID, LEVOTHROID) 175 MCG tablet Take 175 mcg by mouth daily.      Marland Kitchen losartan (COZAAR) 25 MG tablet Take 1 tablet (25 mg total) by mouth daily.  30 tablet  11  . metoprolol (LOPRESSOR) 50 MG tablet Take 50 mg by mouth 2 (two) times daily.      . nitroGLYCERIN (NITROSTAT) 0.4 MG SL tablet Place 1 tablet (0.4 mg total) under the tongue every 5 (five) minutes as needed for chest pain.  25 tablet  prn  . zolpidem (AMBIEN) 5 MG tablet Take 1 tablet (5 mg total) by mouth at bedtime as needed for sleep.  30 tablet  1     .  stroke: mapping our early stages of recovery book  Does not apply Once  . acetaminophen  1,000 mg Intravenous 4 times per day  . levothyroxine  175 mcg Oral QAC breakfast  . pantoprazole (PROTONIX) IV  40 mg Intravenous QHS  . senna-docusate  1 tablet Oral BID    Infusions:    Allergies  Allergen Reactions  . Benazepril Cough    History   Social History  . Marital Status: Married    Spouse Name: N/A    Number of Children: N/A  . Years of Education: N/A   Occupational History  . Not on file.   Social History Main Topics  . Smoking status: Never Smoker   . Smokeless tobacco: Never Used  . Alcohol Use: Yes     Comment: 09/08/2013 "couple glasses of wine once/month"  . Drug Use: No  . Sexual Activity: Yes   Other Topics Concern  . Not on file   Social History Narrative  . No narrative on file    Family History  Problem Relation Age of Onset  . Cancer Sister     colon    REVIEW OF SYSTEMS:  All systems reviewed  Negative to the above problem except as noted above.    PHYSICAL  EXAM: Filed Vitals:   09/25/13 0900  BP: 152/64  Pulse: 71  Temp:   Resp: 12     Intake/Output Summary (Last 24 hours) at 09/25/13 0958 Last data filed at 09/25/13 0800  Gross per 24 hour  Intake    100 ml  Output   1230 ml  Net  -1130 ml    General:  Patient comfortable No respiratory difficulty HEENT: normal Neck: supple. no JVD. Carotids 2+ bilat; no bruits. No lymphadenopathy or thryomegaly appreciated. Cor: PMI nondisplaced. Regular rate & rhythm. No rubs, gallops or murmurs. Lungs: clear Abdomen: soft, nontender, nondistended. No hepatosplenomegaly. No bruits or masses. Good bowel sounds. Extremities: no cyanosis, clubbing, rash, edema Neuro:Deferred  ECG:  Results for orders placed during the hospital encounter of 09/25/13 (from the past 24 hour(s))  CBG MONITORING, ED     Status: Abnormal   Collection Time    09/25/13 12:39 AM      Result Value Ref Range   Glucose-Capillary 170 (*) 70 - 99 mg/dL   Comment 1 Documented in Chart     Comment 2 Notify RN    ETHANOL     Status: None   Collection Time    09/25/13 12:45 AM      Result Value Ref Range   Alcohol, Ethyl (B) <11  0 - 11 mg/dL  PROTIME-INR     Status: Abnormal   Collection Time    09/25/13 12:45 AM      Result Value Ref Range   Prothrombin Time 17.0 (*) 11.6 - 15.2 seconds   INR 1.38  0.00 - 1.49  APTT     Status: None   Collection Time    09/25/13 12:45 AM      Result Value Ref Range   aPTT 37  24 - 37 seconds  CBC     Status: Abnormal   Collection Time    09/25/13 12:45 AM      Result Value Ref Range   WBC 8.6  4.0 - 10.5 K/uL   RBC 3.46 (*) 4.22 - 5.81 MIL/uL   Hemoglobin 9.3 (*) 13.0 - 17.0 g/dL   HCT 30.4 (*) 39.0 - 52.0 %   MCV 87.9  78.0 - 100.0 fL   MCH 26.9  26.0 -  34.0 pg   MCHC 30.6  30.0 - 36.0 g/dL   RDW 17.2 (*) 11.5 - 15.5 %   Platelets 297  150 - 400 K/uL  DIFFERENTIAL     Status: Abnormal   Collection Time    09/25/13 12:45 AM      Result Value Ref Range   Neutrophils  Relative % 82 (*) 43 - 77 %   Neutro Abs 7.0  1.7 - 7.7 K/uL   Lymphocytes Relative 9 (*) 12 - 46 %   Lymphs Abs 0.8  0.7 - 4.0 K/uL   Monocytes Relative 8  3 - 12 %   Monocytes Absolute 0.7  0.1 - 1.0 K/uL   Eosinophils Relative 1  0 - 5 %   Eosinophils Absolute 0.1  0.0 - 0.7 K/uL   Basophils Relative 0  0 - 1 %   Basophils Absolute 0.0  0.0 - 0.1 K/uL  COMPREHENSIVE METABOLIC PANEL     Status: Abnormal   Collection Time    09/25/13 12:45 AM      Result Value Ref Range   Sodium 133 (*) 137 - 147 mEq/L   Potassium 4.1  3.7 - 5.3 mEq/L   Chloride 96  96 - 112 mEq/L   CO2 24  19 - 32 mEq/L   Glucose, Bld 182 (*) 70 - 99 mg/dL   BUN 18  6 - 23 mg/dL   Creatinine, Ser 1.08  0.50 - 1.35 mg/dL   Calcium 9.1  8.4 - 10.5 mg/dL   Total Protein 7.8  6.0 - 8.3 g/dL   Albumin 2.7 (*) 3.5 - 5.2 g/dL   AST 17  0 - 37 U/L   ALT 20  0 - 53 U/L   Alkaline Phosphatase 80  39 - 117 U/L   Total Bilirubin 0.2 (*) 0.3 - 1.2 mg/dL   GFR calc non Af Amer 65 (*) >90 mL/min   GFR calc Af Amer 75 (*) >90 mL/min   Anion gap 13  5 - 15  I-STAT TROPOININ, ED     Status: None   Collection Time    09/25/13 12:49 AM      Result Value Ref Range   Troponin i, poc 0.03  0.00 - 0.08 ng/mL   Comment 3           I-STAT CHEM 8, ED     Status: Abnormal   Collection Time    09/25/13 12:51 AM      Result Value Ref Range   Sodium 132 (*) 137 - 147 mEq/L   Potassium 4.0  3.7 - 5.3 mEq/L   Chloride 101  96 - 112 mEq/L   BUN 18  6 - 23 mg/dL   Creatinine, Ser 1.20  0.50 - 1.35 mg/dL   Glucose, Bld 189 (*) 70 - 99 mg/dL   Calcium, Ion 1.12 (*) 1.13 - 1.30 mmol/L   TCO2 24  0 - 100 mmol/L   Hemoglobin 10.2 (*) 13.0 - 17.0 g/dL   HCT 30.0 (*) 39.0 - 52.0 %  I-STAT CG4 LACTIC ACID, ED     Status: None   Collection Time    09/25/13 12:52 AM      Result Value Ref Range   Lactic Acid, Venous 0.98  0.5 - 2.2 mmol/L  MRSA PCR SCREENING     Status: None   Collection Time    09/25/13  3:11 AM      Result Value Ref  Range   MRSA  by PCR NEGATIVE  NEGATIVE  URINE RAPID DRUG SCREEN (HOSP PERFORMED)     Status: None   Collection Time    09/25/13  3:12 AM      Result Value Ref Range   Opiates NONE DETECTED  NONE DETECTED   Cocaine NONE DETECTED  NONE DETECTED   Benzodiazepines NONE DETECTED  NONE DETECTED   Amphetamines NONE DETECTED  NONE DETECTED   Tetrahydrocannabinol NONE DETECTED  NONE DETECTED   Barbiturates NONE DETECTED  NONE DETECTED  URINALYSIS, ROUTINE W REFLEX MICROSCOPIC     Status: Abnormal   Collection Time    09/25/13  3:12 AM      Result Value Ref Range   Color, Urine YELLOW  YELLOW   APPearance CLEAR  CLEAR   Specific Gravity, Urine 1.018  1.005 - 1.030   pH 6.5  5.0 - 8.0   Glucose, UA NEGATIVE  NEGATIVE mg/dL   Hgb urine dipstick TRACE (*) NEGATIVE   Bilirubin Urine NEGATIVE  NEGATIVE   Ketones, ur NEGATIVE  NEGATIVE mg/dL   Protein, ur 30 (*) NEGATIVE mg/dL   Urobilinogen, UA 0.2  0.0 - 1.0 mg/dL   Nitrite NEGATIVE  NEGATIVE   Leukocytes, UA NEGATIVE  NEGATIVE  URINE MICROSCOPIC-ADD ON     Status: Abnormal   Collection Time    09/25/13  3:12 AM      Result Value Ref Range   Squamous Epithelial / LPF RARE  RARE   RBC / HPF 0-2  <3 RBC/hpf   Bacteria, UA RARE  RARE   Casts GRANULAR CAST (*) NEGATIVE   Ct Head Wo Contrast  09/25/2013   CLINICAL DATA:  Headache and dizziness  EXAM: CT HEAD WITHOUT CONTRAST  TECHNIQUE: Contiguous axial images were obtained from the base of the skull through the vertex without intravenous contrast.  COMPARISON:  08/17/2008  FINDINGS: Skull and Sinuses:Negative for fracture or destructive process. The mastoids, middle ears, and imaged paranasal sinuses are clear.  Orbits: Bilateral cataract resection.  Brain: There is a parenchymal hematoma within the inferior right cerebellum including the right cerebellar tonsil. Maximal diameter 2.6 cm. Intraventricular extension at the level of the fourth ventricle, extending through the cerebral aqueduct into  the lateral and third ventricles. There is extension into the foramen the Luschka on the left with CSF haziness at the foramen magnum likely admixed blood. There is mild lateral ventriculomegaly, but inproportion to atrophy. There is edema around the parenchymal hematoma. No discrete infarct.  Chronic small vessel disease with ischemic gliosis around the lateral ventricles.  Critical Value/emergent results were called by telephone at the time of interpretation on 09/25/2013 at 12:50 am to Dr. Julianne Rice , who verbally acknowledged these results.  IMPRESSION: 2.6 x 1.9 cm hematoma in the inferior right cerebellum. The hemorrhage could be hypertensive or from infarct with hemorrhagic conversion. Subarachnoid and intraventricular extension of hemorrhage, as above.   Electronically Signed   By: Jorje Guild M.D.   On: 09/25/2013 00:55     ASSESSMENT: Patient is a 78 yo with history of CAD.  Recent interventions to LAD and LCX with DES to each (08/25/13).  Was treated with ASA and Plavix.  Now presents with cerebellar hemorrhage.  Currently off of antiplatelet agents.  Plan for repeat head scan today to reassess.  I have reviewed with interventional colleagues.  Patient is a significant risk for subacute thrombosis to stented areas over time as he remains off of antiplatelet agents.  Need to follow repeat head  scans.  If no increase in bleed over time, from cardiac standpoint, would favor resumption of Plavix alone in a couple weeks.   Again, need input from neurosurgery in final decision.   Would resume statin Rx (Lipitor 80 mg)  Current BP management directed by neuro/neurosurgery   Will continue to follow.

## 2013-09-25 NOTE — Code Documentation (Signed)
78 yo wm brought in via [facility] for severe HA, N/V, & dizziness.  Per pt the HA began around 2130 & cont to get worse.  Pt with slight RUE pronation & drift, Rt facial droop, nystagmus, RLE sensory deficit.  See doc flowsheet for code stroke times.

## 2013-09-25 NOTE — ED Provider Notes (Signed)
CSN: [id number]     Arrival date & time 09/25/13  0024 History   First MD Initiated Contact with Patient 09/25/13 0031     Chief Complaint  Patient presents with  . Code Stroke     (Consider location/radiation/quality/duration/timing/severity/associated sxs/prior Treatment) HPI Patient was seen normal at 9:30 PM. This is when he went to sleep. He woke at 11:30 PM with posterior headache and room spinning sensation. He's had nausea with 3 episodes of vomiting. He is given given 8 mg of Zofran in route. Patient coronary artery disease requiring stenting as well as previous CVA without residual deficits. Patient denies chest pain or shortness of breath. He denies focal weakness or numbness.        Past Medical History  Diagnosis Date  . Lumbar stenosis     L5  . Hypothyroidism   . Hyperlipidemia   . Hypertension   . ED (erectile dysfunction)   . Internal carotid artery stenosis 5/15    bilateral, 40-59%  . Myocardial infarction 08/2013  . Heart murmur   . Stroke ~ 2011    "slight; no permanent damage"   Past Surgical History  Procedure Laterality Date  . Tonsillectomy and adenoidectomy    . Appendectomy    . Coronary angioplasty with stent placement  08/2013    "2"  . Cataract extraction, bilateral Bilateral ~ 2012   Family History  Problem Relation Age of Onset  . Cancer Sister     colon   History  Substance Use Topics  . Smoking status: Never Smoker   . Smokeless tobacco: Never Used  . Alcohol Use: Yes     Comment: 09/08/2013 "couple glasses of wine once/month"    Review of Systems  Eyes: Negative for visual disturbance.  Respiratory: Negative for shortness of breath.   Cardiovascular: Negative for chest pain.  Gastrointestinal: Positive for nausea and vomiting. Negative for abdominal pain.  Skin: Negative for rash and wound.  Neurological: Positive for dizziness and headaches. Negative for weakness and numbness.  All other systems reviewed and are  negative.     Allergies  Benazepril  Home Medications   Prior to Admission medications   Medication Sig Start Date End Date Taking? Authorizing Provider  acetaminophen (TYLENOL) 325 MG tablet Take 2 tablets (650 mg total) by mouth every 4 (four) hours as needed for headache or mild pain. 09/10/13   Erlene Quan, PA-C  albuterol (PROVENTIL) (5 MG/ML) 0.5% nebulizer solution Take 2.5 mg by nebulization every 6 (six) hours as needed for wheezing or shortness of breath.    Historical Provider, MD  aspirin 81 MG chewable tablet Chew 81 mg by mouth daily.    Historical Provider, MD  atorvastatin (LIPITOR) 80 MG tablet Take 80 mg by mouth every evening.    Historical Provider, MD  clopidogrel (PLAVIX) 75 MG tablet Take 75 mg by mouth daily.    Historical Provider, MD  diphenhydrAMINE (BENADRYL) 25 MG tablet Take 25 mg by mouth every 6 (six) hours as needed.    Historical Provider, MD  furosemide (LASIX) 40 MG tablet Take 1 tablet (40 mg total) by mouth daily. 09/11/13   Erlene Quan, PA-C  ipratropium (ATROVENT) 0.02 % nebulizer solution Take 0.5 mg by nebulization every 6 (six) hours as needed for wheezing or shortness of breath.    Historical Provider, MD  isosorbide mononitrate (IMDUR) 60 MG 24 hr tablet Take 60 mg by mouth daily.    Historical Provider, MD  lansoprazole (PREVACID SOLUTAB)  30 MG disintegrating tablet Take 30 mg by mouth daily.    Historical Provider, MD  levothyroxine (SYNTHROID, LEVOTHROID) 175 MCG tablet Take 175 mcg by mouth daily.    Historical Provider, MD  losartan (COZAAR) 25 MG tablet Take 1 tablet (25 mg total) by mouth daily. 09/10/13   Erlene Quan, PA-C  metoprolol (LOPRESSOR) 50 MG tablet Take 50 mg by mouth 2 (two) times daily.    Historical Provider, MD  nitroGLYCERIN (NITROSTAT) 0.4 MG SL tablet Place 1 tablet (0.4 mg total) under the tongue every 5 (five) minutes as needed for chest pain. 09/12/13   Darlin Coco, MD  zolpidem (AMBIEN) 5 MG tablet Take 1 tablet  (5 mg total) by mouth at bedtime as needed for sleep. 09/15/13   Susy Frizzle, MD   BP 153/71  Pulse 75  Temp(Src) 98.2 F (36.8 C) (Oral)  Resp 23  Ht 6' (1.829 m)  Wt 175 lb (79.379 kg)  BMI 23.73 kg/m2  SpO2 95% Physical Exam  Nursing note and vitals reviewed. Constitutional: He is oriented to person, place, and time. He appears well-developed and well-nourished. No distress.  HENT:  Head: Normocephalic and atraumatic.  Mouth/Throat: Oropharynx is clear and moist. No oropharyngeal exudate.  Eyes: EOM are normal. Pupils are equal, round, and reactive to light.  non-fatigable rotary nystagmus  Neck: Normal range of motion. Neck supple.  Cardiovascular: Normal rate and regular rhythm.   Pulmonary/Chest: Effort normal and breath sounds normal. No respiratory distress. He has no wheezes. He has no rales.  Abdominal: Soft. Bowel sounds are normal. He exhibits no distension and no mass. There is no tenderness. There is no rebound and no guarding.  Musculoskeletal: Normal range of motion. He exhibits no edema and no tenderness.  Neurological: He is alert and oriented to person, place, and time.  Mild right nasolabial fold flattening. 5/5 motor in all extremities. Sensation is grossly intact. Past pointing on finger-to-nose exam.  Skin: Skin is warm and dry. No rash noted. No erythema.  Psychiatric: He has a normal mood and affect. His behavior is normal.    ED Course  Procedures (including critical care time) Labs Review Labs Reviewed  PROTIME-INR - Abnormal; Notable for the following:    Prothrombin Time 17.0 (*)    All other components within normal limits  CBC - Abnormal; Notable for the following:    RBC 3.46 (*)    Hemoglobin 9.3 (*)    HCT 30.4 (*)    RDW 17.2 (*)    All other components within normal limits  DIFFERENTIAL - Abnormal; Notable for the following:    Neutrophils Relative % 82 (*)    Lymphocytes Relative 9 (*)    All other components within normal limits   COMPREHENSIVE METABOLIC PANEL - Abnormal; Notable for the following:    Sodium 133 (*)    Glucose, Bld 182 (*)    Albumin 2.7 (*)    Total Bilirubin 0.2 (*)    GFR calc non Af Amer 65 (*)    GFR calc Af Amer 75 (*)    All other components within normal limits  I-STAT CHEM 8, ED - Abnormal; Notable for the following:    Sodium 132 (*)    Glucose, Bld 189 (*)    Calcium, Ion 1.12 (*)    Hemoglobin 10.2 (*)    HCT 30.0 (*)    All other components within normal limits  CBG MONITORING, ED - Abnormal; Notable for the following:  Glucose-Capillary 170 (*)    All other components within normal limits  ETHANOL  APTT  URINE RAPID DRUG SCREEN (HOSP PERFORMED)  URINALYSIS, ROUTINE W REFLEX MICROSCOPIC  I-STAT TROPOININ, ED  I-STAT TROPOININ, ED  I-STAT CG4 LACTIC ACID, ED    Imaging Review Ct Head Wo Contrast  09/25/2013   CLINICAL DATA:  Headache and dizziness  EXAM: CT HEAD WITHOUT CONTRAST  TECHNIQUE: Contiguous axial images were obtained from the base of the skull through the vertex without intravenous contrast.  COMPARISON:  08/17/2008  FINDINGS: Skull and Sinuses:Negative for fracture or destructive process. The mastoids, middle ears, and imaged paranasal sinuses are clear.  Orbits: Bilateral cataract resection.  Brain: There is a parenchymal hematoma within the inferior right cerebellum including the right cerebellar tonsil. Maximal diameter 2.6 cm. Intraventricular extension at the level of the fourth ventricle, extending through the cerebral aqueduct into the lateral and third ventricles. There is extension into the foramen the Luschka on the left with CSF haziness at the foramen magnum likely admixed blood. There is mild lateral ventriculomegaly, but inproportion to atrophy. There is edema around the parenchymal hematoma. No discrete infarct.  Chronic small vessel disease with ischemic gliosis around the lateral ventricles.  Critical Value/emergent results were called by telephone at  the time of interpretation on 09/25/2013 at 12:50 am to Dr. Julianne Rice , who verbally acknowledged these results.  IMPRESSION: 2.6 x 1.9 cm hematoma in the inferior right cerebellum. The hemorrhage could be hypertensive or from infarct with hemorrhagic conversion. Subarachnoid and intraventricular extension of hemorrhage, as above.   Electronically Signed   By: Jorje Guild M.D.   On: 09/25/2013 00:55     EKG Interpretation None      Date: 09/25/2013  Rate: 75  Rhythm: normal sinus rhythm  QRS Axis: right axis deviation  Intervals: normal  ST/T Wave abnormalities: normal  Conduction Disutrbances:none  Narrative Interpretation:   Old EKG Reviewed: unchanged  CRITICAL CARE Performed by: Lita Mains, Lilja Soland Total critical care time: 30 min Critical care time was exclusive of separately billable procedures and treating other patients. Critical care was necessary to treat or prevent imminent or life-threatening deterioration. Critical care was time spent personally by me on the following activities: development of treatment plan with patient and/or surrogate as well as nursing, discussions with consultants, evaluation of patient's response to treatment, examination of patient, obtaining history from patient or surrogate, ordering and performing treatments and interventions, ordering and review of laboratory studies, ordering and review of radiographic studies, pulse oximetry and re-evaluation of patient's condition.  MDM   Final diagnoses:  None   Dr. Leonel Ramsay at bedside. Will admit patient. I also spoke with Dr. Ronnald Ramp. He does not believe that the patient needs surgical intervention at this point.     Julianne Rice, MD 09/25/13 [id number]

## 2013-09-25 NOTE — ED Notes (Addendum)
Pt to ED via [facility]- pt reports sudden onset of headache at 2330 with dizziness and nausea- EMS administered 8mg  Zofran.  Upon arrival to ED exam room right sided facial droop noted with right arm drift.  Code stroke called upon arrival to ED- LSN by wife at 930pm.  Pt presents with repetitive words- answering questions appropriately at this time.

## 2013-09-25 NOTE — Evaluation (Addendum)
Clinical/Bedside Swallow Evaluation Patient Details  Name: Taylor Keller MRN: [id number] Date of Birth: Dec 13, 1935  Today's Date: 09/25/2013 Time: 0910-0950 SLP Time Calculation (min): 40 min  Past Medical History:  Past Medical History  Diagnosis Date  . Lumbar stenosis     L5  . Hypothyroidism   . Hyperlipidemia   . Hypertension   . ED (erectile dysfunction)   . Internal carotid artery stenosis 5/15    bilateral, 40-59%  . Myocardial infarction 08/2013  . Heart murmur   . Stroke ~ 2011    "slight; no permanent damage"   Past Surgical History:  Past Surgical History  Procedure Laterality Date  . Tonsillectomy and adenoidectomy    . Appendectomy    . Coronary angioplasty with stent placement  08/2013    "2"  . Cataract extraction, bilateral Bilateral ~ 2012   HPI:  78 yo M with cerebellar hemorrhage. MRI shows 2.6 x 1.9 cm hematoma in the inferior right cerebellum. Pt also has a history of recent MI and cardiac arrest, intubated 7 days, who was discharged from [facility] general hospital on 7/31,  Pt was d/c'd home on Honey thick liquids. Readmitted to [facility] with CHF.  Found to have dysphagia with silent aspiration s/p extubation.but underwent an MBS on 8/3 showing mild sensori-motor pharyngeal dysphagia, characterized by decreased base of tongue contraction to the pharyngeal wall and decreased laryngeal elevation and epiglottic deflection. Pt recommended to consume a dys 3 diet with thin liquids with a chin tuck and second swallow to clear residue.   Assessment / Plan / Recommendation Clinical Impression  Pt demonstrates clinical evidence consistent with impaired sensation of aspiration or penetration of thin and nectar thick liquids. Pt with  a delayed cough and/or throat clear and occasional wet vocal quality. Swallow is currently delayed and laryngeal elevation is reduced (likely a baseline impairment based on prior MBS). With cues to clear throat and swallow again when  taking nectar thick liquids pt appears to expel penetrate and prevent further signs of aspiration. Despite cueing, pt was not able to complete adequate chin tuck. Recommend pt initiate a Dys 2 (finely chopped) diet with nectar thick liquids with probable upgrade of solids as pt becomes fully alert. Recommend ongoing SLP treatment for diet tolerance and progression, pt would benefit from CIR consult.     Aspiration Risk  Moderate    Diet Recommendation Dysphagia 2 (Fine chop);Nectar-thick liquid   Liquid Administration via: Cup Medication Administration: Whole meds with puree Supervision: Patient able to self feed;Full supervision/cueing for compensatory strategies Compensations: Slow rate;Small sips/bites;Clear throat intermittently;Multiple dry swallows after each bite/sip Postural Changes and/or Swallow Maneuvers: Seated upright 90 degrees    Other  Recommendations Oral Care Recommendations: Oral care BID   Follow Up Recommendations  Inpatient Rehab    Frequency and Duration min 2x/week  2 weeks   Pertinent Vitals/Pain NA    SLP Swallow Goals     Swallow Study Prior Functional Status       General HPI: 78 yo M with cerebellar hemorrhage. MRI shows 2.6 x 1.9 cm hematoma in the inferior right cerebellum. Pt also has a history of recent MI and cardiac arrest, intubated 7 days, who was discharged from [facility] general hospital on 7/31,  Pt was d/c'd home on Honey thick liquids. Readmitted to [facility] with CHF.  Found to have dysphagia with silent aspiration s/p extubation.but underwent an MBS on 8/3 showing mild sensori-motor pharyngeal dysphagia, characterized by decreased base of tongue contraction to the  pharyngeal wall and decreased laryngeal elevation and epiglottic deflection. Pt recommended to consume a dys 3 diet with thin liquids with a chin tuck and second swallow to clear residue. Type of Study: Modified Barium Swallowing Study Previous Swallow Assessment: Previous MBS at  [facility] following MI.  Positive silent aspiration of thin and nectar thick liquids.  See HPI for most recent MBS result Diet Prior to this Study: NPO Temperature Spikes Noted: No History of Recent Intubation: No Length of Intubations (days): 0 days Behavior/Cognition: Lethargic;Distractible;Requires cueing Oral Cavity - Dentition: Adequate natural dentition Self-Feeding Abilities: Able to feed self Patient Positioning: Upright in bed Baseline Vocal Quality: Low vocal intensity Volitional Cough: Strong Volitional Swallow: Able to elicit    Oral/Motor/Sensory Function Overall Oral Motor/Sensory Function: Appears within functional limits for tasks assessed   Ice Chips     Thin Liquid Thin Liquid: Impaired Presentation: Cup;Self Fed Oral Phase Impairments: Reduced labial seal Oral Phase Functional Implications: Prolonged oral transit Pharyngeal  Phase Impairments: Suspected delayed Swallow;Decreased hyoid-laryngeal movement;Cough - Delayed;Throat Clearing - Delayed;Wet Vocal Quality    Nectar Thick Nectar Thick Liquid: Impaired Presentation: Cup;Self Fed Oral Phase Impairments: Reduced labial seal Oral phase functional implications: Right anterior spillage;Prolonged oral transit Pharyngeal Phase Impairments: Suspected delayed Swallow;Decreased hyoid-laryngeal movement;Wet Vocal Quality;Throat Clearing - Delayed   Honey Thick Honey Thick Liquid: Not tested   Puree Puree: Impaired Presentation: Spoon;Self Fed Oral Phase Functional Implications: Prolonged oral transit Pharyngeal Phase Impairments: Decreased hyoid-laryngeal movement;Suspected delayed Swallow   Solid   GO    Solid: Impaired Presentation: Self Fed Pharyngeal Phase Impairments: Suspected delayed Swallow;Decreased hyoid-laryngeal movement      Herbie Baltimore, MA CCC-SLP [phone]  Jennfier Abdulla, Katherene Ponto 09/25/2013,10:49 AM

## 2013-09-25 NOTE — Progress Notes (Signed)
Patient ID: Taylor Keller, male   DOB: 25-Oct-1935, 78 y.o.   MRN: [id number] I have reviewed his MRI and I have discussed it with the radiologist. His hematoma has certainly increased in size by 30-40%. There is no brain stem compression. There is no hydrocephalus at this time. His neurologic exam has not changed. He opens his eyes and responds to me appropriately and answers questions appropriately. His headache is actually better this afternoon than it was this morning. His motor exam is nonfocal. He has no emesis. I had a long discussion with his wife. If he did not have his recent cardiac history with cardiac arrest and MI requiring drug-eluting stents and treatment with antiplatelet agents, then I think I might would favor suboccipital craniectomy for evacuation of the hematoma. However I think he would be at very significant cardiac risk with a general anesthetic and the stress of craniotomy. Especially given the fact that he is off of his antiplatelet agents. I think he is at significant risk of thrombosis of his stents, and yet he cannot be on antiplatelet agents in order to allow the hematoma in the cerebellum to coagulate appropriately and stop growing in size. This makes decision making extremely complex, as there is significant risk with any decision we make. I think at this point his risk is actually higher with surgery than without it. In other words, I think the risks with surgery outweigh the risk that he will deteriorate because of the hematoma. it is more likely he would deteriorate secondary to hydrocephalus, and that is treatable without craniotomy with a bedside procedure. I do think over the next 2-3 days he will have some swelling around the hemorrhage and he may close off the fourth ventricle and develop hydrocephalus, at which point he may need a ventriculostomy. I have discussed this at length with his wife.

## 2013-09-25 NOTE — ED Notes (Signed)
Dr Yelverton at bedside.  

## 2013-09-25 NOTE — Progress Notes (Signed)
UR completed.  Zilla Shartzer, RN BSN MHA CCM Trauma/Neuro ICU Case Manager [phone]  

## 2013-09-26 ENCOUNTER — Inpatient Hospital Stay (HOSPITAL_COMMUNITY): Payer: Medicare HMO

## 2013-09-26 DIAGNOSIS — I10 Essential (primary) hypertension: Secondary | ICD-10-CM

## 2013-09-26 DIAGNOSIS — Z8674 Personal history of sudden cardiac arrest: Secondary | ICD-10-CM

## 2013-09-26 DIAGNOSIS — E785 Hyperlipidemia, unspecified: Secondary | ICD-10-CM

## 2013-09-26 LAB — CBC
HCT: 30.3 % — ABNORMAL LOW (ref 39.0–52.0)
HEMOGLOBIN: 9.5 g/dL — AB (ref 13.0–17.0)
MCH: 27.3 pg (ref 26.0–34.0)
MCHC: 31.4 g/dL (ref 30.0–36.0)
MCV: 87.1 fL (ref 78.0–100.0)
Platelets: 297 10*3/uL (ref 150–400)
RBC: 3.48 MIL/uL — AB (ref 4.22–5.81)
RDW: 16.9 % — ABNORMAL HIGH (ref 11.5–15.5)
WBC: 9.7 10*3/uL (ref 4.0–10.5)

## 2013-09-26 LAB — BASIC METABOLIC PANEL
Anion gap: 13 (ref 5–15)
BUN: 14 mg/dL (ref 6–23)
CO2: 25 meq/L (ref 19–32)
Calcium: 9.3 mg/dL (ref 8.4–10.5)
Chloride: 96 mEq/L (ref 96–112)
Creatinine, Ser: 1.05 mg/dL (ref 0.50–1.35)
GFR calc Af Amer: 78 mL/min — ABNORMAL LOW (ref >90)
GFR calc non Af Amer: 67 mL/min — ABNORMAL LOW (ref >90)
GLUCOSE: 119 mg/dL — AB (ref 70–99)
POTASSIUM: 4.6 meq/L (ref 3.7–5.3)
Sodium: 134 mEq/L — ABNORMAL LOW (ref 137–147)

## 2013-09-26 LAB — TROPONIN I

## 2013-09-26 MED ORDER — HYDROCODONE-ACETAMINOPHEN 5-325 MG PO TABS
1.0000 | ORAL_TABLET | Freq: Once | ORAL | Status: AC
  Administered 2013-09-26: 1 via ORAL
  Filled 2013-09-26: qty 1

## 2013-09-26 MED ORDER — BACLOFEN 10 MG PO TABS
5.0000 mg | ORAL_TABLET | Freq: Three times a day (TID) | ORAL | Status: DC
  Administered 2013-09-26 – 2013-09-30 (×13): 5 mg via ORAL
  Filled 2013-09-26 (×15): qty 1

## 2013-09-26 MED ORDER — PANTOPRAZOLE SODIUM 40 MG PO TBEC
40.0000 mg | DELAYED_RELEASE_TABLET | Freq: Every day | ORAL | Status: DC
  Administered 2013-09-26 – 2013-09-30 (×5): 40 mg via ORAL
  Filled 2013-09-26 (×5): qty 1

## 2013-09-26 MED ORDER — LOSARTAN POTASSIUM 50 MG PO TABS
50.0000 mg | ORAL_TABLET | Freq: Every day | ORAL | Status: DC
  Administered 2013-09-26 – 2013-09-30 (×5): 50 mg via ORAL
  Filled 2013-09-26 (×5): qty 1

## 2013-09-26 NOTE — Evaluation (Signed)
Physical Therapy Evaluation Patient Details Name: Taylor Keller MRN: [id number] DOB: 1935-04-26 Today's Date: 09/26/2013   History of Present Illness  78 yo wm brought in via [facility] for severe HA, N/V, & dizziness. Per pt the HA began around 2130 & cont to get worse. Pt with slight RUE pronation & drift, Rt facial droop, nystagmus, RLE sensory deficit. Imaging reveals cerebellar hemorrhage.  Clinical Impression  Patient demonstrates deficits in functional mobility as indicated below. Will need continued skilled PT to address deficits and maximize function. Will need CIR to facilitate safe functional mobility for eventual discharge home.    Follow Up Recommendations CIR    Equipment Recommendations  Other (comment) (TBD)    Recommendations for Other Services Rehab consult     Precautions / Restrictions Precautions Precautions: Fall Restrictions Weight Bearing Restrictions: No      Mobility  Bed Mobility Overal bed mobility: Needs Assistance Bed Mobility: Supine to Sit     Supine to sit: Mod assist     General bed mobility comments: Increased time to perform, assist for stability and positioning, assist for elevation to upright and hip rotation to EOB  Transfers Overall transfer level: Needs assistance Equipment used: 2 person hand held assist Transfers: Sit to/from [employer] Transfers Sit to Stand: Mod assist;+2 physical assistance Stand pivot transfers: Mod assist;+2 physical assistance          Ambulation/Gait Ambulation/Gait assistance: Mod assist;+2 physical assistance Ambulation Distance (Feet): 8 Feet   Gait Pattern/deviations: Step-through pattern;Steppage;Ataxic;Staggering left;Staggering right;Narrow base of support Gait velocity: decreased Gait velocity interpretation: Below normal speed for age/gender General Gait Details: Patient very unsteady, steps very effortful and uncoordinated. Inability to control body positioning during upright  requires significant assist with posterior bias.  Stairs            Wheelchair Mobility    Modified Rankin (Stroke Patients Only)       Balance Overall balance assessment: Needs assistance   Sitting balance-Leahy Scale: Fair     Standing balance support: Bilateral upper extremity supported Standing balance-Leahy Scale: Poor Standing balance comment: posterior bias                             Pertinent Vitals/Pain Pain Assessment: 0-10 Pain Score: 4  Pain Location: left temporal headache Pain Descriptors / Indicators: Aching;Constant Pain Intervention(s): Limited activity within patient's tolerance;Patient requesting pain meds-RN notified    Home Living Family/patient expects to be discharged to:: Private residence Living Arrangements: Spouse/significant other Available Help at Discharge: Family;Available 24 hours/day Type of Home: House Home Access: Stairs to enter Entrance Stairs-Rails: Left Entrance Stairs-Number of Steps: 4 Home Layout: Multi-level;Able to live on main level with bedroom/bathroom Home Equipment: Gilford Rile - 2 wheels;Bedside commode      Prior Function Level of Independence: Independent               Hand Dominance   Dominant Hand: Right    Extremity/Trunk Assessment   Upper Extremity Assessment: Defer to OT evaluation             RLE Deficits / Details: RLE with modest deficits in strength, coordination in bed [facility] but in standing very uncoordinated    Cervical / Trunk Assessment: Normal  Communication   Communication: No difficulties  Cognition Arousal/Alertness: Awake/alert Behavior During Therapy: WFL for tasks assessed/performed Overall Cognitive Status: Within Functional Limits for tasks assessed  General Comments      Exercises        Assessment/Plan    PT Assessment Patient needs continued PT services  PT Diagnosis Difficulty walking;Generalized weakness   PT  Problem List Decreased strength;Decreased range of motion;Decreased activity tolerance;Decreased balance;Decreased mobility;Decreased knowledge of use of DME;Decreased safety awareness;Cardiopulmonary status limiting activity;Decreased knowledge of precautions  PT Treatment Interventions DME instruction;Gait training;Stair training;Functional mobility training;Therapeutic activities;Therapeutic exercise;Neuromuscular re-education;Patient/family education   PT Goals (Current goals can be found in the Care Plan section) Acute Rehab PT Goals Patient Stated Goal: To return home independently PT Goal Formulation: With patient/family Time For Goal Achievement: 10/10/13 Potential to Achieve Goals: Good    Frequency Min 3X/week   Barriers to discharge        Co-evaluation               End of Session Equipment Utilized During Treatment: Gait belt Activity Tolerance: Patient tolerated treatment well Patient left: in chair;with call bell/phone within reach;with family/visitor present Nurse Communication: Mobility status         Time: 3704-8889 PT Time Calculation (min): 18 min   Charges:   PT Evaluation $Initial PT Evaluation Tier I: 1 Procedure PT Treatments $Therapeutic Activity: 8-22 mins   PT G CodesDuncan Keller 09/26/2013, 1:43 PM Taylor Keller, Taylor Keller  [phone]

## 2013-09-26 NOTE — Progress Notes (Addendum)
  Cardiologist: Dr. Meda Coffee  Subjective:  Feeling better. +HA remains  Objective:  Vital Signs in the last 24 hours: Temp:  [97.3 F (36.3 C)-98.1 F (36.7 C)] 97.9 F (36.6 C) (08/21 0800) Pulse Rate:  [63-81] 74 (08/21 1044) Resp:  [10-25] 21 (08/21 1000) BP: (125-171)/(54-76) 163/67 mmHg (08/21 1044) SpO2:  [94 %-99 %] 96 % (08/21 1000)  Intake/Output from previous day: 08/20 0701 - 08/21 0700 In: 72 [P.O.:115; IV Piggyback:200] Out: 1500 [Urine:1500]   Physical Exam: General: Well developed, well nourished, in no acute distress. Head:  Normocephalic and atraumatic. Was wearing eye patch. Lungs: Clear to auscultation and percussion. Heart: Normal S1 and S2.  No murmur, rubs or gallops.   Lab Results:  Recent Labs  09/25/13 1100 09/26/13 0050  WBC 8.0 9.7  HGB 9.0* 9.5*  PLT 281 297    Recent Labs  09/25/13 1100 09/26/13 0050  NA 133* 134*  K 4.5 4.6  CL 95* 96  CO2 24 25  GLUCOSE 118* 119*  BUN 15 14  CREATININE 0.96 1.05    Recent Labs  09/25/13 1645 09/26/13 0050  TROPONINI <0.30 <0.30   Hepatic Function Panel  Recent Labs  09/25/13 0045  PROT 7.8  ALBUMIN 2.7*  AST 17  ALT 20  ALKPHOS 80  BILITOT 0.2*    Imaging:  Personally viewed.   Assessment/Plan:  Active Problems:   ICH (intracerebral hemorrhage)   78 year old with DES placed 08/25/13 following cardiac arrest [facility]) normal EF on Plavix and ASA with ICH.   1) CAD/ICH - resume Plavix when comfortable with neuro team. Wife and he understand risk of stent thrombosis. Dr. Harrington Challenger discussed with interventional colleagues. Patient is a significant risk for subacute thrombosis to stented areas over time as he remains off of antiplatelet agents. Obviously need to weigh risks vs benefits. Appreciate team approach with neuro.   He has improved from clinical standpoint per nursing. Some nystagmus.   Reviewed neuro notes.   2) HTN -  resumed home BP meds. Increase  losartan if BP remains elevated. Could add amlodipine if necessary.   Will follow.     Josi Roediger, [place] 09/26/2013, 10:51 AM

## 2013-09-26 NOTE — Progress Notes (Signed)
Patient ID: Taylor Keller, male   DOB: 29-May-1935, 78 y.o.   MRN: [id number] Subjective: Patient reports some left parietal headache. He is much better than yesterday. No emesis. Headache is improving.  Objective: Vital signs in last 24 hours: Temp:  [97.3 F (36.3 C)-98.1 F (36.7 C)] 97.9 F (36.6 C) (08/21 0800) Pulse Rate:  [63-81] 72 (08/21 0800) Resp:  [10-28] 20 (08/21 0800) BP: (125-171)/(54-76) 158/64 mmHg (08/21 0800) SpO2:  [94 %-100 %] 96 % (08/21 0800)  Intake/Output from previous day: 08/20 0701 - 08/21 0700 In: 315 [P.O.:115; IV Piggyback:200] Out: 1500 [Urine:1500] Intake/Output this shift:    Neurologic: Grossly normal, he is much more awake and alert he is smiling and happy and conversant. He moves all extremities equally.  Lab Results: Lab Results  Component Value Date   WBC 9.7 09/26/2013   HGB 9.5* 09/26/2013   HCT 30.3* 09/26/2013   MCV 87.1 09/26/2013   PLT 297 09/26/2013   Lab Results  Component Value Date   INR 1.38 09/25/2013   BMET Lab Results  Component Value Date   NA 134* 09/26/2013   K 4.6 09/26/2013   CL 96 09/26/2013   CO2 25 09/26/2013   GLUCOSE 119* 09/26/2013   BUN 14 09/26/2013   CREATININE 1.05 09/26/2013   CALCIUM 9.3 09/26/2013    Studies/Results: Ct Head Wo Contrast  09/25/2013   CLINICAL DATA:  Headache and dizziness  EXAM: CT HEAD WITHOUT CONTRAST  TECHNIQUE: Contiguous axial images were obtained from the base of the skull through the vertex without intravenous contrast.  COMPARISON:  08/17/2008  FINDINGS: Skull and Sinuses:Negative for fracture or destructive process. The mastoids, middle ears, and imaged paranasal sinuses are clear.  Orbits: Bilateral cataract resection.  Brain: There is a parenchymal hematoma within the inferior right cerebellum including the right cerebellar tonsil. Maximal diameter 2.6 cm. Intraventricular extension at the level of the fourth ventricle, extending through the cerebral aqueduct into the lateral  and third ventricles. There is extension into the foramen the Luschka on the left with CSF haziness at the foramen magnum likely admixed blood. There is mild lateral ventriculomegaly, but inproportion to atrophy. There is edema around the parenchymal hematoma. No discrete infarct.  Chronic small vessel disease with ischemic gliosis around the lateral ventricles.  Critical Value/emergent results were called by telephone at the time of interpretation on 09/25/2013 at 12:50 am to Dr. Julianne Rice , who verbally acknowledged these results.  IMPRESSION: 2.6 x 1.9 cm hematoma in the inferior right cerebellum. The hemorrhage could be hypertensive or from infarct with hemorrhagic conversion. Subarachnoid and intraventricular extension of hemorrhage, as above.   Electronically Signed   By: Jorje Guild M.D.   On: 09/25/2013 00:55   Mr Jodene Nam Head Wo Contrast  09/25/2013   CLINICAL DATA:  Headache with RIGHT-sided weakness. Abnormal CT scan.  EXAM: MRI HEAD WITHOUT CONTRAST  MRA HEAD WITHOUT CONTRAST  TECHNIQUE: Multiplanar, multiecho pulse sequences of the brain and surrounding structures were obtained without intravenous contrast. Angiographic images of the head were obtained using MRA technique without contrast.  COMPARISON:  CT head earlier in the day.  FINDINGS: MRI HEAD FINDINGS  The previously identified RIGHT cerebellar hematoma has increased in size. Cross-section measurements on current images are 27 x 39 x 28 mm (R-L x A-P x C-C) as compared with 19 x 26 mm earlier today. This computes to an approximate volume of 15 mL. There is increasing surrounding edema. There is mild compression of the  fourth ventricle and displacement RIGHT to LEFT. Intraventricular extension of blood is noted in the lateral and third ventricles. There is mild hydrocephalus with enlargement of the lateral and third ventricles including temporal horns. Transependymal absorption surrounds the lateral ventricles. On gradient sequence, there  is a tiny a focus of chronic hemorrhage in the RIGHT posterior frontal subcortical white matter, not present in 2010, not clearly present on CT, likely representing chronic microbleed.  No other acute findings.  Otherwise stable chronic changes.  MRA HEAD FINDINGS  The internal carotid arteries are dolichoectatic but patent. The basilar artery is widely patent with LEFT vertebral dominant. RIGHT vertebral ends in PICA. No intracranial stenosis or aneurysm. No arteriovenous malformation or berry aneurysm is seen.  IMPRESSION: Enlarging RIGHT cerebellar hematoma, now 27 x 39 x 28 mm. Etiology likely related to combination of hypertension in the setting of anticoagulation. Increasing intraventricular hemorrhage and early hydrocephalus. A call is into the consulting neurosurgeon.   Electronically Signed   By: Rolla Flatten M.D.   On: 09/25/2013 12:40   Mr Brain Wo Contrast  09/25/2013   CLINICAL DATA:  Headache with RIGHT-sided weakness. Abnormal CT scan.  EXAM: MRI HEAD WITHOUT CONTRAST  MRA HEAD WITHOUT CONTRAST  TECHNIQUE: Multiplanar, multiecho pulse sequences of the brain and surrounding structures were obtained without intravenous contrast. Angiographic images of the head were obtained using MRA technique without contrast.  COMPARISON:  CT head earlier in the day.  FINDINGS: MRI HEAD FINDINGS  The previously identified RIGHT cerebellar hematoma has increased in size. Cross-section measurements on current images are 27 x 39 x 28 mm (R-L x A-P x C-C) as compared with 19 x 26 mm earlier today. This computes to an approximate volume of 15 mL. There is increasing surrounding edema. There is mild compression of the fourth ventricle and displacement RIGHT to LEFT. Intraventricular extension of blood is noted in the lateral and third ventricles. There is mild hydrocephalus with enlargement of the lateral and third ventricles including temporal horns. Transependymal absorption surrounds the lateral ventricles. On  gradient sequence, there is a tiny a focus of chronic hemorrhage in the RIGHT posterior frontal subcortical white matter, not present in 2010, not clearly present on CT, likely representing chronic microbleed.  No other acute findings.  Otherwise stable chronic changes.  MRA HEAD FINDINGS  The internal carotid arteries are dolichoectatic but patent. The basilar artery is widely patent with LEFT vertebral dominant. RIGHT vertebral ends in PICA. No intracranial stenosis or aneurysm. No arteriovenous malformation or berry aneurysm is seen.  IMPRESSION: Enlarging RIGHT cerebellar hematoma, now 27 x 39 x 28 mm. Etiology likely related to combination of hypertension in the setting of anticoagulation. Increasing intraventricular hemorrhage and early hydrocephalus. A call is into the consulting neurosurgeon.   Electronically Signed   By: Rolla Flatten M.D.   On: 09/25/2013 12:40    Assessment/Plan: Doing much better today from a clinical standpoint. Would repeat head CT tomorrow. If this clot is stable I would consider starting him back on aspirin but not Plavix. I think that keeping him off of antiplatelet agents makes the risk of a coronary event much higher than the risk of extension of his hemorrhage because of the use of aspirin. Therefore I think we have to be aggressive in resuming his antiplatelet agents as early as possible. The Plavix should probably be held for longer.    LOS: 1 day    Jaleah Lefevre S 09/26/2013, 10:03 AM

## 2013-09-26 NOTE — Progress Notes (Signed)
Speech Language Pathology Treatment: Dysphagia;Cognitive-Linquistic  Patient Details Name: Taylor Keller MRN: [id number] DOB: [birth year] Today's Date: 09/26/2013 Time: 2536-6440 SLP Time Calculation (min): 23 min  Assessment / Plan / Recommendation Clinical Impression  Pt with improved MS - alert, conversant; speech mildly hypernasal/dysarthric.  Pt with improved intellectual awareness of deficits s/p bleed.   Consumed applesauce and nectar-thickened liquids with intermittent throat-clearing, wet-phonation, and min verbal cues required for precautions. Recommend pt remain on current diet through weekend to maximize safety.  SLP to f/u Monday, 8/24 to determine readiness for diet advancement and continue cognitive f/u if warranted.  Discussed with pt/wife, who are in agreement.    HPI HPI: 78 yo M with cerebellar hemorrhage. MRI shows 2.6 x 1.9 cm hematoma in the inferior right cerebellum. Pt also has a history of recent MI and cardiac arrest, intubated 7 days, who was discharged from [facility] general hospital on 7/31,  Pt was d/c'd home on Honey thick liquids. Readmitted to [facility] with CHF.  Found to have dysphagia with silent aspiration s/p extubation.but underwent an MBS on 8/3 showing mild sensori-motor pharyngeal dysphagia, characterized by decreased base of tongue contraction to the pharyngeal wall and decreased laryngeal elevation and epiglottic deflection. Pt recommended to consume a dys 3 diet with thin liquids with a chin tuck and second swallow to clear residue.       SLP Plan  Continue with current plan of care    Recommendations Diet recommendations: Dysphagia 2 (fine chop);Nectar-thick liquid Liquids provided via: Cup Medication Administration: Whole meds with puree Supervision: Patient able to self feed;Full supervision/cueing for compensatory strategies Compensations: Slow rate;Small sips/bites;Clear throat intermittently;Multiple dry swallows after each bite/sip Postural  Changes and/or Swallow Maneuvers: Seated upright 90 degrees              Plan: Continue with current plan of care   Taylor Keller L. Tivis Ringer, [place] CCC/SLP Pager [phone]      Taylor Keller 09/26/2013, 11:47 AM

## 2013-09-26 NOTE — Progress Notes (Signed)
Rehab Admissions Coordinator Note:  Patient was screened by Deyona Soza L for appropriateness for an Inpatient Acute Rehab Consult.  At this time, we are recommending Inpatient Rehab consult.  Amyrie Illingworth, PT Rehabilitation Admissions Coordinator [phone]  

## 2013-09-26 NOTE — Progress Notes (Signed)
Occupational Therapy Evaluation Patient Details Name: Taylor Keller MRN: [id number] DOB: 10/05/1935 Today's Date: 09/26/2013    History of Present Illness 78 yo wm brought in via [facility] for severe HA, N/V, & dizziness. Imaging reveals R cerebellar hemorrhage.   Clinical Impression   PTA, pt independent with ADL and mobility. Retired Theme park manager. Pt presents with significant ataxia and RUE dysmetria with + nystagmus (greater to R). Pt c/o nausea with minimal activity and blurred vision. Began visual stabilization exercises. Also discussed using a focal point when moving to decrease c/o n/v with movement. Pt able to read when 1 eye occluded and states it makes him feel better. Rec use of eye patch at this time to be alternated q 2 hrs as tolerated. Rec CIR for rehab to facilitate safe D/C home with 24/7 S of wife. Pt will benefit from skilled OT services to facilitate D/C to CIR due to below deficits.    Follow Up Recommendations  CIR;Supervision/Assistance - 24 hour    Equipment Recommendations  None recommended by OT    Recommendations for Other Services Rehab consult     Precautions / Restrictions Precautions Precautions: Fall Precaution Comments: c/o n/v Restrictions Weight Bearing Restrictions: No      Mobility Bed Mobility Overal bed mobility: Needs Assistance Bed Mobility: Supine to Sit     Supine to sit: Mod assist     General bed mobility comments: Increased time to perform, assist for stability and positioning, assist for elevation to upright and hip rotation to EOB  Transfers Overall transfer level: Needs assistance Equipment used: 2 person hand held assist Transfers: Sit to/from Stand Sit to Stand: Mod assist;+2 physical assistance;+2 safety/equipment Stand pivot transfers: Mod assist;+2 physical assistance       General transfer comment: ataxic gait pattern/trunkal ataxia    Balance Overall balance assessment: Needs assistance Sitting-balance support:  Feet supported;Bilateral upper extremity supported Sitting balance-Leahy Scale: Fair     Standing balance support: Bilateral upper extremity supported Standing balance-Leahy Scale: Poor Standing balance comment: posterior bias                            ADL Overall ADL's : Needs assistance/impaired Eating/Feeding: Minimal assistance;Sitting Eating/Feeding Details (indicate cue type and reason): dificulty controlling utensil and judging depth to self feed Grooming: Moderate assistance Grooming Details (indicate cue type and reason): movement causing nausea Upper Body Bathing: Moderate assistance;Sitting   Lower Body Bathing: Maximal assistance;Sit to/from stand   Upper Body Dressing : Moderate assistance;Sitting   Lower Body Dressing: Maximal assistance;Sit to/from stand   Toilet Transfer: Moderate assistance   Toileting- Clothing Manipulation and Hygiene: Moderate assistance;Sit to/from stand       Functional mobility during ADLs: Moderate assistance General ADL Comments: ADL significantly limited by c/o dizziness and nausea and ataxia     Vision Eye Alignment: Within Functional Limits Alignment/Gaze Preference: Within Defined Limits Ocular Range of Motion: Within Functional Limits Tracking/Visual Pursuits: Decreased smoothness of horizontal tracking;Decreased smoothness of vertical tracking Saccades: Additional eye shifts occurred during testing;Decreased speed of saccadic movement Convergence: Within functional limits   Depth Perception: Overshoots Additional Comments: + nystagmus, greater to R. Pt with difficulty reading. Pt able to read when R eye occluded. Occluding 1 eye decreases blurry vision and c/o dizziness.    Perception     Praxis Praxis Praxis tested?: Within functional limits    Pertinent Vitals/Pain Pain Assessment: 0-10 Pain Score: 5  Pain Location: head Pain Descriptors /  Indicators: Aching Pain Intervention(s): Limited activity  within patient's tolerance;Monitored during session     Hand Dominance Right   Extremity/Trunk Assessment Upper Extremity Assessment Upper Extremity Assessment: RUE deficits/detail RUE Deficits / Details: general weakness. Ataxic RUE Coordination: decreased fine motor;decreased gross motor   Lower Extremity Assessment Lower Extremity Assessment: Defer to PT evaluation RLE Deficits / Details: RLE with modest deficits in strength, coordination in bed [facility] but in standing very uncoordinated   Cervical / Trunk Assessment Cervical / Trunk Assessment: Normal   Communication Communication Communication:  (mild difficulty with expressive language)   Cognition Arousal/Alertness: Awake/alert Behavior During Therapy: WFL for tasks assessed/performed Overall Cognitive Status: Impaired/Different from baseline Area of Impairment: Safety/judgement         Safety/Judgement: Decreased awareness of safety         General Comments       Exercises Exercises: Other exercises Other Exercises Other Exercises: Began gaze stabilizatio exercises   Shoulder Instructions      Maurie Boettcher, OTR/L  [phone] 09-03-2015Home Living Family/patient expects to be discharged to:: Private residence Living Arrangements: Spouse/significant other Available Help at Discharge: Family;Available 24 hours/day Type of Home: House Home Access: Stairs to enter [employer] of Steps: 4 Entrance Stairs-Rails: Left Home Layout: Multi-level;Able to live on main level with bedroom/bathroom     Bathroom Shower/Tub: Occupational psychologist: Standard Bathroom Accessibility: Yes How Accessible: Accessible via walker Home Equipment: [place] - 2 wheels;Bedside commode      Lives With: Spouse    Prior Functioning/Environment Level of Independence: Independent        Comments: retired Theme park manager    OT Diagnosis: Generalized weakness;Cognitive deficits;Disturbance of vision;Ataxia   OT  Problem List: Decreased strength;Decreased activity tolerance;Impaired balance (sitting and/or standing);Impaired vision/perception;Decreased coordination;Decreased knowledge of use of DME or AE;Impaired UE functional use   OT Treatment/Interventions: Self-care/ADL training;Therapeutic exercise;Neuromuscular education;DME and/or AE instruction;Therapeutic activities;Cognitive remediation/compensation;Visual/perceptual remediation/compensation;Patient/family education;Balance training    OT Goals(Current goals can be found in the care plan section) Acute Rehab OT Goals Patient Stated Goal: to be able to take care of myself OT Goal Formulation: With patient Time For Goal Achievement: 10/10/13 Potential to Achieve Goals: Good  OT Frequency: Min 3X/week   Barriers to D/C:            Co-evaluation              End of Session Nurse Communication: Mobility status  Activity Tolerance: Other (comment);Treatment limited secondary to medical complications (Comment) (limited byc/o nausea/vomiting) Patient left: in chair;with call bell/phone within reach;with family/visitor present   Time: 1254-1330 OT Time Calculation (min): 36 min Charges:  OT General Charges $OT Visit: 1 Procedure OT Evaluation $Initial OT Evaluation Tier I: 1 Procedure OT Treatments $Therapeutic Activity: 8-22 mins G-Codes:    Kyair Ditommaso,HILLARY 2013-10-09, 2:22 PM

## 2013-09-26 NOTE — Progress Notes (Signed)
Received call from Docia Barrier, RN Case Manager from [place].  She wanted a call regarding discharge plans when they are known for this patient.  Pt is currently being screened for CIR.  If patient needs SNF, her message indicated that that should be authorized by Su Hoff at [phone].  If the patient requires [facility], his network includes [place], [place] and [place].  She also asked that the d/c summary be faxed to Harlin Rain at [phone].  Lennell Shanks's call back number is [phone]. [phone]. I have called her back to let her know pt was seen by PT/OT today and that they are recommending CIR.   Sandi Mariscal, RN BSN MHA CCM  Case Manager, Trauma Service/Unit 38M [phone]

## 2013-09-26 NOTE — Progress Notes (Signed)
STROKE TEAM PROGRESS NOTE   HISTORY MOSTAFA YUAN is a 78 y.o. male with a history of cardiac arrest in July who presents with headache that started earlier tonight. He states that it started around 9:30 pm 09/24/2013 and has been getting worse. In the ED, he was noted to have right sided weakness as well.  Of note, he had a vfib arrest in July following MI and has been recovering from this. He had two DES placed at that time to the LAD and left circumflex arteries.  Patient was not administered TPA secondary to hemorrhage. He was admitted to the neuro ICU for further evaluation and treatment.  SUBJECTIVE (INTERVAL HISTORY) His wife is at the bedside.  Overall he feels his condition is stable. He still awake alert following commands. Overnight had one episode of N/V. This morning also some nausia too but currently resolved. He still has intermittent hiccup. PT/OT has started working with him.   OBJECTIVE Temp:  [97.3 F (36.3 C)-98.1 F (36.7 C)] 98.1 F (36.7 C) (08/21 1500) Pulse Rate:  [63-81] 76 (08/21 1400) Resp:  [10-24] 22 (08/21 1400) BP: (125-171)/(54-76) 156/71 mmHg (08/21 1400) SpO2:  [94 %-99 %] 95 % (08/21 1400)  BMET    Component Value Date/Time   NA 134* 09/26/2013 0050   K 4.6 09/26/2013 0050   CL 96 09/26/2013 0050   CO2 25 09/26/2013 0050   GLUCOSE 119* 09/26/2013 0050   BUN 14 09/26/2013 0050   CREATININE 1.05 09/26/2013 0050   CREATININE 1.12 09/19/2013 1524   CALCIUM 9.3 09/26/2013 0050   GFRNONAA 67* 09/26/2013 0050   GFRNONAA 63 09/19/2013 1524   GFRAA 78* 09/26/2013 0050   GFRAA 73 09/19/2013 1524   CBC    Component Value Date/Time   WBC 9.7 09/26/2013 0050   RBC 3.48* 09/26/2013 0050   HGB 9.5* 09/26/2013 0050   HCT 30.3* 09/26/2013 0050   PLT 297 09/26/2013 0050   MCV 87.1 09/26/2013 0050   MCH 27.3 09/26/2013 0050   MCHC 31.4 09/26/2013 0050   RDW 16.9* 09/26/2013 0050   LYMPHSABS 0.8 09/25/2013 0045   MONOABS 0.7 09/25/2013 0045   EOSABS 0.1 09/25/2013 0045    BASOSABS 0.0 09/25/2013 0045   Lipid Panel     Component Value Date/Time   CHOL 99 05/13/2013 0833   TRIG 48 05/13/2013 0833   HDL 38* 05/13/2013 0833   CHOLHDL 2.6 05/13/2013 0833   VLDL 10 05/13/2013 0833   LDLCALC 51 05/13/2013 0833   Drugs of Abuse     Component Value Date/Time   LABOPIA NONE DETECTED 09/25/2013 0312   LABOPIA NEGATIVE 08/18/2008 0400   COCAINSCRNUR NONE DETECTED 09/25/2013 0312   COCAINSCRNUR NEGATIVE 08/18/2008 0400   LABBENZ NONE DETECTED 09/25/2013 0312   LABBENZ NEGATIVE 08/18/2008 0400   AMPHETMU NONE DETECTED 09/25/2013 0312   AMPHETMU NEGATIVE 08/18/2008 0400   THCU NONE DETECTED 09/25/2013 0312   LABBARB NONE DETECTED 09/25/2013 0312     PHYSICAL EXAM Physical exam  Temp:  [97.3 F (36.3 C)-98.1 F (36.7 C)] 98.1 F (36.7 C) (08/21 1500) Pulse Rate:  [63-81] 76 (08/21 1400) Resp:  [10-24] 22 (08/21 1400) BP: (125-171)/(54-76) 156/71 mmHg (08/21 1400) SpO2:  [94 %-99 %] 95 % (08/21 1400)  General - Well nourished, well developed, in no apparent distress.  Ophthalmologic - not able to see through.  Cardiovascular - Regular rate and rhythm with no murmur.  Mental Status -  Level of arousal and orientation to time,  place, and person were intact. Language including expression, naming, repetition, comprehension was assessed and found intact.  Cranial Nerves II - XII - II - Visual field intact OU. III, IV, VI - Extraocular movements intact, but had left eye gaze preference, horizontal nystagumus on right gaze. V - Facial sensation intact bilaterally. VII - Facial movement intact bilaterally. VIII - Hearing & vestibular intact bilaterally. X - Palate elevates symmetrically. XI - Chin turning & shoulder shrug intact bilaterally. XII - Tongue protrusion intact.  Motor Strength - The patient's strength was normal in all extremities and pronator drift was absent.  Bulk was normal and fasciculations were absent.   Motor Tone - Muscle tone was assessed at the  neck and appendages and was normal.  Reflexes - The patient's reflexes were normal in all extremities and he had no pathological reflexes.  Sensory - Light touch, temperature/pinprick were assessed and were normal.    Coordination - The patient had dysmetria on the right hand, no obvious ataxia.  Tremor was absent.  Gait and Station - not tested due to BP concern.   ASSESSMENT/PLAN  Mr. CHARLE CLEAR is a 78 y.o. male with hx of vfib cardiac arrest, MI with stent placement in July of this year presenting with headache and right sided weakness.  Imaging confirms a right cerebellar hemorrhage infarct. Volume:  15 cc. Stroke work up underway. Antiplatelet discontinued. Will have cardiology consult. NSG on board. MRI showed slightly enlargement of hematoma but MRI tends to over estimate bleeding comparing with CT. No significant hydrocephalus comparing with CT overnight. He is neurologically intact, will not consider surgical intervention at this moment. NSG is following.   Stroke:  right cerebellar hemorrhage secondary to hypertension likely     Continue ICU level care   OOB   MRI brain showed slightly enlargement of hematoma but MRI tends to over estimate bleeding comparing with CT. No significant hydrocephalus comparing with CT overnight.  Repeat CT tomorrow  aspirin 81 mg orally every day and clopidogrel 75 mg orally every day prior to admission due to recent stent, now off due to hemorrhage.  Agree with cardiology, would consider to resume plavix alone in about 2 weeks if bleeding is stable  Would hold off lipitor for now since it is debatable whether statin may worsening cerebral bleeding. Would consider to resume lipitor in about 5-7 days.  Cardiology input appreciated.  NSG is on board and following. Currently no surgical intervention. Will close monitoring.   2D Echo  In July without source of embolus   Carotid bilateral 40-59% stenosis, mild mixed plaques recently.    HgbA1c 6.0, troponin series negative, LDL 51 in 05/2013  SCDs for VTE prophylaxis, will consider heparin subq tomorrow after repeat CT head.  Dysphagia diet  Therapy needs:  evals pending tomorrow  Risk factor management/education  Hypertension, accelerated   BP 180/81 on admission  Home meds:  Cozaar, lopressor, imdur, lasix.   Restart home meds for BP control.  Increased cozzar to 50mg  and lopressor to 50mg  bid  BP 140-160/51-63 past 24h  SBP goal < 160  Hyperlipidemia  LDL 51   Patient on lipitor 80 daily at home, not yet resumed in hospital   Would hold off lipitor for now since it is debatable whether statin may worsening cerebral bleeding. Would consider to resume lipitor in about 5-7 days.  Hiccup - ICH related  - baclofen low dose for hiccup  Other Stroke Risk Factors   ETOH use  Hx stroke/TIA 2011   coronary artery disease with cardiac arrest, MI s/p stent placement x 2 08/2013. Placed on ASA and plavix at that time. Now off due to hemorrhage. Troponin series negative so far. Cardiology consult appreciated.  Other Pertinent History  Hx CHF post MI - resume lasix and [facility] day # 1  This patient is critically ill due to posterior circulation bleeding, MI s/p stent but off antiplatelet now, and at significant risk of neurological worsening, death form hematoma expansion, hydrocephalus, brain edema and cerebral herniation, as well as recurrent MI and cardiac arrest. This patient's care requires constant monitoring of vital signs, hemodynamics, respiratory and cardiac monitoring, review of multiple databases, neurological assessment, discussion with family, other specialists and medical decision making of high complexity. I spent 35 minutes of neurocritical care time in the care of this patient.   Rosalin Hawking, MD PhD Stroke Neurology 09/26/2013 4:35 PM  To contact Stroke Continuity provider, please refer to [email or web address] After hours, contact  General Neurology

## 2013-09-27 LAB — BASIC METABOLIC PANEL
ANION GAP: 13 (ref 5–15)
BUN: 13 mg/dL (ref 6–23)
CHLORIDE: 97 meq/L (ref 96–112)
CO2: 25 meq/L (ref 19–32)
CREATININE: 0.97 mg/dL (ref 0.50–1.35)
Calcium: 9.3 mg/dL (ref 8.4–10.5)
GFR calc Af Amer: >90 mL/min (ref >90)
GFR calc non Af Amer: 78 mL/min — ABNORMAL LOW (ref >90)
Glucose, Bld: 114 mg/dL — ABNORMAL HIGH (ref 70–99)
Potassium: 4.2 mEq/L (ref 3.7–5.3)
Sodium: 135 mEq/L — ABNORMAL LOW (ref 137–147)

## 2013-09-27 LAB — CBC
HEMATOCRIT: 30.7 % — AB (ref 39.0–52.0)
HEMOGLOBIN: 9.7 g/dL — AB (ref 13.0–17.0)
MCH: 26.6 pg (ref 26.0–34.0)
MCHC: 31.6 g/dL (ref 30.0–36.0)
MCV: 84.3 fL (ref 78.0–100.0)
Platelets: 331 10*3/uL (ref 150–400)
RBC: 3.64 MIL/uL — AB (ref 4.22–5.81)
RDW: 16.9 % — ABNORMAL HIGH (ref 11.5–15.5)
WBC: 11 10*3/uL — ABNORMAL HIGH (ref 4.0–10.5)

## 2013-09-27 MED ORDER — HYDROCODONE-ACETAMINOPHEN 5-325 MG PO TABS
1.0000 | ORAL_TABLET | ORAL | Status: DC | PRN
  Administered 2013-09-27 – 2013-09-30 (×7): 1 via ORAL
  Filled 2013-09-27 (×7): qty 1

## 2013-09-27 MED ORDER — METOPROLOL TARTRATE 25 MG PO TABS
75.0000 mg | ORAL_TABLET | Freq: Two times a day (BID) | ORAL | Status: DC
  Administered 2013-09-27: 75 mg via ORAL
  Filled 2013-09-27 (×3): qty 1

## 2013-09-27 NOTE — Progress Notes (Signed)
Subjective: Patient reports Overall is doing well with minimal to no headache minimal nausea  Objective: Vital signs in last 24 hours: Temp:  [97.9 F (36.6 C)-98.5 F (36.9 C)] 97.9 F (36.6 C) (08/22 0337) Pulse Rate:  [57-82] 67 (08/22 0730) Resp:  [0-24] 15 (08/22 0730) BP: (114-169)/(56-113) 142/113 mmHg (08/22 0730) SpO2:  [94 %-100 %] 96 % (08/22 0730)  Intake/Output from previous day: 08/21 0701 - 08/22 0700 In: 550 [P.O.:550] Out: 1100 [Urine:1100] Intake/Output this shift:    Awake alert oriented strength 5 out of 5 pupils equal reactive  Lab Results:  Recent Labs  09/26/13 0050 09/27/13 0345  WBC 9.7 11.0*  HGB 9.5* 9.7*  HCT 30.3* 30.7*  PLT 297 331   BMET  Recent Labs  09/26/13 0050 09/27/13 0345  NA 134* 135*  K 4.6 4.2  CL 96 97  CO2 25 25  GLUCOSE 119* 114*  BUN 14 13  CREATININE 1.05 0.97  CALCIUM 9.3 9.3    Studies/Results: Ct Head Wo Contrast  09/26/2013   CLINICAL DATA:  Recent intracranial hemorrhage ; currently presenting with dizziness and nausea  EXAM: CT HEAD WITHOUT CONTRAST  TECHNIQUE: Contiguous axial images were obtained from the base of the skull through the vertex without intravenous contrast.  COMPARISON:  Brain CT and brain MRI September 25, 2013  FINDINGS: The hemorrhage in the right cerebellum has increased in size, currently measuring 4.1 x 2.6 cm. Hemorrhage again is noted extending into the fourth ventricle without significant change in the ventricles superior to this hemorrhage. The fourth ventricle is somewhat compressed as was noted 1 day prior. There is hemorrhage in the atrium of the left lateral ventricle with a fluid -fluid level, slightly larger than 1 day prior.  There is mild generalized atrophy. There is moderate edema surrounding the cerebellar hemorrhage, not significantly changed. There is no new intraparenchymal focus of hemorrhage. There is no subdural or epidural fluid. There is no midline shift. There is patchy  small vessel disease in the centra semiovale bilaterally. Bony calvarium appears intact. The mastoid air cells are clear.  IMPRESSION: Enlargement of the hemorrhage in the right medial cerebellum with slightly more hemorrhage seen in the ventricular system overall. The degree of prominence of the lateral and third ventricles is stable. Fourth ventricle does remain compressed. There is atrophy with periventricular small vessel disease. No new intraparenchymal hemorrhage. The degree of edema surrounding the hemorrhage in the right cerebellum is not significantly changed.  Critical Value/emergent results were called by telephone at the time of interpretation on 09/26/2013 at 5:27 pm to Dr. Kary Kos , who verbally acknowledged these results.   Electronically Signed   By: Lowella Grip M.D.   On: 09/26/2013 17:27   Mr Jodene Nam Head Wo Contrast  09/25/2013   CLINICAL DATA:  Headache with RIGHT-sided weakness. Abnormal CT scan.  EXAM: MRI HEAD WITHOUT CONTRAST  MRA HEAD WITHOUT CONTRAST  TECHNIQUE: Multiplanar, multiecho pulse sequences of the brain and surrounding structures were obtained without intravenous contrast. Angiographic images of the head were obtained using MRA technique without contrast.  COMPARISON:  CT head earlier in the day.  FINDINGS: MRI HEAD FINDINGS  The previously identified RIGHT cerebellar hematoma has increased in size. Cross-section measurements on current images are 27 x 39 x 28 mm (R-L x A-P x C-C) as compared with 19 x 26 mm earlier today. This computes to an approximate volume of 15 mL. There is increasing surrounding edema. There is mild compression of the  fourth ventricle and displacement RIGHT to LEFT. Intraventricular extension of blood is noted in the lateral and third ventricles. There is mild hydrocephalus with enlargement of the lateral and third ventricles including temporal horns. Transependymal absorption surrounds the lateral ventricles. On gradient sequence, there is a tiny a  focus of chronic hemorrhage in the RIGHT posterior frontal subcortical white matter, not present in 2010, not clearly present on CT, likely representing chronic microbleed.  No other acute findings.  Otherwise stable chronic changes.  MRA HEAD FINDINGS  The internal carotid arteries are dolichoectatic but patent. The basilar artery is widely patent with LEFT vertebral dominant. RIGHT vertebral ends in PICA. No intracranial stenosis or aneurysm. No arteriovenous malformation or berry aneurysm is seen.  IMPRESSION: Enlarging RIGHT cerebellar hematoma, now 27 x 39 x 28 mm. Etiology likely related to combination of hypertension in the setting of anticoagulation. Increasing intraventricular hemorrhage and early hydrocephalus. A call is into the consulting neurosurgeon.   Electronically Signed   By: Rolla Flatten M.D.   On: 09/25/2013 12:40   Mr Brain Wo Contrast  09/25/2013   CLINICAL DATA:  Headache with RIGHT-sided weakness. Abnormal CT scan.  EXAM: MRI HEAD WITHOUT CONTRAST  MRA HEAD WITHOUT CONTRAST  TECHNIQUE: Multiplanar, multiecho pulse sequences of the brain and surrounding structures were obtained without intravenous contrast. Angiographic images of the head were obtained using MRA technique without contrast.  COMPARISON:  CT head earlier in the day.  FINDINGS: MRI HEAD FINDINGS  The previously identified RIGHT cerebellar hematoma has increased in size. Cross-section measurements on current images are 27 x 39 x 28 mm (R-L x A-P x C-C) as compared with 19 x 26 mm earlier today. This computes to an approximate volume of 15 mL. There is increasing surrounding edema. There is mild compression of the fourth ventricle and displacement RIGHT to LEFT. Intraventricular extension of blood is noted in the lateral and third ventricles. There is mild hydrocephalus with enlargement of the lateral and third ventricles including temporal horns. Transependymal absorption surrounds the lateral ventricles. On gradient sequence,  there is a tiny a focus of chronic hemorrhage in the RIGHT posterior frontal subcortical white matter, not present in 2010, not clearly present on CT, likely representing chronic microbleed.  No other acute findings.  Otherwise stable chronic changes.  MRA HEAD FINDINGS  The internal carotid arteries are dolichoectatic but patent. The basilar artery is widely patent with LEFT vertebral dominant. RIGHT vertebral ends in PICA. No intracranial stenosis or aneurysm. No arteriovenous malformation or berry aneurysm is seen.  IMPRESSION: Enlarging RIGHT cerebellar hematoma, now 27 x 39 x 28 mm. Etiology likely related to combination of hypertension in the setting of anticoagulation. Increasing intraventricular hemorrhage and early hydrocephalus. A call is into the consulting neurosurgeon.   Electronically Signed   By: Rolla Flatten M.D.   On: 09/25/2013 12:40    Assessment/Plan: Continued observation in the ICU repeat head CT in the morning  LOS: 2 days     Klye Besecker P 09/27/2013, 8:06 AM

## 2013-09-27 NOTE — Progress Notes (Addendum)
STROKE TEAM PROGRESS NOTE   HISTORY Taylor Keller is a 78 y.o. male with a history of cardiac arrest in July who presents with headache that started earlier tonight. He states that it started around 9:30 pm 09/24/2013 and has been getting worse. In the ED, he was noted to have right sided weakness as well.  Of note, he had a vfib arrest in July following MI and has been recovering from this. He had two DES placed at that time to the LAD and left circumflex arteries.  Patient was not administered TPA secondary to hemorrhage. He was admitted to the neuro ICU for further evaluation and treatment.  SUBJECTIVE (INTERVAL HISTORY) His wife is at the bedside.  Overall he feels his condition is stable. He still awake alert following commands. Overnight had one episode of N/V. Hiccup occasionally. Repeat CT showed some enlargement of right cerebellar bleeding but no significant hydrocephalus. He reported feeling better.  OBJECTIVE Temp:  [97.4 F (36.3 C)-98.5 F (36.9 C)] 97.4 F (36.3 C) (08/22 0829) Pulse Rate:  [57-82] 71 (08/22 1100) Resp:  [0-24] 9 (08/22 1100) BP: (114-169)/(56-113) 149/66 mmHg (08/22 1100) SpO2:  [91 %-100 %] 93 % (08/22 1100)  BMET    Component Value Date/Time   NA 135* 09/27/2013 0345   K 4.2 09/27/2013 0345   CL 97 09/27/2013 0345   CO2 25 09/27/2013 0345   GLUCOSE 114* 09/27/2013 0345   BUN 13 09/27/2013 0345   CREATININE 0.97 09/27/2013 0345   CREATININE 1.12 09/19/2013 1524   CALCIUM 9.3 09/27/2013 0345   GFRNONAA 78* 09/27/2013 0345   GFRNONAA 63 09/19/2013 1524   GFRAA >90 09/27/2013 0345   GFRAA 73 09/19/2013 1524   CBC    Component Value Date/Time   WBC 11.0* 09/27/2013 0345   RBC 3.64* 09/27/2013 0345   HGB 9.7* 09/27/2013 0345   HCT 30.7* 09/27/2013 0345   PLT 331 09/27/2013 0345   MCV 84.3 09/27/2013 0345   MCH 26.6 09/27/2013 0345   MCHC 31.6 09/27/2013 0345   RDW 16.9* 09/27/2013 0345   LYMPHSABS 0.8 09/25/2013 0045   MONOABS 0.7 09/25/2013 0045   EOSABS  0.1 09/25/2013 0045   BASOSABS 0.0 09/25/2013 0045   Lipid Panel     Component Value Date/Time   CHOL 99 05/13/2013 0833   TRIG 48 05/13/2013 0833   HDL 38* 05/13/2013 0833   CHOLHDL 2.6 05/13/2013 0833   VLDL 10 05/13/2013 0833   LDLCALC 51 05/13/2013 0833   Drugs of Abuse     Component Value Date/Time   LABOPIA NONE DETECTED 09/25/2013 0312   LABOPIA NEGATIVE 08/18/2008 0400   COCAINSCRNUR NONE DETECTED 09/25/2013 0312   COCAINSCRNUR NEGATIVE 08/18/2008 0400   LABBENZ NONE DETECTED 09/25/2013 0312   LABBENZ NEGATIVE 08/18/2008 0400   AMPHETMU NONE DETECTED 09/25/2013 0312   AMPHETMU NEGATIVE 08/18/2008 0400   THCU NONE DETECTED 09/25/2013 0312   LABBARB NONE DETECTED 09/25/2013 0312     PHYSICAL EXAM Physical exam  Temp:  [97.4 F (36.3 C)-98.5 F (36.9 C)] 97.4 F (36.3 C) (08/22 0829) Pulse Rate:  [57-82] 71 (08/22 1100) Resp:  [0-24] 9 (08/22 1100) BP: (114-169)/(56-113) 149/66 mmHg (08/22 1100) SpO2:  [91 %-100 %] 93 % (08/22 1100)  General - Well nourished, well developed, in no apparent distress.  Ophthalmologic - not able to see through.  Cardiovascular - Regular rate and rhythm with no murmur.  Mental Status -  Level of arousal and orientation to time, place, and  person were intact. Language including expression, naming, repetition, comprehension was assessed and found intact.  Cranial Nerves II - XII - II - Visual field intact OU. III, IV, VI - Extraocular movements intact, but had left eye gaze preference, horizontal nystagumus on right gaze. V - Facial sensation intact bilaterally. VII - Facial movement intact bilaterally. VIII - Hearing & vestibular intact bilaterally. X - Palate elevates symmetrically. XI - Chin turning & shoulder shrug intact bilaterally. XII - Tongue protrusion intact.  Motor Strength - The patient's strength was normal in all extremities and pronator drift was absent.  Bulk was normal and fasciculations were absent.   Motor Tone - Muscle tone was  assessed at the neck and appendages and was normal.  Reflexes - The patient's reflexes were normal in all extremities and he had no pathological reflexes.  Sensory - Light touch, temperature/pinprick were assessed and were normal.    Coordination - The patient had dysmetria on the right hand, no obvious ataxia.  Tremor was absent.  Gait and Station - not tested due to BP concern.   ASSESSMENT/PLAN  Mr. Taylor Keller is a 78 y.o. male with hx of vfib cardiac arrest, MI with stent placement in July of this year presenting with headache and right sided weakness.  Imaging confirms a right cerebellar hemorrhage infarct. Volume:  15 cc. Stroke work up underway. Antiplatelet discontinued. Will have cardiology consult. NSG on board. MRI showed slightly enlargement of hematoma but MRI tends to over estimate bleeding comparing with CT. No significant hydrocephalus comparing with CT overnight. Repeat CT yesterday showed some enlargement of cerebellar hematoma but no hydrocephalus. He is neurologically intact, will not consider surgical intervention. NSG is following.   Stroke:  right cerebellar hemorrhage secondary to hypertension likely     Continue ICU level care as per NSG  OOB   MRI brain showed slightly enlargement of hematoma but MRI tends to over estimate bleeding comparing with CT. No significant hydrocephalus comparing with CT overnight.  Repeat CT yesterday showed some enlargement of cerebellar hematoma but no hydrocephalus.  Repeat CT tomorrow as per NSG  aspirin 81 mg orally every day and clopidogrel 75 mg orally every day prior to admission due to recent stent, now off due to hemorrhage.  Agree with cardiology, would consider to resume plavix alone in about 2 weeks if bleeding is stable  Would hold off lipitor for now since it is debatable whether statin may worsening cerebral bleeding. Would consider to resume lipitor in about 5-7 days.  Cardiology input appreciated.  NSG  is on board and following. Currently no surgical intervention. Will close monitoring.   2D Echo  In July without source of embolus   Carotid bilateral 40-59% stenosis, mild mixed plaques recently.   HgbA1c 6.0, troponin series negative, LDL 51 in 05/2013  SCDs for VTE prophylaxis, will consider heparin subq tomorrow after repeat CT head.  Dysphagia diet  Therapy needs: CIR  Risk factor management/education  Hypertension, accelerated   BP 180/81 on admission  Home meds:  Cozaar, lopressor, imdur, lasix.   Restart home meds for BP control.  Continue cozzar at 50mg  and Increased  lopressor to 75mg  bid  BP 140-160/51-63 past 24h  SBP goal < 160  Hyperlipidemia  LDL 51   Patient on lipitor 80 daily at home, not yet resumed in hospital   Would hold off lipitor for now since it is debatable whether statin may worsening cerebral bleeding. Would consider to resume lipitor in about 5-7  days.  Hiccup - ICH related  - baclofen low dose for hiccup  Other Stroke Risk Factors   ETOH use    Hx stroke/TIA 2011   coronary artery disease with cardiac arrest, MI s/p stent placement x 2 08/2013. Placed on ASA and plavix at that time. Now off due to hemorrhage. Troponin series negative so far. Pt denies CP, SOB. Cardiology consult appreciated.  Other Pertinent History  Hx CHF post MI - resume lasix and [facility] day # 2  Rosalin Hawking, MD PhD Stroke Neurology 09/27/2013 12:27 PM   To contact Stroke Continuity provider, please refer to [email or web address] After hours, contact General Neurology

## 2013-09-27 NOTE — Progress Notes (Signed)
Patient ID: Taylor Keller, male   DOB: 09/24/1935, 78 y.o.   MRN: [id number]  Cardiologist: Dr. Meda Coffee  Subjective:  Feeling better.Headache less no chest pain   Daughter concerned about timely f/u as outpatient Was supposed to see Dr Meda Coffee Tuesday  Will need to reschedule this  Objective:  Vital Signs in the last 24 hours: Temp:  [97.4 F (36.3 C)-98.5 F (36.9 C)] 97.4 F (36.3 C) (08/22 0829) Pulse Rate:  [57-82] 71 (08/22 0800) Resp:  [0-24] 18 (08/22 0800) BP: (114-169)/(56-113) 162/63 mmHg (08/22 0800) SpO2:  [94 %-100 %] 96 % (08/22 0800)  Intake/Output from previous day: 08/21 0701 - 08/22 0700 In: 550 [P.O.:550] Out: 1100 [Urine:1100]   Physical Exam: General: Well developed, well nourished, in no acute distress. Head:  Normocephalic and atraumatic. Was wearing eye patch. Lungs: Clear to auscultation and percussion. Heart: Normal S1 and S2.  No murmur, rubs or gallops.   Lab Results:  Recent Labs  09/26/13 0050 09/27/13 0345  WBC 9.7 11.0*  HGB 9.5* 9.7*  PLT 297 331    Recent Labs  09/26/13 0050 09/27/13 0345  NA 134* 135*  K 4.6 4.2  CL 96 97  CO2 25 25  GLUCOSE 119* 114*  BUN 14 13  CREATININE 1.05 0.97    Recent Labs  09/25/13 1645 09/26/13 0050  TROPONINI <0.30 <0.30   Hepatic Function Panel  Recent Labs  09/25/13 0045  PROT 7.8  ALBUMIN 2.7*  AST 17  ALT 20  ALKPHOS 80  BILITOT 0.2*    Imaging:  Personally viewed.   Assessment/Plan:  Active Problems:   ICH (intracerebral hemorrhage)   78 year old with DES placed 08/25/13 following cardiac arrest [facility]) normal EF on Plavix and ASA with ICH.   1) CAD/ICH - resume Plavix when comfortable with neuro team. Wife and he understand risk of stent thrombosis. Repeat CT in am   He has improved from clinical standpoint per nursing. Some nystagmus.   Reviewed neuro notes.   2) HTN -  resumed home BP meds.ARB and beta blocker increased   Labile with CNS bleed  but improved   Will follow.     Jenkins Rouge 09/27/2013, 9:12 AM

## 2013-09-28 ENCOUNTER — Inpatient Hospital Stay (HOSPITAL_COMMUNITY): Payer: Medicare HMO

## 2013-09-28 DIAGNOSIS — G911 Obstructive hydrocephalus: Secondary | ICD-10-CM

## 2013-09-28 LAB — BASIC METABOLIC PANEL
ANION GAP: 12 (ref 5–15)
BUN: 14 mg/dL (ref 6–23)
CO2: 27 mEq/L (ref 19–32)
Calcium: 9.2 mg/dL (ref 8.4–10.5)
Chloride: 95 mEq/L — ABNORMAL LOW (ref 96–112)
Creatinine, Ser: 0.99 mg/dL (ref 0.50–1.35)
GFR, EST AFRICAN AMERICAN: 90 mL/min — AB (ref >90)
GFR, EST NON AFRICAN AMERICAN: 78 mL/min — AB (ref >90)
GLUCOSE: 110 mg/dL — AB (ref 70–99)
POTASSIUM: 4.2 meq/L (ref 3.7–5.3)
SODIUM: 134 meq/L — AB (ref 137–147)

## 2013-09-28 LAB — URINALYSIS W MICROSCOPIC (NOT AT ARMC)
Bilirubin Urine: NEGATIVE
Glucose, UA: NEGATIVE mg/dL
Hgb urine dipstick: NEGATIVE
KETONES UR: NEGATIVE mg/dL
Leukocytes, UA: NEGATIVE
NITRITE: POSITIVE — AB
PH: 6.5 (ref 5.0–8.0)
PROTEIN: NEGATIVE mg/dL
Specific Gravity, Urine: 1.015 (ref 1.005–1.030)
Urobilinogen, UA: 0.2 mg/dL (ref 0.0–1.0)

## 2013-09-28 LAB — CBC
HCT: 33.3 % — ABNORMAL LOW (ref 39.0–52.0)
HEMOGLOBIN: 10.5 g/dL — AB (ref 13.0–17.0)
MCH: 26.7 pg (ref 26.0–34.0)
MCHC: 31.5 g/dL (ref 30.0–36.0)
MCV: 84.7 fL (ref 78.0–100.0)
Platelets: 380 10*3/uL (ref 150–400)
RBC: 3.93 MIL/uL — ABNORMAL LOW (ref 4.22–5.81)
RDW: 16.9 % — ABNORMAL HIGH (ref 11.5–15.5)
WBC: 14.9 10*3/uL — ABNORMAL HIGH (ref 4.0–10.5)

## 2013-09-28 LAB — PLATELET INHIBITION P2Y12: PLATELET FUNCTION P2Y12: 281 [PRU] (ref 194–418)

## 2013-09-28 MED ORDER — ASPIRIN EC 81 MG PO TBEC
81.0000 mg | DELAYED_RELEASE_TABLET | Freq: Every day | ORAL | Status: DC
  Administered 2013-09-28 – 2013-09-30 (×3): 81 mg via ORAL
  Filled 2013-09-28 (×3): qty 1

## 2013-09-28 MED ORDER — SULFAMETHOXAZOLE-TRIMETHOPRIM 400-80 MG/5ML IV SOLN
400.0000 mg | Freq: Two times a day (BID) | INTRAVENOUS | Status: DC
  Administered 2013-09-28 – 2013-09-30 (×4): 400 mg via INTRAVENOUS
  Filled 2013-09-28 (×6): qty 25

## 2013-09-28 MED ORDER — HEPARIN SODIUM (PORCINE) 5000 UNIT/ML IJ SOLN
5000.0000 [IU] | Freq: Three times a day (TID) | INTRAMUSCULAR | Status: DC
  Administered 2013-09-28 – 2013-09-30 (×8): 5000 [IU] via SUBCUTANEOUS
  Filled 2013-09-28 (×9): qty 1

## 2013-09-28 MED ORDER — METOPROLOL TARTRATE 100 MG PO TABS
100.0000 mg | ORAL_TABLET | Freq: Two times a day (BID) | ORAL | Status: DC
  Administered 2013-09-28 – 2013-09-30 (×5): 100 mg via ORAL
  Filled 2013-09-28 (×2): qty 2
  Filled 2013-09-28 (×4): qty 1

## 2013-09-28 NOTE — Plan of Care (Signed)
Problem: Progression Outcomes Goal: Rehab Team goals identified Outcome: Progressing Rehab to see patient on 8/24

## 2013-09-28 NOTE — Progress Notes (Addendum)
09/28/13 0346  What Happened  Was fall witnessed? No  Was patient injured? Yes  Patient found on floor  Found by Staff-comment  Stated prior activity Ambulating without assistance  Follow Up  MD notified Dr Leonel Ramsay  Time MD notified [phone]  Family notified Yes-comment Manuela Schwartz- wife)  Time family notified 0345  Additional tests Expedite previously scheduled CT scan  Progress note created (see row info) Yes  Adult Fall Risk Assessment  Risk Factor Category (scoring not indicated) High fall risk per protocol (document High fall risk)  Adult Fall Risk Interventions  Required Bundle Interventions *See Row Information* High fall risk - low, moderate, and high requirements implemented  Additional Interventions Fall risk signage;Secure all tubes/drains;Room near nurses station  Vitals  Temp 97.8 F (36.6 C)  Temp src Oral  BP ! 172/74 mmHg  MAP (mmHg) 99  Patient Position (if appropriate) Lying  Pulse Rate 72  Pulse Rate Source Monitor  ECG Heart Rate 71  Cardiac Rhythm NSR  Resp 15  Oxygen Therapy  SpO2 96 %  O2 Device None (Room air)  Pain Assessment  Pain Assessment No/denies pain  Neurological  Neuro (WDL) WDL  Level of Consciousness Alert  Orientation Level Oriented to person;Disoriented to place;Disoriented to time;Disoriented to situation  Armed forces training and education officer Clear  Pupil Assessment  Yes  R Pupil Size (mm) 5  R Pupil Shape Irregular  R Pupil Reaction Brisk  L Pupil Size (mm) 4  L Pupil Shape Round  L Pupil Reaction Brisk  Additional Pupil Assessments No  Motor Function/Sensation Assessment Grip;Motor response;Motor strength;Dorsiflexion;Plantar flexion  Facial Symmetry Symmetrical  R Hand Grip Moderate  L Hand Grip Moderate   Right Pronator Drift Absent  Left Pronator Drift Absent  R Foot Dorsiflexion Moderate  L Foot Dorsiflexion Moderate  R Foot Plantar Flexion Moderate  L Foot Plantar Flexion Moderate  RUE Motor Response Purposeful  movement;Responds to commands;Other (Comment)  RUE Motor Strength 5  LUE Motor Response Purposeful movement;Responds to commands  LUE Motor Strength 5  RLE Motor Response Purposeful movement;Responds to commands  RLE Motor Strength 4  LLE Motor Response Purposeful movement  LLE Motor Strength 4  Neuro Symptoms None  Glasgow Coma Scale  Eye Opening 4  Best Verbal Response (NON-intubated) 5  Best Motor Response 6  Glasgow Coma Scale Score 15

## 2013-09-28 NOTE — Progress Notes (Signed)
Patient ID: Taylor Keller, male   DOB: Jan 07, 1936, 78 y.o.   MRN: [id number]  Cardiologist: Dr. Meda Coffee  Subjective:  Still with left occipital headache  Objective:  Vital Signs in the last 24 hours: Temp:  [97.8 F (36.6 C)-98.6 F (37 C)] 98.1 F (36.7 C) (08/23 0807) Pulse Rate:  [62-81] 66 (08/23 0700) Resp:  [9-26] 12 (08/23 0700) BP: (114-182)/(49-91) 160/62 mmHg (08/23 0700) SpO2:  [85 %-99 %] 95 % (08/23 0700)  Intake/Output from previous day: 08/22 0701 - 08/23 0700 In: 440 [P.O.:440] Out: 500 [Urine:500]   Physical Exam: General: Well developed, well nourished, in no acute distress. Head:  Normocephalic and atraumatic. Was wearing eye patch. Lungs: Clear to auscultation and percussion. Heart: Normal S1 and S2.  Soft SEM  murmur, rubs or gallops.   Lab Results:  Recent Labs  09/27/13 0345 09/28/13 0225  WBC 11.0* 14.9*  HGB 9.7* 10.5*  PLT 331 380    Recent Labs  09/27/13 0345 09/28/13 0225  NA 135* 134*  K 4.2 4.2  CL 97 95*  CO2 25 27  GLUCOSE 114* 110*  BUN 13 14  CREATININE 0.97 0.99    Recent Labs  09/25/13 1645 09/26/13 0050  TROPONINI <0.30 <0.30    Imaging:  Personally viewed.   Assessment/Plan:  Active Problems:   ICH (intracerebral hemorrhage)   78 year old with DES placed 08/25/13 following cardiac arrest [facility]) normal EF on Plavix and ASA with ICH.   1) CAD/ICH - resume Plavix when comfortable with neuro team. ASA restarted today  Ct with some shrinkage of bleed but still impressive Will check P2Y to see where plavix effect stands  Reviewed neuro notes.   2) HTN -  resumed home BP meds.ARB and beta blocker increased   Improved    Will follow.     Jenkins Rouge 09/28/2013, 11:22 AM

## 2013-09-28 NOTE — Progress Notes (Signed)
Responded to room within 3 seconds of bed alarm sounding. Pt found on floor on hands and knees. Client supported in sitting position until additional RNs arrived. Patient stated that he was at home and was trying to get up and get ready. Patient did not remember being in hospital currently but responded yes when asked if he remembered having a stroke. Patient stated he was in no pain and was assisted back to standing and then to bed. A small abrasion with some surrounding swelling was noted above the right eye.  Dr. Leonel Ramsay was paged and informed of change in orientation level as well as fall. Patient's wife was also notified of fall  and change in patient status.

## 2013-09-28 NOTE — Progress Notes (Addendum)
STROKE TEAM PROGRESS NOTE   HISTORY Taylor Keller is a 78 y.o. male with a history of cardiac arrest in July who presents with headache that started earlier tonight. Taylor Keller states that it started around 9:30 pm 09/24/2013 and has been getting worse. In the ED, Taylor Keller was noted to have right sided weakness as well.  Of note, Taylor Keller had a vfib arrest in July following MI and has been recovering from this. Taylor Keller had two DES placed at that time to the LAD and left circumflex arteries.  Patient was not administered TPA secondary to hemorrhage. Taylor Keller was admitted to the neuro ICU for further evaluation and treatment.  SUBJECTIVE (INTERVAL HISTORY) Taylor Keller Taylor Keller is at the bedside.  Taylor Keller had a fall last night around 3:45am. As per nurse, Taylor Keller got up from bed and fall to the floor. Taylor Keller stated that Taylor Keller thought it was morning hours and Taylor Keller needs to get up. Taylor Keller had CT head stat which did not show new bleeding, some decreased of ICH and IVH volume but concerning for developing hydrocephalus. This morning, pt was drowsy sleepy and not fully orientated. Taylor Keller is concerned.  OBJECTIVE Temp:  [97.5 F (36.4 C)-98.6 F (37 C)] 97.5 F (36.4 C) (08/23 1205) Pulse Rate:  [62-81] 65 (08/23 1100) Resp:  [9-26] 13 (08/23 1100) BP: (114-182)/(49-91) 124/66 mmHg (08/23 1100) SpO2:  [85 %-99 %] 96 % (08/23 1100)  BMET    Component Value Date/Time   NA 134* 09/28/2013 0225   K 4.2 09/28/2013 0225   CL 95* 09/28/2013 0225   CO2 27 09/28/2013 0225   GLUCOSE 110* 09/28/2013 0225   BUN 14 09/28/2013 0225   CREATININE 0.99 09/28/2013 0225   CREATININE 1.12 09/19/2013 1524   CALCIUM 9.2 09/28/2013 0225   GFRNONAA 78* 09/28/2013 0225   GFRNONAA 63 09/19/2013 1524   GFRAA 90* 09/28/2013 0225   GFRAA 73 09/19/2013 1524   CBC    Component Value Date/Time   WBC 14.9* 09/28/2013 0225   RBC 3.93* 09/28/2013 0225   HGB 10.5* 09/28/2013 0225   HCT 33.3* 09/28/2013 0225   PLT 380 09/28/2013 0225   MCV 84.7 09/28/2013 0225   MCH 26.7 09/28/2013 0225   MCHC  31.5 09/28/2013 0225   RDW 16.9* 09/28/2013 0225   LYMPHSABS 0.8 09/25/2013 0045   MONOABS 0.7 09/25/2013 0045   EOSABS 0.1 09/25/2013 0045   BASOSABS 0.0 09/25/2013 0045   Lipid Panel     Component Value Date/Time   CHOL 99 05/13/2013 0833   TRIG 48 05/13/2013 0833   HDL 38* 05/13/2013 0833   CHOLHDL 2.6 05/13/2013 0833   VLDL 10 05/13/2013 0833   LDLCALC 51 05/13/2013 0833   Drugs of Abuse     Component Value Date/Time   LABOPIA NONE DETECTED 09/25/2013 0312   LABOPIA NEGATIVE 08/18/2008 0400   COCAINSCRNUR NONE DETECTED 09/25/2013 0312   COCAINSCRNUR NEGATIVE 08/18/2008 0400   LABBENZ NONE DETECTED 09/25/2013 0312   LABBENZ NEGATIVE 08/18/2008 0400   AMPHETMU NONE DETECTED 09/25/2013 0312   AMPHETMU NEGATIVE 08/18/2008 0400   THCU NONE DETECTED 09/25/2013 0312   LABBARB NONE DETECTED 09/25/2013 0312     PHYSICAL EXAM Physical exam  Temp:  [97.5 F (36.4 C)-98.6 F (37 C)] 97.5 F (36.4 C) (08/23 1205) Pulse Rate:  [62-81] 65 (08/23 1100) Resp:  [9-26] 13 (08/23 1100) BP: (114-182)/(49-91) 124/66 mmHg (08/23 1100) SpO2:  [85 %-99 %] 96 % (08/23 1100)  General - Well nourished, well developed, in no  apparent distress.  Ophthalmologic - not able to see through.  Cardiovascular - Regular rate and rhythm with no murmur.  Mental Status -  Sleepy and lethargic, knows in hospital but did not know hospital name, not orientated to time and people, worse than yesterday. Language including expression, naming, repetition, comprehension was assessed and found intact, but slower than before.  Cranial Nerves II - XII - II - Visual field intact OU. III, IV, VI - Extraocular movements intact, but had left eye gaze preference, horizontal nystagumus on right gaze. V - Facial sensation intact bilaterally. VII - Facial movement intact bilaterally. VIII - Hearing & vestibular intact bilaterally. X - Palate elevates symmetrically. XI - Chin turning & shoulder shrug intact bilaterally. XII - Tongue  protrusion intact.  Motor Strength - The patient's strength was normal in all extremities and pronator drift was absent.  Bulk was normal and fasciculations were absent.   Motor Tone - Muscle tone was assessed at the neck and appendages and was normal.  Reflexes - The patient's reflexes were normal in all extremities and Taylor Keller had no pathological reflexes.  Sensory - Light touch, temperature/pinprick were assessed and were normal.    Coordination - The patient had dysmetria on the right hand, no obvious ataxia.  Tremor was absent.  Gait and Station - not tested due to BP concern.   ASSESSMENT/PLAN  Mr. PHILLIPPE ORLICK is a 78 y.o. male with hx of vfib cardiac arrest, MI with stent placement in July of this year presenting with headache and right sided weakness.  Imaging confirms a right cerebellar hemorrhage infarct. Volume:  15 cc. Stroke work up underway. Antiplatelet discontinued. Cardiology on board. NSG on board. MRI 8/20 showed slightly enlargement of hematoma but not significant. No significant hydrocephalus comparing with CT 8/20. Repeat CT 8/21 showed some enlargement of cerebellar hematoma but no hydrocephalus. today CT showed decreased size of ICH and IVH but concerning for developing hydrocephalus. Taylor Keller had fall this am, seems had mental status change and delirium. Today more lethargic and not fully orientated, concerning for hydrocephalus. Will close monitor and if necessary, repeat CT in pm and EVD if needed.  Stroke:  right cerebellar hemorrhage secondary to hypertension likely     Continue ICU level care as per NSG  today CT showed decreased size of ICH and IVH but concerning for developing hydrocephalus. Taylor Keller had fall this am, seems had mental status change and delirium. Today more lethargic and not fully orientated, concerning for hydrocephalus. Will close monitor and if necessary, repeat CT in pm and EVD if needed.   MRI brain 8/20 showed slightly enlargement of hematoma but no  significant hydrocephalus comparing with CT overnight.  Repeat CT 8/21 showed some enlargement of cerebellar hematoma but no hydrocephalus.  Today am CT showed decreased size of ICH and IVH but concerning for developing hydrocephalus.   aspirin 81 mg orally every day and clopidogrel 75 mg orally every day prior to admission due to recent stent, now start ASA 81mg  today as per NSG.  Agree with cardiology, would consider to resume plavix alone in about 2 weeks if bleeding is stable  Would hold off lipitor for now since it is debatable whether statin may worsening cerebral bleeding. Would consider to resume lipitor in about 7 days.  Cardiology input appreciated.  NSG is on board and following. Currently no surgical intervention. Will close monitoring. Consider EVD if hydrocephalus build up.  2D Echo  In July without source of embolus  Carotid bilateral 40-59% stenosis, mild mixed plaques recently.   HgbA1c 6.0, troponin series negative, LDL 51 in 05/2013  Start heparin subq for DVT prophylaxis today.  Dysphagia diet  Therapy needs: CIR  Risk factor management/education  Leukocytosis  - WBC 14.9 today from 11.0 yesterday - UA was negative on 8/20, on condom catheter, will repeat UA today. - daily CBC  Hypertension, accelerated   BP 180/81 on admission  Home meds:  Cozaar, lopressor, imdur, lasix.   Restart home meds for BP control.  Continue cozzar at 50mg  and Increased  lopressor to 100 mg bid  BP 114-182 past 24h  SBP goal < 160  Hyperlipidemia  LDL 51   Patient on lipitor 80 daily at home, not yet resumed in hospital   Would hold off lipitor for now since it is debatable whether statin may worsening cerebral bleeding. Would consider to resume lipitor in about 7 days.  Hiccup - ICH related  - baclofen low dose for hiccup  Other Stroke Risk Factors   ETOH use    Hx stroke/TIA 2011   coronary artery disease with cardiac arrest, MI s/p stent placement x 2  08/2013. Placed on ASA and plavix at that time. Now off due to hemorrhage. Troponin series negative so far. Pt denies CP, SOB. Cardiology consult appreciated.  Other Pertinent History  Hx CHF post MI - resume lasix and [facility] day # 3  This patient is critically ill due to cerebellar ICH and extending to ventricles (IVH) and at significant risk of neurological worsening, death form hydrocephalus, recurrent bleeding, cerebral edema and herniation. This patient's care requires constant monitoring of vital signs, hemodynamics, respiratory and cardiac monitoring, review of multiple databases, neurological assessment, discussion with family, other specialists and medical decision making of high complexity. I spent 50 minutes of neurocritical care time in the care of this patient.   Rosalin Hawking, MD PhD Stroke Neurology 09/28/2013 12:45 PM   To contact Stroke Continuity provider, please refer to [email or web address] After hours, contact General Neurology

## 2013-09-28 NOTE — Progress Notes (Signed)
Patient ID: Taylor Keller, male   DOB: August 13, 1935, 78 y.o.   MRN: [id number] Patient doing well as morning slightly decreased headache no nausea  Awake alert oriented strength out of 5 no pronator drift  CT slight decrease in cerebellar hemorrhage stable ventricular megaly  Start aspirin continue to observe in the ICU

## 2013-09-28 NOTE — Progress Notes (Signed)
Re-evaluate pt in the afternoon. He was sitting in bed watching golf. He is more awake alert and smile to me when I came in. He was fully orientated and intact on language and attention and concentration as well as fund of knowledge. Clinically neuro intact. No need to repeat CT head. Will continue to monitor.  Rosalin Hawking, MD PhD Stroke Neurology 09/28/2013 2:48 PM

## 2013-09-29 ENCOUNTER — Telehealth: Payer: Self-pay | Admitting: *Deleted

## 2013-09-29 DIAGNOSIS — I619 Nontraumatic intracerebral hemorrhage, unspecified: Secondary | ICD-10-CM

## 2013-09-29 LAB — BASIC METABOLIC PANEL
Anion gap: 11 (ref 5–15)
BUN: 14 mg/dL (ref 6–23)
CALCIUM: 9.3 mg/dL (ref 8.4–10.5)
CO2: 27 mEq/L (ref 19–32)
CREATININE: 0.95 mg/dL (ref 0.50–1.35)
Chloride: 96 mEq/L (ref 96–112)
GFR calc non Af Amer: 79 mL/min — ABNORMAL LOW (ref >90)
Glucose, Bld: 95 mg/dL (ref 70–99)
Potassium: 4.1 mEq/L (ref 3.7–5.3)
Sodium: 134 mEq/L — ABNORMAL LOW (ref 137–147)

## 2013-09-29 LAB — CBC
HCT: 31 % — ABNORMAL LOW (ref 39.0–52.0)
Hemoglobin: 9.6 g/dL — ABNORMAL LOW (ref 13.0–17.0)
MCH: 26.3 pg (ref 26.0–34.0)
MCHC: 31 g/dL (ref 30.0–36.0)
MCV: 84.9 fL (ref 78.0–100.0)
Platelets: 389 10*3/uL (ref 150–400)
RBC: 3.65 MIL/uL — ABNORMAL LOW (ref 4.22–5.81)
RDW: 17.1 % — AB (ref 11.5–15.5)
WBC: 8.7 10*3/uL (ref 4.0–10.5)

## 2013-09-29 NOTE — Consult Note (Signed)
Physical Medicine and Rehabilitation Consult Reason for Consult: Cerebellar ICH Referring Physician: Dr.Xu   HPI: Taylor Keller is a 78 y.o. right-handed male with history of CAD with cardiac arrest July 2015 and maintained on Plavix. CVA 2011 with little residual weakness. Independent prior to admission living with his wife. Presented 09/25/2013 with headache, nausea and vomiting as well as right-sided weakness. CT/MRI of the brain showed a 2.6 x 1.9 cm hematoma in the inferior right cerebellum. Neurosurgery consulted advise conservative care. Noted fall 09/27/2013 while in the hospital as patient attempted to get out of bed unassisted. A followup cranial CT scan completed showing interval decrease in size of right cerebellar hemorrhage with slightly increased ventriculomegaly  involving the lateral and third ventricles. Maintained on a dysphagia 2 nectar thick liquid diet. Subcutaneous heparin initiated for DVT prophylaxis 09/28/2013. Physical therapy evaluation completed a 21 2015 with recommendations of physical medicine rehabilitation consult.   Review of Systems  Cardiovascular: Positive for chest pain and palpitations.  Musculoskeletal: Positive for back pain.  Psychiatric/Behavioral: The patient has insomnia.   All other systems reviewed and are negative.  Past Medical History  Diagnosis Date  . Lumbar stenosis     L5  . Hypothyroidism   . Hyperlipidemia   . Hypertension   . ED (erectile dysfunction)   . Internal carotid artery stenosis 5/15    bilateral, 40-59%  . Myocardial infarction 08/2013  . Heart murmur   . Stroke ~ 2011    "slight; no permanent damage"   Past Surgical History  Procedure Laterality Date  . Tonsillectomy and adenoidectomy    . Appendectomy    . Coronary angioplasty with stent placement  08/2013    "2"  . Cataract extraction, bilateral Bilateral ~ 2012   Family History  Problem Relation Age of Onset  . Colon cancer Sister     colon    Social History:  reports that he has never smoked. He has never used smokeless tobacco. He reports that he drinks alcohol. He reports that he does not use illicit drugs. Allergies:  Allergies  Allergen Reactions  . Benazepril Cough   Medications Prior to Admission  Medication Sig Dispense Refill  . acetaminophen (TYLENOL) 325 MG tablet Take 2 tablets (650 mg total) by mouth every 4 (four) hours as needed for headache or mild pain.      Marland Kitchen albuterol (PROVENTIL) (5 MG/ML) 0.5% nebulizer solution Take 2.5 mg by nebulization every 6 (six) hours as needed for wheezing or shortness of breath.      Marland Kitchen aspirin 81 MG chewable tablet Chew 81 mg by mouth daily.      Marland Kitchen atorvastatin (LIPITOR) 80 MG tablet Take 80 mg by mouth every evening.      . clopidogrel (PLAVIX) 75 MG tablet Take 75 mg by mouth daily.      . diphenhydrAMINE (BENADRYL) 25 MG tablet Take 25 mg by mouth every 6 (six) hours as needed.      . furosemide (LASIX) 40 MG tablet Take 1 tablet (40 mg total) by mouth daily.  30 tablet  11  . ipratropium (ATROVENT) 0.02 % nebulizer solution Take 0.5 mg by nebulization every 6 (six) hours as needed for wheezing or shortness of breath.      . isosorbide mononitrate (IMDUR) 60 MG 24 hr tablet Take 60 mg by mouth daily.      . lansoprazole (PREVACID SOLUTAB) 30 MG disintegrating tablet Take 30 mg by mouth daily.      Marland Kitchen  levothyroxine (SYNTHROID, LEVOTHROID) 175 MCG tablet Take 175 mcg by mouth daily.      Marland Kitchen losartan (COZAAR) 25 MG tablet Take 1 tablet (25 mg total) by mouth daily.  30 tablet  11  . metoprolol (LOPRESSOR) 50 MG tablet Take 50 mg by mouth 2 (two) times daily.      . nitroGLYCERIN (NITROSTAT) 0.4 MG SL tablet Place 1 tablet (0.4 mg total) under the tongue every 5 (five) minutes as needed for chest pain.  25 tablet  prn  . zolpidem (AMBIEN) 5 MG tablet Take 1 tablet (5 mg total) by mouth at bedtime as needed for sleep.  30 tablet  1    Home: Home Living Family/patient expects to be  discharged to:: Private residence Living Arrangements: Spouse/significant other Available Help at Discharge: Family;Available 24 hours/day Type of Home: House Home Access: Stairs to enter [employer] of Steps: 4 Entrance Stairs-Rails: Left Home Layout: Multi-level;Able to live on main level with bedroom/bathroom Home Equipment: Gilford Rile - 2 wheels;Bedside commode  Lives With: Spouse  Functional History: Prior Function Level of Independence: Independent Comments: retired Tax adviser Status:  Mobility: Bed Mobility Overal bed mobility: Needs Assistance Bed Mobility: Supine to Sit Supine to sit: Mod assist General bed mobility comments: Increased time to perform, assist for stability and positioning, assist for elevation to upright and hip rotation to EOB Transfers Overall transfer level: Needs assistance Equipment used: 2 person hand held assist Transfers: Sit to/from Stand Sit to Stand: Mod assist;+2 physical assistance;+2 safety/equipment Stand pivot transfers: Mod assist;+2 physical assistance General transfer comment: ataxic gait pattern/trunkal ataxia Ambulation/Gait Ambulation/Gait assistance: Mod assist;+2 physical assistance Ambulation Distance (Feet): 8 Feet Gait Pattern/deviations: Step-through pattern;Steppage;Ataxic;Staggering left;Staggering right;Narrow base of support Gait velocity: decreased Gait velocity interpretation: Below normal speed for age/gender General Gait Details: Patient very unsteady, steps very effortful and uncoordinated. Inability to control body positioning during upright requires significant assist with posterior bias.    ADL: ADL Overall ADL's : Needs assistance/impaired Eating/Feeding: Minimal assistance;Sitting Eating/Feeding Details (indicate cue type and reason): dificulty controlling utensil and judging depth to self feed Grooming: Moderate assistance Grooming Details (indicate cue type and reason): movement causing  nausea Upper Body Bathing: Moderate assistance;Sitting Lower Body Bathing: Maximal assistance;Sit to/from stand Upper Body Dressing : Moderate assistance;Sitting Lower Body Dressing: Maximal assistance;Sit to/from stand Toilet Transfer: Moderate assistance Toileting- Clothing Manipulation and Hygiene: Moderate assistance;Sit to/from stand Functional mobility during ADLs: Moderate assistance General ADL Comments: ADL significantly limited by c/o dizziness and nausea and ataxia  Cognition: Cognition Overall Cognitive Status: Impaired/Different from baseline Arousal/Alertness: Lethargic Orientation Level: Oriented X4 Attention: Selective Selective Attention: Impaired Selective Attention Impairment: Verbal complex Memory: Impaired Memory Impairment: Decreased short term memory;Decreased recall of new information (much improved with verbal cues and repetition) Decreased Short Term Memory: Verbal complex Awareness: Impaired Awareness Impairment: Intellectual impairment;Emergent impairment (pt developing intellectual and emergent awareness) Problem Solving:  (emerging problem solving skills regarding new impairment) Executive Function: Reasoning;Initiating;Self Monitoring;Self Correcting Reasoning: Appears intact Initiating: Appears intact Self Monitoring: Appears intact Self Correcting: Appears intact Safety/Judgment: Appears intact Cognition Arousal/Alertness: Awake/alert Behavior During Therapy: WFL for tasks assessed/performed Overall Cognitive Status: Impaired/Different from baseline Area of Impairment: Safety/judgement Safety/Judgement: Decreased awareness of safety  Blood pressure 167/71, pulse 72, temperature 98.1 F (36.7 C), temperature source Oral, resp. rate 22, height 6' (1.829 m), weight 79.379 kg (175 lb), SpO2 98.00%. Physical Exam  Constitutional: He appears well-developed.  HENT:  Head: Normocephalic.  Eyes: EOM are normal.  Neck: Normal range of motion.  Neck  supple. No thyromegaly present.  Cardiovascular: Normal rate and regular rhythm.   Respiratory: Effort normal and breath sounds normal. No respiratory distress.  GI: Soft. Bowel sounds are normal. He exhibits no distension. There is no tenderness. There is no rebound.  Neurological: He is alert.  Patient makes good eye contact with examiner. He was able to provide his name and age. He had some decrease in safety awareness. Follows simple commands. Nystagmus 7-8 beats with right gaze. Leans to right with sitting EOB. Uses arms to support. Slightly weak on right side but difficult to assess eob. At least has 3-4/5 strength on the right side. Speech slightly slurred but intellgible. Fair insight and awareness.   Skin: Skin is warm and dry.  Psychiatric:  A little withdrawn (doesn't feel good)    Results for orders placed during the hospital encounter of 09/25/13 (from the past 24 hour(s))  PLATELET INHIBITION P2Y12     Status: None   Collection Time    09/28/13 11:45 AM      Result Value Ref Range   Platelet Function  P2Y12 281  194 - 418 PRU  URINALYSIS W MICROSCOPIC     Status: Abnormal   Collection Time    09/28/13  4:11 PM      Result Value Ref Range   Color, Urine YELLOW  YELLOW   APPearance CLOUDY (*) CLEAR   Specific Gravity, Urine 1.015  1.005 - 1.030   pH 6.5  5.0 - 8.0   Glucose, UA NEGATIVE  NEGATIVE mg/dL   Hgb urine dipstick NEGATIVE  NEGATIVE   Bilirubin Urine NEGATIVE  NEGATIVE   Ketones, ur NEGATIVE  NEGATIVE mg/dL   Protein, ur NEGATIVE  NEGATIVE mg/dL   Urobilinogen, UA 0.2  0.0 - 1.0 mg/dL   Nitrite POSITIVE (*) NEGATIVE   Leukocytes, UA NEGATIVE  NEGATIVE   WBC, UA 0-2  <3 WBC/hpf   Bacteria, UA MANY (*) RARE  CBC     Status: Abnormal   Collection Time    09/29/13  2:35 AM      Result Value Ref Range   WBC 8.7  4.0 - 10.5 K/uL   RBC 3.65 (*) 4.22 - 5.81 MIL/uL   Hemoglobin 9.6 (*) 13.0 - 17.0 g/dL   HCT 31.0 (*) 39.0 - 52.0 %   MCV 84.9  78.0 - 100.0 fL    MCH 26.3  26.0 - 34.0 pg   MCHC 31.0  30.0 - 36.0 g/dL   RDW 17.1 (*) 11.5 - 15.5 %   Platelets 389  150 - 400 K/uL  BASIC METABOLIC PANEL     Status: Abnormal   Collection Time    09/29/13  2:35 AM      Result Value Ref Range   Sodium 134 (*) 137 - 147 mEq/L   Potassium 4.1  3.7 - 5.3 mEq/L   Chloride 96  96 - 112 mEq/L   CO2 27  19 - 32 mEq/L   Glucose, Bld 95  70 - 99 mg/dL   BUN 14  6 - 23 mg/dL   Creatinine, Ser 0.95  0.50 - 1.35 mg/dL   Calcium 9.3  8.4 - 10.5 mg/dL   GFR calc non Af Amer 79 (*) >90 mL/min   GFR calc Af Amer >90  >90 mL/min   Anion gap 11  5 - 15   Ct Head Wo Contrast  09/28/2013   CLINICAL DATA:  Intracranial hemorrhage  EXAM: CT HEAD WITHOUT CONTRAST  TECHNIQUE: Contiguous axial images were obtained from the base of the skull through the vertex without intravenous contrast.  COMPARISON:  Prior CT from 09/26/2013  FINDINGS: Right cerebellar hemorrhage is decreased in size now measuring 3.7 x 2.8 cm, previously 4.1 x 2.6 cm. Hemorrhage again seen extending into the fourth ventricle. Intraventricular hemorrhage layering within the posterior horns of the lateral ventricles is decreased from prior. Hemorrhage present at the cerebral aqueduct. Ventriculomegaly involving the lateral and third ventricles is slightly increased. There is increased hypodensity around the frontal horns of the lateral ventricles, suggesting transependymal flow of CSF. This is slightly more prominent than prior. The fourth ventricle remains slightly compress from a edema related to the adjacent parenchymal hemorrhage. Edema around the hemorrhage is stable to slightly increased.  Atrophy again noted. No new hemorrhage or intracranial infarct. No extra-axial fluid collection. No midline shift. Patchy supratentorial white matter disease again noted.  Scalp soft tissues and calvarium are unchanged. No acute abnormality seen about the orbits. Paranasal sinuses and mastoid air cells remain clear.   IMPRESSION: 1. Interval decrease in size of right cerebellar hemorrhage, now measuring 3.7 x 2.8 cm, previously 4.1 x 2.6 cm. 2. Interval decrease an overall volume of intraventricular hemorrhage with slightly increased ventriculomegaly involving the lateral and third ventricles. Transependymal flow of CSF is slightly more prominent as compared to prior examination, compatible with worsening hydrocephalus.   Electronically Signed   By: Jeannine Boga M.D.   On: 09/28/2013 05:16    Assessment/Plan: Diagnosis: right cerebellar ICH 1. Does the need for close, 24 hr/day medical supervision in concert with the patient's rehab needs make it unreasonable for this patient to be served in a less intensive setting? Yes 2. Co-Morbidities requiring supervision/potential complications: htn, CAD, hx of chf 3. Due to bladder management, bowel management, safety, skin/wound care, disease management, medication administration, pain management and patient education, does the patient require 24 hr/day rehab nursing? Yes 4. Does the patient require coordinated care of a physician, rehab nurse, PT (1-2 hrs/day, 5 days/week), OT (1-2 hrs/day, 5 days/week) and SLP (1-2 hrs/day, 5 days/week) to address physical and functional deficits in the context of the above medical diagnosis(es)? Yes Addressing deficits in the following areas: balance, endurance, locomotion, strength, transferring, bowel/bladder control, bathing, dressing, feeding, grooming, toileting, cognition, speech, language, swallowing and psychosocial support 5. Can the patient actively participate in an intensive therapy program of at least 3 hrs of therapy per day at least 5 days per week? Yes 6. The potential for patient to make measurable gains while on inpatient rehab is excellent 7. Anticipated functional outcomes upon discharge from inpatient rehab are supervision  with PT, supervision with OT, modified independent and supervision with  SLP. 8. Estimated rehab length of stay to reach the above functional goals is: 12-16 days 9. Does the patient have adequate social supports to accommodate these discharge functional goals? Yes 10. Anticipated D/C setting: Home 11. Anticipated post D/C treatments: HH therapy, Outpatient therapy and Home excercise program 12. Overall Rehab/Functional Prognosis: excellent  RECOMMENDATIONS: This patient's condition is appropriate for continued rehabilitative care in the following setting: CIR Patient has agreed to participate in recommended program. Yes Note that insurance prior authorization may be required for reimbursement for recommended care.  Comment: Rehab Admissions Coordinator to follow up.  Thanks,  Meredith Staggers, MD, Mellody Drown     09/29/2013

## 2013-09-29 NOTE — Progress Notes (Signed)
STROKE TEAM PROGRESS NOTE   HISTORY CORDAI RODRIGUE is a 78 y.o. male with a history of cardiac arrest in July who presents with headache that started earlier tonight. He states that it started around 9:30 pm 09/24/2013 and has been getting worse. In the ED, he was noted to have right sided weakness as well.  Of note, he had a vfib arrest in July following MI and has been recovering from this. He had two DES placed at that time to the LAD and left circumflex arteries.  Patient was not administered TPA secondary to hemorrhage. He was admitted to the neuro ICU for further evaluation and treatment.  SUBJECTIVE (INTERVAL HISTORY) Complains of mild dullness headache from the vertex. No nausea. Blood pressure adequately controlled. Neurological he has remained stable.  OBJECTIVE Temp:  [97.5 F (36.4 C)-98.3 F (36.8 C)] 98.3 F (36.8 C) (08/24 0733) Pulse Rate:  [58-74] 72 (08/24 0702) Resp:  [9-28] 22 (08/24 0702) BP: (111-186)/(54-80) 167/71 mmHg (08/24 0702) SpO2:  [93 %-99 %] 98 % (08/24 0702)  BMET    Component Value Date/Time   NA 134* 09/29/2013 0235   K 4.1 09/29/2013 0235   CL 96 09/29/2013 0235   CO2 27 09/29/2013 0235   GLUCOSE 95 09/29/2013 0235   BUN 14 09/29/2013 0235   CREATININE 0.95 09/29/2013 0235   CREATININE 1.12 09/19/2013 1524   CALCIUM 9.3 09/29/2013 0235   GFRNONAA 79* 09/29/2013 0235   GFRNONAA 63 09/19/2013 1524   GFRAA >90 09/29/2013 0235   GFRAA 73 09/19/2013 1524   CBC    Component Value Date/Time   WBC 8.7 09/29/2013 0235   RBC 3.65* 09/29/2013 0235   HGB 9.6* 09/29/2013 0235   HCT 31.0* 09/29/2013 0235   PLT 389 09/29/2013 0235   MCV 84.9 09/29/2013 0235   MCH 26.3 09/29/2013 0235   MCHC 31.0 09/29/2013 0235   RDW 17.1* 09/29/2013 0235   LYMPHSABS 0.8 09/25/2013 0045   MONOABS 0.7 09/25/2013 0045   EOSABS 0.1 09/25/2013 0045   BASOSABS 0.0 09/25/2013 0045   Lipid Panel     Component Value Date/Time   CHOL 99 05/13/2013 0833   TRIG 48 05/13/2013 0833   HDL 38*  05/13/2013 0833   CHOLHDL 2.6 05/13/2013 0833   VLDL 10 05/13/2013 0833   LDLCALC 51 05/13/2013 0833   Drugs of Abuse     Component Value Date/Time   LABOPIA NONE DETECTED 09/25/2013 0312   LABOPIA NEGATIVE 08/18/2008 0400   COCAINSCRNUR NONE DETECTED 09/25/2013 0312   COCAINSCRNUR NEGATIVE 08/18/2008 0400   LABBENZ NONE DETECTED 09/25/2013 0312   LABBENZ NEGATIVE 08/18/2008 0400   AMPHETMU NONE DETECTED 09/25/2013 0312   AMPHETMU NEGATIVE 08/18/2008 0400   THCU NONE DETECTED 09/25/2013 0312   LABBARB NONE DETECTED 09/25/2013 0312     PHYSICAL EXAM  General - Well nourished, well developed, in no apparent distress.  Ophthalmologic - not able to see through.  Cardiovascular - Regular rate and rhythm with no murmur.  Mental Status -  Awake alert and interactive. Oriented to time place and person. Speech is slightly hesitant nonfluent but able to speak sentences. Good comprehension naming and repetition. Cranial Nerves II - XII - II - Visual field intact OU. III, IV, VI - Extraocular movements intact, but had left eye gaze preference, horizontal nystagumus on right gaze with saccadic dysmetria. V - Facial sensation intact bilaterally. VII - Facial movement intact bilaterally. VIII - Hearing & vestibular intact bilaterally. X - Palate elevates  symmetrically. XI - Chin turning & shoulder shrug intact bilaterally. XII - Tongue protrusion intact.  Motor Strength - The patient's strength was normal in all extremities and pronator drift was absent.  Bulk was normal and fasciculations were absent.   Motor Tone - Muscle tone was assessed at the neck and appendages and was normal.  Reflexes - The patient's reflexes were normal in all extremities and he had no pathological reflexes.  Sensory - Light touch, temperature/pinprick were assessed and were normal.    Coordination - The patient had mild dysmetria on the right hand,    Tremor was absent.  Gait and Station - not tested due to BP  concern.   ASSESSMENT/PLAN  Mr. KILO ESHELMAN is a 78 y.o. male with hx of vfib cardiac arrest, MI with stent placement in July of this year presenting with headache and right sided weakness.  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 Volume:  15 cc.  Antiplatelet discontinued. Cardiology on board. NSG on board. MRI 8/20 showed slightly enlargement of hematoma but not significant. No significant hydrocephalus comparing with CT 8/20. Repeat CT 8/21 showed some enlargement of cerebellar hematoma but no hydrocephalus. 0n 8/23/15CT showed decreased size of ICH and IVH but concerning for developing hydrocephalus. He had fall 09/28/13 am, seems had mental status change and delirium.   more lethargic and not fully orientated, concerning for hydrocephalus. Will close monitor and if necessary, repeat CT in pm and EVD if needed.  Stroke:  right cerebellar hemorrhage with IVH secondary to hypertension likely     Continue ICU level care as per NSG  today CT showed decreased size of ICH and IVH but concerning for developing hydrocephalus. He had fall this am, seems had mental status change and delirium. Today more lethargic and not fully orientated, concerning for hydrocephalus. Will close monitor and if necessary, repeat CT in pm  Stable hence no need  forEVD  .   MRI brain 8/20 showed slightly enlargement of hematoma but no significant hydrocephalus comparing with CT overnight.  Repeat CT 8/21 showed some enlargement of cerebellar hematoma but no hydrocephalus.  Today am CT showed decreased size of ICH and IVH but concerning for developing hydrocephalus.   aspirin 81 mg orally every day and clopidogrel 75 mg orally every day prior to admission due to recent stent, now start ASA 81mg  today as per NSG.  Agree with cardiology, would consider to resume plavix alone in about 2 weeks if bleeding is stable  Would hold off lipitor for now since it is debatable whether statin may worsening  cerebral bleeding. Would consider to resume lipitor in about 7 days.  Cardiology input appreciated.  NSG is on board and following. Currently no surgical intervention. Will close monitoring. Consider EVD if hydrocephalus build up.  2D Echo  In July without source of embolus   Carotid bilateral 40-59% stenosis, mild mixed plaques recently.   HgbA1c 6.0, troponin series negative, LDL 51 in 05/2013  On heparin subq for DVT prophylaxis; d/c SCDs   Dysphagia diet  Therapy needs: CIR  Risk factor management/education  Resultant headache  Now mild and resolving  Leukocytosis  - WBC 14.9 today from 11.0 yesterday - UA was negative on 8/20, on condom catheter, will repeat UA today. - daily CBC  Hypertension, accelerated   BP 180/81 on admission  Home meds:  Cozaar, lopressor, imdur, lasix.   Restart home meds for BP control.  Continue cozzar at 50mg  and Increased  lopressor to 100  mg bid  BP 114-182 past 24h  SBP goal < 160  Hyperlipidemia  LDL 51   Patient on lipitor 80 daily at home, not yet resumed in hospital   Would hold off lipitor for now since it is debatable whether statin may worsening cerebral bleeding. Would consider to resume lipitor in about 7 days.  Hiccup - ICH related  - baclofen low dose for hiccup now stable  Other Stroke Risk Factors   ETOH use    Hx stroke/TIA 2011   coronary artery disease with cardiac arrest, MI s/p stent placement x 2 08/2013. Placed on ASA and plavix at that time. Now off due to hemorrhage. Troponin series negative so far. Pt denies CP, SOB. Cardiology consult appreciated.  Other Pertinent History  Hx CHF post MI - resume lasix and [facility] day # 4  Transfer to neurology floor and mobilize out of bed. PT/OT consults. Will likely need reahb  Antony Contras, MD   To contact Stroke Continuity provider, please refer to [email or web address] After hours, contact General Neurology

## 2013-09-29 NOTE — Telephone Encounter (Signed)
Minus Breeding, [address] Triage Cc: Dorothy Spark, MD            Please cancel this patients appt with Dr. Meda Coffee. He is in the hospital.

## 2013-09-29 NOTE — Progress Notes (Signed)
OT Cancellation Note  Patient Details Name: Taylor Keller MRN: [id number] DOB: 01/08/1936   Cancelled Treatment:    Reason Eval/Treat Not Completed: Other (comment) (pt states he is too nauseated to participate)  [place], OTR/L  [phone] 09/29/2013 09/29/2013, 3:04 PM

## 2013-09-29 NOTE — Progress Notes (Signed)
SUBJECTIVE:  No chest pain.  No SOB   PHYSICAL EXAM Filed Vitals:   09/29/13 0630 09/29/13 0700 09/29/13 0702 09/29/13 0733  BP: 148/60 186/80 167/71   Pulse: 63 74 72   Temp:    98.3 F (36.8 C)  TempSrc:    Oral  Resp: 10 22 22    Height:      Weight:      SpO2: 97% 99% 98%    General:  No distress Lungs:  Clear Heart:  RRR Abdomen:  Positive bowel sounds, no rebound no guarding Extremities:  No edema   LABS:  Results for orders placed during the hospital encounter of 09/25/13 (from the past 24 hour(s))  PLATELET INHIBITION P2Y12     Status: None   Collection Time    09/28/13 11:45 AM      Result Value Ref Range   Platelet Function  P2Y12 281  194 - 418 PRU  URINALYSIS W MICROSCOPIC     Status: Abnormal   Collection Time    09/28/13  4:11 PM      Result Value Ref Range   Color, Urine YELLOW  YELLOW   APPearance CLOUDY (*) CLEAR   Specific Gravity, Urine 1.015  1.005 - 1.030   pH 6.5  5.0 - 8.0   Glucose, UA NEGATIVE  NEGATIVE mg/dL   Hgb urine dipstick NEGATIVE  NEGATIVE   Bilirubin Urine NEGATIVE  NEGATIVE   Ketones, ur NEGATIVE  NEGATIVE mg/dL   Protein, ur NEGATIVE  NEGATIVE mg/dL   Urobilinogen, UA 0.2  0.0 - 1.0 mg/dL   Nitrite POSITIVE (*) NEGATIVE   Leukocytes, UA NEGATIVE  NEGATIVE   WBC, UA 0-2  <3 WBC/hpf   Bacteria, UA MANY (*) RARE  CBC     Status: Abnormal   Collection Time    09/29/13  2:35 AM      Result Value Ref Range   WBC 8.7  4.0 - 10.5 K/uL   RBC 3.65 (*) 4.22 - 5.81 MIL/uL   Hemoglobin 9.6 (*) 13.0 - 17.0 g/dL   HCT 31.0 (*) 39.0 - 52.0 %   MCV 84.9  78.0 - 100.0 fL   MCH 26.3  26.0 - 34.0 pg   MCHC 31.0  30.0 - 36.0 g/dL   RDW 17.1 (*) 11.5 - 15.5 %   Platelets 389  150 - 400 K/uL  BASIC METABOLIC PANEL     Status: Abnormal   Collection Time    09/29/13  2:35 AM      Result Value Ref Range   Sodium 134 (*) 137 - 147 mEq/L   Potassium 4.1  3.7 - 5.3 mEq/L   Chloride 96  96 - 112 mEq/L   CO2 27  19 - 32 mEq/L   Glucose,  Bld 95  70 - 99 mg/dL   BUN 14  6 - 23 mg/dL   Creatinine, Ser 0.95  0.50 - 1.35 mg/dL   Calcium 9.3  8.4 - 10.5 mg/dL   GFR calc non Af Amer 79 (*) >90 mL/min   GFR calc Af Amer >90  >90 mL/min   Anion gap 11  5 - 15    Intake/Output Summary (Last 24 hours) at 09/29/13 0839 Last data filed at 09/29/13 0800  Gross per 24 hour  Intake    965 ml  Output    775 ml  Net    190 ml    ASSESSMENT AND PLAN:  CAD:  P2Y12 at 281 (therapeutic  target would be less than 240).  Holding Plavis for now until he is cleared to resume by neurology.  Currently on ASA only.   HTN:  BP fluctuating.  Continue current meds.      Minus Breeding 09/29/2013 8:39 AM

## 2013-09-29 NOTE — Progress Notes (Signed)
Physical Therapy Treatment Patient Details Name: Taylor Keller MRN: [id number] DOB: February 17, 1935 Today's Date: 09/29/2013    History of Present Illness 78 yo wm brought in via [facility] for severe HA, N/V, & dizziness. Per pt the HA began around 2130 & cont to get worse. Pt with slight RUE pronation & drift, Rt facial droop, nystagmus, RLE sensory deficit. Imaging reveals cerebellar hemorrhage.    PT Comments    Pt agreeable to mobility, but limited by dizziness.  Pt leans hard to R side during mobility and requires 2nd person A for safety.  Will continue to follow.    Follow Up Recommendations  CIR     Equipment Recommendations  None recommended by PT    Recommendations for Other Services       Precautions / Restrictions Precautions Precautions: Fall Restrictions Weight Bearing Restrictions: No    Mobility  Bed Mobility Overal bed mobility: Needs Assistance Bed Mobility: Supine to Sit     Supine to sit: Mod assist     General bed mobility comments: cueing for UE use and sequencing.  pt needs increased time to complete.    Transfers Overall transfer level: Needs assistance Equipment used: Rolling walker (2 wheeled) Transfers: Sit to/from Stand Sit to Stand: Min assist;+2 physical assistance         General transfer comment: cues for UE use and getting closer to chair prior to sitting.    Ambulation/Gait Ambulation/Gait assistance: Mod assist;+2 physical assistance Ambulation Distance (Feet): 3 Feet Assistive device: Rolling walker (2 wheeled) Gait Pattern/deviations: Step-through pattern;Decreased stride length;Staggering right;Narrow base of support   Gait velocity interpretation: Below normal speed for age/gender General Gait Details: pt unsteady and leans hard to R side.  pt with increased dizziness during ambulation.     Stairs            Wheelchair Mobility    Modified Rankin (Stroke Patients Only) Modified Rankin (Stroke Patients  Only) Pre-Morbid Rankin Score: No significant disability Modified Rankin: Moderately severe disability     Balance Overall balance assessment: Needs assistance Sitting-balance support: Feet supported;Single extremity supported Sitting balance-Leahy Scale: Poor     Standing balance support: Bilateral upper extremity supported Standing balance-Leahy Scale: Poor                      Cognition Arousal/Alertness: Awake/alert Behavior During Therapy: WFL for tasks assessed/performed Overall Cognitive Status: Impaired/Different from baseline Area of Impairment: Safety/judgement         Safety/Judgement: Decreased awareness of safety          Exercises      General Comments        Pertinent Vitals/Pain Pain Assessment: Faces Pain Score: 4  Faces Pain Scale: Hurts little more Pain Location: Headache Pain Descriptors / Indicators: Headache Pain Intervention(s): Premedicated before session;Repositioned    Home Living                      Prior Function            PT Goals (current goals can now be found in the care plan section) Acute Rehab PT Goals Patient Stated Goal: to be able to take care of myself PT Goal Formulation: With patient/family Time For Goal Achievement: 10/10/13 Potential to Achieve Goals: Good Progress towards PT goals: Progressing toward goals    Frequency  Min 3X/week    PT Plan Current plan remains appropriate    Co-evaluation  End of Session Equipment Utilized During Treatment: Gait belt Activity Tolerance: Patient tolerated treatment well Patient left: in chair;with call bell/phone within reach;with family/visitor present     Time: 0156-1537 PT Time Calculation (min): 27 min  Charges:  $Gait Training: 8-22 mins $Therapeutic Activity: 8-22 mins                    G CodesCatarina Hartshorn, [place] 09/29/2013, 3:33 PM

## 2013-09-29 NOTE — Progress Notes (Signed)
Patient ID: Taylor Keller, male   DOB: [birth year], 78 y.o.   MRN: [id number] Subjective: Patient reports mild headache.  No nausea or vomiting.  He has not been out of bed.  Objective: Vital signs in last 24 hours: Temp:  [97.5 F (36.4 C)-98.3 F (36.8 C)] 98.3 F (36.8 C) (08/24 0733) Pulse Rate:  [58-74] 72 (08/24 0702) Resp:  [9-28] 22 (08/24 0702) BP: (111-186)/(54-80) 167/71 mmHg (08/24 0702) SpO2:  [93 %-99 %] 98 % (08/24 0702)  Intake/Output from previous day: 08/23 0701 - 08/24 0700 In: 1015 [P.O.:490; IV Piggyback:525] Out: 700 [Urine:700] Intake/Output this shift: Total I/O In: -  Out: 75 [Urine:75]  Neurologic: Grossly normal, he is awake and alert and conversant.  He moves all extremities equally.  No facial asymmetry.  No nystagmus. EOMI2  Lab Results: Lab Results  Component Value Date   WBC 8.7 09/29/2013   HGB 9.6* 09/29/2013   HCT 31.0* 09/29/2013   MCV 84.9 09/29/2013   PLT 389 09/29/2013   Lab Results  Component Value Date   INR 1.38 09/25/2013   BMET Lab Results  Component Value Date   NA 134* 09/29/2013   K 4.1 09/29/2013   CL 96 09/29/2013   CO2 27 09/29/2013   GLUCOSE 95 09/29/2013   BUN 14 09/29/2013   CREATININE 0.95 09/29/2013   CALCIUM 9.3 09/29/2013    Studies/Results: Ct Head Wo Contrast  09/28/2013   CLINICAL DATA:  Intracranial hemorrhage  EXAM: CT HEAD WITHOUT CONTRAST  TECHNIQUE: Contiguous axial images were obtained from the base of the skull through the vertex without intravenous contrast.  COMPARISON:  Prior CT from 09/26/2013  FINDINGS: Right cerebellar hemorrhage is decreased in size now measuring 3.7 x 2.8 cm, previously 4.1 x 2.6 cm. Hemorrhage again seen extending into the fourth ventricle. Intraventricular hemorrhage layering within the posterior horns of the lateral ventricles is decreased from prior. Hemorrhage present at the cerebral aqueduct. Ventriculomegaly involving the lateral and third ventricles is slightly increased.  There is increased hypodensity around the frontal horns of the lateral ventricles, suggesting transependymal flow of CSF. This is slightly more prominent than prior. The fourth ventricle remains slightly compress from a edema related to the adjacent parenchymal hemorrhage. Edema around the hemorrhage is stable to slightly increased.  Atrophy again noted. No new hemorrhage or intracranial infarct. No extra-axial fluid collection. No midline shift. Patchy supratentorial white matter disease again noted.  Scalp soft tissues and calvarium are unchanged. No acute abnormality seen about the orbits. Paranasal sinuses and mastoid air cells remain clear.  IMPRESSION: 1. Interval decrease in size of right cerebellar hemorrhage, now measuring 3.7 x 2.8 cm, previously 4.1 x 2.6 cm. 2. Interval decrease an overall volume of intraventricular hemorrhage with slightly increased ventriculomegaly involving the lateral and third ventricles. Transependymal flow of CSF is slightly more prominent as compared to prior examination, compatible with worsening hydrocephalus.   Electronically Signed   By: Jeannine Boga M.D.   On: 09/28/2013 05:16    Assessment/Plan: Overall stable.  Following.   LOS: 4 days    Jatara Huettner S 09/29/2013, 8:25 AM

## 2013-09-29 NOTE — Telephone Encounter (Signed)
Message copied by Hetty Blend on Mon Sep 29, 2013  9:01 AM ------      Message from: Minus Breeding      Created: Mon Sep 29, 2013  8:53 AM       Please cancel this patients appt with Dr. Meda Coffee.  He is in the hospital.   ------

## 2013-09-30 ENCOUNTER — Encounter: Payer: Medicare HMO | Admitting: Cardiology

## 2013-09-30 ENCOUNTER — Inpatient Hospital Stay (HOSPITAL_COMMUNITY)
Admission: RE | Admit: 2013-09-30 | Discharge: 2013-10-07 | DRG: 945 | Disposition: A | Discharge status: Other Acute Inpatient Hospital | Payer: Medicare HMO | Source: Intra-hospital | Attending: Physical Medicine & Rehabilitation | Admitting: Physical Medicine & Rehabilitation

## 2013-09-30 ENCOUNTER — Inpatient Hospital Stay (HOSPITAL_COMMUNITY): Payer: Medicare HMO

## 2013-09-30 DIAGNOSIS — R1310 Dysphagia, unspecified: Secondary | ICD-10-CM | POA: Diagnosis present

## 2013-09-30 DIAGNOSIS — Z9089 Acquired absence of other organs: Secondary | ICD-10-CM | POA: Diagnosis not present

## 2013-09-30 DIAGNOSIS — Z7982 Long term (current) use of aspirin: Secondary | ICD-10-CM | POA: Diagnosis not present

## 2013-09-30 DIAGNOSIS — Z5189 Encounter for other specified aftercare: Principal | ICD-10-CM

## 2013-09-30 DIAGNOSIS — R29898 Other symptoms and signs involving the musculoskeletal system: Secondary | ICD-10-CM | POA: Diagnosis present

## 2013-09-30 DIAGNOSIS — Z888 Allergy status to other drugs, medicaments and biological substances status: Secondary | ICD-10-CM

## 2013-09-30 DIAGNOSIS — E785 Hyperlipidemia, unspecified: Secondary | ICD-10-CM | POA: Diagnosis present

## 2013-09-30 DIAGNOSIS — Z9849 Cataract extraction status, unspecified eye: Secondary | ICD-10-CM

## 2013-09-30 DIAGNOSIS — I619 Nontraumatic intracerebral hemorrhage, unspecified: Secondary | ICD-10-CM | POA: Diagnosis present

## 2013-09-30 DIAGNOSIS — R279 Unspecified lack of coordination: Secondary | ICD-10-CM | POA: Diagnosis present

## 2013-09-30 DIAGNOSIS — Z8673 Personal history of transient ischemic attack (TIA), and cerebral infarction without residual deficits: Secondary | ICD-10-CM

## 2013-09-30 DIAGNOSIS — N390 Urinary tract infection, site not specified: Secondary | ICD-10-CM | POA: Diagnosis present

## 2013-09-30 DIAGNOSIS — R066 Hiccough: Secondary | ICD-10-CM | POA: Diagnosis not present

## 2013-09-30 DIAGNOSIS — I10 Essential (primary) hypertension: Secondary | ICD-10-CM | POA: Diagnosis present

## 2013-09-30 DIAGNOSIS — M48061 Spinal stenosis, lumbar region without neurogenic claudication: Secondary | ICD-10-CM | POA: Diagnosis present

## 2013-09-30 DIAGNOSIS — Z79899 Other long term (current) drug therapy: Secondary | ICD-10-CM | POA: Diagnosis not present

## 2013-09-30 DIAGNOSIS — E039 Hypothyroidism, unspecified: Secondary | ICD-10-CM | POA: Diagnosis present

## 2013-09-30 DIAGNOSIS — K117 Disturbances of salivary secretion: Secondary | ICD-10-CM | POA: Diagnosis present

## 2013-09-30 DIAGNOSIS — H5500 Unspecified nystagmus: Secondary | ICD-10-CM | POA: Diagnosis present

## 2013-09-30 DIAGNOSIS — Z9861 Coronary angioplasty status: Secondary | ICD-10-CM

## 2013-09-30 DIAGNOSIS — Z800 Family history of malignant neoplasm of digestive organs: Secondary | ICD-10-CM | POA: Diagnosis not present

## 2013-09-30 DIAGNOSIS — I6789 Other cerebrovascular disease: Secondary | ICD-10-CM

## 2013-09-30 DIAGNOSIS — Z8674 Personal history of sudden cardiac arrest: Secondary | ICD-10-CM | POA: Diagnosis not present

## 2013-09-30 DIAGNOSIS — I2510 Atherosclerotic heart disease of native coronary artery without angina pectoris: Secondary | ICD-10-CM | POA: Diagnosis present

## 2013-09-30 DIAGNOSIS — G4700 Insomnia, unspecified: Secondary | ICD-10-CM | POA: Diagnosis present

## 2013-09-30 DIAGNOSIS — R4789 Other speech disturbances: Secondary | ICD-10-CM | POA: Diagnosis present

## 2013-09-30 DIAGNOSIS — I616 Nontraumatic intracerebral hemorrhage, multiple localized: Secondary | ICD-10-CM

## 2013-09-30 LAB — CBC
HEMATOCRIT: 32.2 % — AB (ref 39.0–52.0)
Hemoglobin: 10.1 g/dL — ABNORMAL LOW (ref 13.0–17.0)
MCH: 26.3 pg (ref 26.0–34.0)
MCHC: 31.4 g/dL (ref 30.0–36.0)
MCV: 83.9 fL (ref 78.0–100.0)
Platelets: 436 10*3/uL — ABNORMAL HIGH (ref 150–400)
RBC: 3.84 MIL/uL — ABNORMAL LOW (ref 4.22–5.81)
RDW: 17 % — AB (ref 11.5–15.5)
WBC: 14.7 10*3/uL — ABNORMAL HIGH (ref 4.0–10.5)

## 2013-09-30 LAB — BASIC METABOLIC PANEL
Anion gap: 12 (ref 5–15)
BUN: 15 mg/dL (ref 6–23)
CALCIUM: 9.1 mg/dL (ref 8.4–10.5)
CO2: 25 mEq/L (ref 19–32)
Chloride: 93 mEq/L — ABNORMAL LOW (ref 96–112)
Creatinine, Ser: 1.03 mg/dL (ref 0.50–1.35)
GFR, EST AFRICAN AMERICAN: 79 mL/min — AB (ref >90)
GFR, EST NON AFRICAN AMERICAN: 68 mL/min — AB (ref >90)
Glucose, Bld: 102 mg/dL — ABNORMAL HIGH (ref 70–99)
POTASSIUM: 4 meq/L (ref 3.7–5.3)
Sodium: 130 mEq/L — ABNORMAL LOW (ref 137–147)

## 2013-09-30 MED ORDER — LOSARTAN POTASSIUM 50 MG PO TABS
50.0000 mg | ORAL_TABLET | Freq: Every day | ORAL | Status: DC
Start: 2013-10-01 — End: 2013-10-07
  Administered 2013-10-01 – 2013-10-07 (×6): 50 mg via ORAL
  Filled 2013-09-30 (×8): qty 1

## 2013-09-30 MED ORDER — HYDROCODONE-ACETAMINOPHEN 5-325 MG PO TABS: 1.0000 | ORAL_TABLET | ORAL | Status: DC | PRN

## 2013-09-30 MED ORDER — FUROSEMIDE 40 MG PO TABS
40.0000 mg | ORAL_TABLET | Freq: Every day | ORAL | Status: DC
  Administered 2013-10-01 – 2013-10-07 (×6): 40 mg via ORAL
  Filled 2013-09-30 (×8): qty 1

## 2013-09-30 MED ORDER — ATORVASTATIN CALCIUM 80 MG PO TABS
80.0000 mg | ORAL_TABLET | Freq: Every evening | ORAL | Status: DC
  Administered 2013-09-30 – 2013-10-05 (×6): 80 mg via ORAL
  Filled 2013-09-30 (×8): qty 1

## 2013-09-30 MED ORDER — ONDANSETRON HCL 4 MG PO TABS
4.0000 mg | ORAL_TABLET | Freq: Four times a day (QID) | ORAL | Status: DC | PRN
  Filled 2013-09-30 (×2): qty 1

## 2013-09-30 MED ORDER — ASPIRIN EC 81 MG PO TBEC
81.0000 mg | DELAYED_RELEASE_TABLET | Freq: Every day | ORAL | Status: DC
  Administered 2013-10-01 – 2013-10-07 (×6): 81 mg via ORAL
  Filled 2013-09-30 (×8): qty 1

## 2013-09-30 MED ORDER — ISOSORBIDE MONONITRATE ER 60 MG PO TB24
60.0000 mg | ORAL_TABLET | Freq: Every day | ORAL | Status: DC
  Administered 2013-10-01 – 2013-10-07 (×6): 60 mg via ORAL
  Filled 2013-09-30 (×8): qty 1

## 2013-09-30 MED ORDER — SORBITOL 70 % SOLN
30.0000 mL | Freq: Every day | Status: DC | PRN
  Administered 2013-09-30: 30 mL via ORAL
  Filled 2013-09-30: qty 30

## 2013-09-30 MED ORDER — HEPARIN SODIUM (PORCINE) 5000 UNIT/ML IJ SOLN: 5000.0000 [IU] | Freq: Three times a day (TID) | INTRAMUSCULAR | Status: DC

## 2013-09-30 MED ORDER — HEPARIN SODIUM (PORCINE) 5000 UNIT/ML IJ SOLN
5000.0000 [IU] | Freq: Three times a day (TID) | INTRAMUSCULAR | Status: DC
  Administered 2013-09-30 – 2013-10-07 (×20): 5000 [IU] via SUBCUTANEOUS
  Filled 2013-09-30 (×24): qty 1

## 2013-09-30 MED ORDER — ATORVASTATIN CALCIUM 80 MG PO TABS: 80.0000 mg | ORAL_TABLET | Freq: Every evening | ORAL | Status: DC

## 2013-09-30 MED ORDER — ACETAMINOPHEN 325 MG PO TABS
650.0000 mg | ORAL_TABLET | ORAL | Status: DC | PRN
  Administered 2013-10-06 – 2013-10-07 (×3): 650 mg via ORAL
  Filled 2013-09-30 (×3): qty 2

## 2013-09-30 MED ORDER — PANTOPRAZOLE SODIUM 40 MG PO TBEC
40.0000 mg | DELAYED_RELEASE_TABLET | Freq: Every day | ORAL | Status: DC
  Administered 2013-10-01 – 2013-10-07 (×6): 40 mg via ORAL
  Filled 2013-09-30 (×7): qty 1

## 2013-09-30 MED ORDER — ONDANSETRON HCL 4 MG/2ML IJ SOLN
4.0000 mg | Freq: Four times a day (QID) | INTRAMUSCULAR | Status: DC | PRN
  Administered 2013-10-01: 4 mg via INTRAVENOUS
  Filled 2013-09-30: qty 2

## 2013-09-30 MED ORDER — LEVOTHYROXINE SODIUM 175 MCG PO TABS
175.0000 ug | ORAL_TABLET | Freq: Every day | ORAL | Status: DC
  Administered 2013-10-01 – 2013-10-07 (×7): 175 ug via ORAL
  Filled 2013-09-30 (×8): qty 1

## 2013-09-30 MED ORDER — BACLOFEN 5 MG HALF TABLET
5.0000 mg | ORAL_TABLET | Freq: Three times a day (TID) | ORAL | Status: DC
Start: 2013-09-30 — End: 2013-10-01
  Administered 2013-09-30 – 2013-10-01 (×2): 5 mg via ORAL
  Filled 2013-09-30 (×6): qty 1

## 2013-09-30 MED ORDER — ENSURE COMPLETE PO LIQD
237.0000 mL | Freq: Two times a day (BID) | ORAL | Status: DC
  Administered 2013-10-02 – 2013-10-07 (×6): 237 mL via ORAL

## 2013-09-30 MED ORDER — SULFAMETHOXAZOLE-TRIMETHOPRIM 400-80 MG/5ML IV SOLN
400.0000 mg | Freq: Two times a day (BID) | INTRAVENOUS | Status: DC
  Administered 2013-09-30 – 2013-10-01 (×2): 400 mg via INTRAVENOUS
  Filled 2013-09-30 (×3): qty 25

## 2013-09-30 MED ORDER — METOPROLOL TARTRATE 100 MG PO TABS
100.0000 mg | ORAL_TABLET | Freq: Two times a day (BID) | ORAL | Status: DC
  Administered 2013-09-30 – 2013-10-07 (×13): 100 mg via ORAL
  Filled 2013-09-30 (×16): qty 1

## 2013-09-30 MED ORDER — SENNOSIDES-DOCUSATE SODIUM 8.6-50 MG PO TABS
1.0000 | ORAL_TABLET | Freq: Two times a day (BID) | ORAL | Status: DC
  Administered 2013-09-30 – 2013-10-07 (×12): 1 via ORAL
  Filled 2013-09-30 (×2): qty 1
  Filled 2013-09-30: qty 2
  Filled 2013-09-30 (×11): qty 1

## 2013-09-30 MED ORDER — RESOURCE THICKENUP CLEAR PO POWD
ORAL | Status: DC | PRN
  Filled 2013-09-30: qty 125

## 2013-09-30 NOTE — Care Management Note (Addendum)
  Page 1 of 1   09/30/2013     3:26:16 PM CARE MANAGEMENT NOTE 09/30/2013  Patient:  Taylor Keller, Taylor Keller   Account Number:  [id number]  Date Initiated:  09/26/2013  Documentation initiated by:  Sandi Mariscal  Subjective/Objective Assessment:   Patient was admitted with cerebellar hematoma. Lives at home with spouse     Action/Plan:   see below   Anticipated DC Date:     Anticipated DC Plan:  IP REHAB FACILITY         Choice offered to / List presented to:             Status of service:  In process, will continue to follow Medicare Important Message given?  YES (If response is "NO", the following Medicare IM given date fields will be blank) Date Medicare IM given:  09/30/2013 Medicare IM given by:  Lorne Skeens Date Additional Medicare IM given:   Additional Medicare IM given by:    Discharge Disposition:    Per UR Regulation:  Reviewed for med. necessity/level of care/duration of stay  If discussed at [place] of Stay Meetings, dates discussed:    Comments:  09/30/13 [place], MSN, CM- Medicare IM letter provided.    09/26/2013 Sandi Mariscal, RN BSN MHA CCM [id number]--Received call from Docia Barrier, RN Case Manager from [place]. She wanted a call regarding discharge plans when they are known for this patient.  Pt is currently being screened for CIR.  If patient needs SNF, her message indicated that that should be authorized by Su Hoff at [phone]. If the patient requires [facility], his network includes [place], [place] and [place].  She also asked that the d/c summary be faxed to Harlin Rain at [phone]. Michelle's call back number is [phone] ext. [phone]. I have called her back to let her know pt was seen by PT/OT today and that they are recommending CIR.

## 2013-09-30 NOTE — Progress Notes (Signed)
Physical Therapy Treatment Patient Details Name: Taylor Keller MRN: [id number] DOB: 08-14-1935 Today's Date: 10/08/13    History of Present Illness 78 yo wm brought in via [facility] for severe HA, N/V, & dizziness. Per pt the HA began around 2130 & cont to get worse. Pt with slight RUE pronation & drift, Rt facial droop, nystagmus, RLE sensory deficit. Imaging reveals cerebellar hemorrhage.    PT Comments    Pt making steady progress with mobility. Still would highly benefit from CIR before discharge home with family.  Follow Up Recommendations  CIR     Equipment Recommendations  None recommended by PT    Recommendations for Other Services Rehab consult     Precautions / Restrictions Precautions Precautions: Fall Restrictions Weight Bearing Restrictions: No    Mobility  Bed Mobility Overal bed mobility: Needs Assistance Bed Mobility: Supine to Sit     Supine to sit: Mod assist     General bed mobility comments: cueing for UE use and sequencing.  pt needs increased time to complete.    Transfers Overall transfer level: Needs assistance Equipment used: Rolling walker (2 wheeled) Transfers: Sit to/from Stand Sit to Stand: Min assist;+2 physical assistance Stand pivot transfers: Mod assist;+2 physical assistance       General transfer comment: cues for use of arms to stand and to sit down. assist to control descent with sitting down. Pt able to take 4-5 steps from bed to chair with 2 person assist.  Ambulation/Gait                 Stairs            Wheelchair Mobility    Modified Rankin (Stroke Patients Only)       Balance Overall balance assessment: Needs assistance Sitting-balance support: Feet supported;Single extremity supported Sitting balance-Leahy Scale: Fair Sitting balance - Comments: pt able to correct posture to midline with verbal/visual cues. Postural control: Left lateral lean                          Cognition  Arousal/Alertness: Awake/alert Behavior During Therapy: WFL for tasks assessed/performed Overall Cognitive Status: Impaired/Different from baseline Area of Impairment: Safety/judgement;Awareness         Safety/Judgement: Decreased awareness of safety;Decreased awareness of deficits           Pertinent Vitals/Pain Pain Assessment: 0-10 Pain Score: 2  Pain Location: headache Pain Descriptors / Indicators: Aching Pain Intervention(s): Premedicated before session;Monitored during session     PT Goals (current goals can now be found in the care plan section) Acute Rehab PT Goals Patient Stated Goal: to be able to take care of myself PT Goal Formulation: With patient/family Time For Goal Achievement: 10/10/13 Potential to Achieve Goals: Good Progress towards PT goals: Progressing toward goals    Frequency  Min 3X/week    PT Plan Current plan remains appropriate       End of Session Equipment Utilized During Treatment: Gait belt Activity Tolerance: Patient tolerated treatment well Patient left: in chair;with call bell/phone within reach;with family/visitor present     Time: 9935-7017 PT Time Calculation (min): 18 min  Charges:  $Therapeutic Activity: 8-22 mins                    G Codes:      Willow Ora 10/08/2013, 1:42 PM  Willow Ora, PTA Office- [phone]

## 2013-09-30 NOTE — Progress Notes (Signed)
Rehab admissions - We did receive insurance authorization from [facility] for inpatient rehab and I received medical clearance from neuro NP Ivin Booty. Bed is available and will admit pt later today.  I left a voicemail with pt's wife and will update pt directly soon. I updated Loma Sousa, case Paediatric nurse.  Please call me with any questions. Thanks.  Nanetta Batty, PT Rehabilitation Admissions Coordinator [phone]

## 2013-09-30 NOTE — Progress Notes (Signed)
Speech Language Pathology Treatment: Dysphagia;Cognitive-Linquistic  Patient Details Name: Taylor Keller MRN: [id number] DOB: [birth year] Today's Date: 09/30/2013 Time: 1000-1015 SLP Time Calculation (min): 15 min  Assessment / Plan / Recommendation Clinical Impression  SLP provided therapeutic intervention to determine readiness for diet upgrade and improve awareness and attention to right visual field. With moderate verbal cues, pt made visual contact with speaker on right and completed functional tasks on the right. Pt verbalizes very good intellectual awareness regarding safety, though emergent awareness is still impaired with pt requiring sitter at bedside.  Pt demonstrated tolerance of thin liquids when bolus size limited to small sips with min verbal cues. Pt also able to masticate solids without residuals or attention impairment. Given poor appetite with modified diet, recommend upgrade to Regular textures and thin liquids with full supervision. Will monitor for tolerance.    HPI     Pertinent Vitals    SLP Plan  Continue with current plan of care    Recommendations Diet recommendations: Regular;Thin liquid Liquids provided via: Cup;No straw Medication Administration: Whole meds with puree Supervision: Patient able to self feed;Full supervision/cueing for compensatory strategies Compensations: Slow rate;Small sips/bites;Clear throat intermittently Postural Changes and/or Swallow Maneuvers: Seated upright 90 degrees              General recommendations: Rehab consult Oral Care Recommendations: Oral care BID Follow up Recommendations: Inpatient Rehab Plan: Continue with current plan of care    GO    [facility], [place] CCC-SLP [phone]  Taylor Keller 09/30/2013, 10:43 AM

## 2013-09-30 NOTE — H&P (Signed)
Physical Medicine and Rehabilitation Admission H&P  Chief Complaint   Patient presents with   .  Code Stroke   :  HPI: Taylor Keller is a 78 y.o. right-handed male with history of CAD with cardiac arrest July 2015 and maintained on Plavix. CVA 2011 with little residual weakness. Independent prior to admission living with his wife. Presented 09/25/2013 with headache, nausea and vomiting as well as right-sided weakness. Denies any recent fall or trauma. CT/MRI of the brain showed a 2.6 x 1.9 cm hematoma in the inferior right cerebellum. Neurosurgery Dr. Sherley Bounds consulted advise conservative care. Noted fall 09/27/2013 while in the hospital as patient attempted to get out of bed unassisted. A followup cranial CT scan completed showing interval decrease in size of right cerebellar hemorrhage with slightly increased ventriculomegaly involving the lateral and third ventricles. Maintained on a dysphagia 2 nectar thick liquid diet. Subcutaneous heparin initiated for DVT prophylaxis 09/28/2013. Urinalysis 09/28/2013 positive nitrite with many bacteria placed on empiric Bactrim. Physical and occupational therapy evaluations completed 09/26/2013 with recommendations of physical medicine rehabilitation consult. Patient was admitted for comprehensive rehabilitation program  ROS Review of Systems  Cardiovascular: Positive for chest pain and palpitations.  Musculoskeletal: Positive for back pain.  Psychiatric/Behavioral: The patient has insomnia.  All other systems reviewed and are negative  Past Medical History   Diagnosis  Date   .  Lumbar stenosis      L5   .  Hypothyroidism    .  Hyperlipidemia    .  Hypertension    .  ED (erectile dysfunction)    .  Internal carotid artery stenosis  5/15     bilateral, 40-59%   .  Myocardial infarction  08/2013   .  Heart murmur    .  Stroke  ~ 2011     "slight; no permanent damage"    Past Surgical History   Procedure  Laterality  Date   .  Tonsillectomy  and adenoidectomy     .  Appendectomy     .  Coronary angioplasty with stent placement   08/2013     "2"   .  Cataract extraction, bilateral  Bilateral  ~ 2012    Family History   Problem  Relation  Age of Onset   .  Colon cancer  Sister      colon    Social History: reports that he has never smoked. He has never used smokeless tobacco. He reports that he drinks alcohol. He reports that he does not use illicit drugs.  Allergies:  Allergies   Allergen  Reactions   .  Benazepril  Cough    Medications Prior to Admission   Medication  Sig  Dispense  Refill   .  acetaminophen (TYLENOL) 325 MG tablet  Take 2 tablets (650 mg total) by mouth every 4 (four) hours as needed for headache or mild pain.     Marland Kitchen  albuterol (PROVENTIL) (5 MG/ML) 0.5% nebulizer solution  Take 2.5 mg by nebulization every 6 (six) hours as needed for wheezing or shortness of breath.     Marland Kitchen  aspirin 81 MG chewable tablet  Chew 81 mg by mouth daily.     Marland Kitchen  atorvastatin (LIPITOR) 80 MG tablet  Take 80 mg by mouth every evening.     .  clopidogrel (PLAVIX) 75 MG tablet  Take 75 mg by mouth daily.     .  diphenhydrAMINE (BENADRYL) 25 MG tablet  Take 25 mg  by mouth every 6 (six) hours as needed.     .  furosemide (LASIX) 40 MG tablet  Take 1 tablet (40 mg total) by mouth daily.  30 tablet  11   .  ipratropium (ATROVENT) 0.02 % nebulizer solution  Take 0.5 mg by nebulization every 6 (six) hours as needed for wheezing or shortness of breath.     .  isosorbide mononitrate (IMDUR) 60 MG 24 hr tablet  Take 60 mg by mouth daily.     .  lansoprazole (PREVACID SOLUTAB) 30 MG disintegrating tablet  Take 30 mg by mouth daily.     Marland Kitchen  levothyroxine (SYNTHROID, LEVOTHROID) 175 MCG tablet  Take 175 mcg by mouth daily.     Marland Kitchen  losartan (COZAAR) 25 MG tablet  Take 1 tablet (25 mg total) by mouth daily.  30 tablet  11   .  metoprolol (LOPRESSOR) 50 MG tablet  Take 50 mg by mouth 2 (two) times daily.     .  nitroGLYCERIN (NITROSTAT) 0.4 MG SL  tablet  Place 1 tablet (0.4 mg total) under the tongue every 5 (five) minutes as needed for chest pain.  25 tablet  prn   .  zolpidem (AMBIEN) 5 MG tablet  Take 1 tablet (5 mg total) by mouth at bedtime as needed for sleep.  30 tablet  1    Home:  Home Living  Family/patient expects to be discharged to:: Private residence  Living Arrangements: Spouse/significant other  Available Help at Discharge: Family;Available 24 hours/day  Type of Home: House  Home Access: Stairs to enter  [employer] of Steps: 4  Entrance Stairs-Rails: Left  Home Layout: Multi-level;Able to live on main level with bedroom/bathroom  Home Equipment: Gilford Rile - 2 wheels;Bedside commode  Lives With: Spouse  Functional History:  Prior Function  Level of Independence: Independent  Comments: retired Geophysicist/field seismologist Status:  Mobility:  Bed Mobility  Overal bed mobility: Needs Assistance  Bed Mobility: Supine to Sit  Supine to sit: Mod assist  General bed mobility comments: cueing for UE use and sequencing. pt needs increased time to complete.  Transfers  Overall transfer level: Needs assistance  Equipment used: Rolling walker (2 wheeled)  Transfers: Sit to/from Stand  Sit to Stand: Min assist;+2 physical assistance  Stand pivot transfers: Mod assist;+2 physical assistance  General transfer comment: cues for UE use and getting closer to chair prior to sitting.  Ambulation/Gait  Ambulation/Gait assistance: Mod assist;+2 physical assistance  Ambulation Distance (Feet): 3 Feet  Assistive device: Rolling walker (2 wheeled)  Gait Pattern/deviations: Step-through pattern;Decreased stride length;Staggering right;Narrow base of support  Gait velocity: decreased  Gait velocity interpretation: Below normal speed for age/gender  General Gait Details: pt unsteady and leans hard to R side. pt with increased dizziness during ambulation.   ADL:  ADL  Overall ADL's : Needs assistance/impaired  Eating/Feeding:  Minimal assistance;Sitting  Eating/Feeding Details (indicate cue type and reason): dificulty controlling utensil and judging depth to self feed  Grooming: Moderate assistance  Grooming Details (indicate cue type and reason): movement causing nausea  Upper Body Bathing: Moderate assistance;Sitting  Lower Body Bathing: Maximal assistance;Sit to/from stand  Upper Body Dressing : Moderate assistance;Sitting  Lower Body Dressing: Maximal assistance;Sit to/from stand  Toilet Transfer: Moderate assistance  Toileting- Clothing Manipulation and Hygiene: Moderate assistance;Sit to/from stand  Functional mobility during ADLs: Moderate assistance  General ADL Comments: ADL significantly limited by c/o dizziness and nausea and ataxia  Cognition:  Cognition  Overall Cognitive  Status: Impaired/Different from baseline  Arousal/Alertness: Lethargic  Orientation Level: Oriented to person;Oriented to place;Oriented to time  Attention: Selective  Selective Attention: Impaired  Selective Attention Impairment: Verbal complex  Memory: Impaired  Memory Impairment: Decreased short term memory;Decreased recall of new information (much improved with verbal cues and repetition)  Decreased Short Term Memory: Verbal complex  Awareness: Impaired  Awareness Impairment: Intellectual impairment;Emergent impairment (pt developing intellectual and emergent awareness)  Problem Solving: (emerging problem solving skills regarding new impairment)  Executive Function: Reasoning;Initiating;Self Monitoring;Self Correcting  Reasoning: Appears intact  Initiating: Appears intact  Self Monitoring: Appears intact  Self Correcting: Appears intact  Safety/Judgment: Appears intact  Cognition  Arousal/Alertness: Awake/alert  Behavior During Therapy: WFL for tasks assessed/performed  Overall Cognitive Status: Impaired/Different from baseline  Area of Impairment: Safety/judgement  Safety/Judgement: Decreased awareness of safety      Physical Exam:  Blood pressure 155/66, pulse 68, temperature 97.7 F (36.5 C), temperature source Oral, resp. rate 18, height 6' (1.829 m), weight 79.379 kg (175 lb), SpO2 96.00%.   Constitutional: He appears well-developed. More comfortable today HENT: oral mucosa pink and moist. Dentition fair/ partial plates Head: Normocephalic.  Eyes: EOM are normal.  Neck: Normal range of motion. Neck supple. No thyromegaly present.  Cardiovascular: Normal rate and regular rhythm. No murmurs Respiratory: Effort normal and breath sounds normal. No respiratory distress.  GI: Soft. Bowel sounds are normal. He exhibits no distension. There is no tenderness. There is no rebound.  Neurological: He is more alert.  Patient makes good eye contact with examiner. He was able to provide his name and age.  Follows simple commands. improved awareness and insight.  8-9 beats of nystagmus with right gaze. Leans to right with sitting EOB---mild right limb ataxia with FTN. Mild right PD.    4 to 4+/5 strength deltoid, bicep, tricep, HI. RLE: 3+ right HF, 4/5 KE and ADF/APF. No gross sensory abnl. Left arm and leg grossly 4+ to 5/5 . Speech slightly slurred but very intellgible.    Skin: Skin is warm and dry.  Psychiatric:  Pleasant and appropriate.     Results for orders placed during the hospital encounter of 09/25/13 (from the past 48 hour(s))   PLATELET INHIBITION P2Y12 Status: None    Collection Time    09/28/13 11:45 AM   Result  Value  Ref Range    Platelet Function P2Y12  281  194 - 418 PRU   URINALYSIS W MICROSCOPIC Status: Abnormal    Collection Time    09/28/13 4:11 PM   Result  Value  Ref Range    Color, Urine  YELLOW  YELLOW    APPearance  CLOUDY (*)  CLEAR    Specific Gravity, Urine  1.015  1.005 - 1.030    pH  6.5  5.0 - 8.0    Glucose, UA  NEGATIVE  NEGATIVE mg/dL    Hgb urine dipstick  NEGATIVE  NEGATIVE    Bilirubin Urine  NEGATIVE  NEGATIVE    Ketones, ur  NEGATIVE  NEGATIVE mg/dL     Protein, ur  NEGATIVE  NEGATIVE mg/dL    Urobilinogen, UA  0.2  0.0 - 1.0 mg/dL    Nitrite  POSITIVE (*)  NEGATIVE    Leukocytes, UA  NEGATIVE  NEGATIVE    WBC, UA  0-2  <3 WBC/hpf    Bacteria, UA  MANY (*)  RARE   CBC Status: Abnormal    Collection Time    09/29/13 2:35 AM  Result  Value  Ref Range    WBC  8.7  4.0 - 10.5 K/uL    RBC  3.65 (*)  4.22 - 5.81 MIL/uL    Hemoglobin  9.6 (*)  13.0 - 17.0 g/dL    HCT  31.0 (*)  39.0 - 52.0 %    MCV  84.9  78.0 - 100.0 fL    MCH  26.3  26.0 - 34.0 pg    MCHC  31.0  30.0 - 36.0 g/dL    RDW  17.1 (*)  11.5 - 15.5 %    Platelets  389  150 - 400 K/uL   BASIC METABOLIC PANEL Status: Abnormal    Collection Time    09/29/13 2:35 AM   Result  Value  Ref Range    Sodium  134 (*)  137 - 147 mEq/L    Potassium  4.1  3.7 - 5.3 mEq/L    Chloride  96  96 - 112 mEq/L    CO2  27  19 - 32 mEq/L    Glucose, Bld  95  70 - 99 mg/dL    BUN  14  6 - 23 mg/dL    Creatinine, Ser  0.95  0.50 - 1.35 mg/dL    Calcium  9.3  8.4 - 10.5 mg/dL    GFR calc non Af Amer  79 (*)  >90 mL/min    GFR calc Af Amer  >90  >90 mL/min    Comment:  (NOTE)     The eGFR has been calculated using the CKD EPI equation.     This calculation has not been validated in all clinical situations.     eGFR's persistently <90 mL/min signify possible Chronic Kidney     Disease.    Anion gap  11  5 - 15   CBC Status: Abnormal    Collection Time    09/30/13 6:25 AM   Result  Value  Ref Range    WBC  14.7 (*)  4.0 - 10.5 K/uL    RBC  3.84 (*)  4.22 - 5.81 MIL/uL    Hemoglobin  10.1 (*)  13.0 - 17.0 g/dL    HCT  32.2 (*)  39.0 - 52.0 %    MCV  83.9  78.0 - 100.0 fL    MCH  26.3  26.0 - 34.0 pg    MCHC  31.4  30.0 - 36.0 g/dL    RDW  17.0 (*)  11.5 - 15.5 %    Platelets  436 (*)  150 - 400 K/uL   BASIC METABOLIC PANEL Status: Abnormal    Collection Time    09/30/13 6:25 AM   Result  Value  Ref Range    Sodium  130 (*)  137 - 147 mEq/L    Potassium  4.0  3.7 - 5.3 mEq/L     Chloride  93 (*)  96 - 112 mEq/L    CO2  25  19 - 32 mEq/L    Glucose, Bld  102 (*)  70 - 99 mg/dL    BUN  15  6 - 23 mg/dL    Creatinine, Ser  1.03  0.50 - 1.35 mg/dL    Calcium  9.1  8.4 - 10.5 mg/dL    GFR calc non Af Amer  68 (*)  >90 mL/min    GFR calc Af Amer  79 (*)  >90 mL/min    Comment:  (NOTE)  The eGFR has been calculated using the CKD EPI equation.     This calculation has not been validated in all clinical situations.     eGFR's persistently <90 mL/min signify possible Chronic Kidney     Disease.    Anion gap  12  5 - 15    No results found.  Medical Problem List and Plan:  1. Functional deficits secondary to right cerebellar ICH.  2. DVT Prophylaxis/Anticoagulation: Subcutaneous heparin initiated 09/28/2013. Monitor for any bleeding episodes  3. Pain Management: Baclofen 5 mg 3 times a day, Hydrocodone as needed. Monitor with increased mobility  4. Dysphagia. Dysphagia 2 nectar liquids. Monitor for any signs of aspiration. Followup speech therapy  5. Neuropsych: This patient is capable of making decisions on his own behalf.  6. Skin/Wound Care: Routine skin checks  7. Hypertension. Lasix 40 mg daily, Imdur 60 mg daily, Cozaar 50 mg daily, Lopressor 100 mg twice a day.  8. History of coronary artery disease with cardiac arrest July 2015. No chest pain or shortness of breath. Patient on Plavix prior to admission currently on hold secondary to [place]. Remains on aspirin 81 mg daily. Followup cardiology Dr. Meda Coffee as needed  9. Hypothyroidism. Synthroid. Latest TSH level 4.173  10. UTI. Urine study 09/28/2013 positive nitrite many bacteria continue Bactrim for now  Post Admission Physician Evaluation:  1. Functional deficits secondary to right cerebellar ICH. 2. Patient is admitted to receive collaborative, interdisciplinary care between the physiatrist, rehab nursing staff, and therapy team. 3. Patient's level of medical complexity and substantial therapy needs in context of  that medical necessity cannot be provided at a lesser intensity of care such as a SNF. 4. Patient has experienced substantial functional loss from his/her baseline which was documented above under the "Functional History" and "Functional Status" headings. Judging by the patient's diagnosis, physical exam, and functional history, the patient has potential for functional progress which will result in measurable gains while on inpatient rehab. These gains will be of substantial and practical use upon discharge in facilitating mobility and self-care at the household level. 5. Physiatrist will provide 24 hour management of medical needs as well as oversight of the therapy plan/treatment and provide guidance as appropriate regarding the interaction of the two. 6. 24 hour rehab nursing will assist with bladder management, bowel management, safety, skin/wound care, disease management, medication administration, pain management and patient education and help integrate therapy concepts, techniques,education, etc. 7. PT will assess and treat for/with: Lower extremity strength, range of motion, stamina, balance, functional mobility, safety, adaptive techniques and equipment, NMR, vestibular assessment, stroke education, ego support, family ed. Goals are: mod I to supervision. 8. OT will assess and treat for/with: ADL's, functional mobility, safety, upper extremity strength, adaptive techniques and equipment, NMR, visual spatial awareness, vesitbular assessment, community reintegration, ego support, stroke ed. Goals are: supervision to mod I. 9. SLP will assess and treat for/with: swallowing and communication. Goals are: mod I. 10. Case Management and Social Worker will assess and treat for psychological issues and discharge planning. 11. Team conference will be held weekly to assess progress toward goals and to determine barriers to discharge. 12. Patient will receive at least 3 hours of therapy per day at least 5 days  per week. 13. ELOS: 14-16 days  14. Prognosis: excellent  Meredith Staggers, MD, [facility]   09/30/2013

## 2013-09-30 NOTE — Discharge Summary (Signed)
Physician Discharge Summary  Patient ID: Taylor Keller MRN: [id number] DOB/AGE: [birth year] 78 y.o.  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 Values. History of TIA in 2011. Coronary artery disease with cardiac arrest status post MI and stent placement x2 in July 2015. Congestive heart failure   Discharged Condition: fair  Hospital Course: Taylor Keller is a 78 y.o. male with a history of cardiac arrest in July who presents with headache that started earlier tonight. He states that it started around 9:30 pm 09/24/2013 and has been getting worse. In the ED, he was noted to have right sided weakness as well.  Of note, he had a vfib arrest in July following MI and has been recovering from this. He had two DES placed at that time to the LAD and left circumflex arteries.  Patient was not administered TPA secondary to hemorrhage. He was admitted to the neuro ICU for further evaluation and treatment. CT scan of the head showed a 4.1 x 2.6 cm right cerebellar hemorrhage with cytotoxic edema and extension into the fourth and lateral ventricles with phlegm layering of the posterior horn of the lateral ventricles with mild ventricle and the ganglion hydrocephalus. The patient had mild dysarthria and right-sided dysmetria. He however remained neurologically stable. Neurosurgery was consulted but patient was managed medically and did not require intraventricular catheter or neurosurgical clot evacuation. Follow up CT scan showed stable appearance. MRI scan of the brain confirmed a parenchymal cerebellar hematoma likely felt to be related to hypertension and being on dual antiplatelet therapy. No underlying mass effect was noted. He was seen in  consultation by Dr. Sherley Bounds from neurosurgery and Dr. Tawni Levy from rehabilitation and felt to be a good patient for inpatient rehabilitation. Physical occupational speech therapy also saw him and recommended inpatient rehabilitation. On the day of discharge his blood pressure adequately controlled and neurologically stable. He had mild right gaze nystagmus and psychiatric dysmetria with mild finger-to-nose dysmetria. Histology to inpatient rehabilitation dictation in the stable condition and. Patient's aspirin and Plavix were initially held and after 3-4 days aspirin was reintroduced with the plan to introduce Plavix after 2 weeks when a followup CT scan was planned and if there was no hyperdense blood on the CT scan and Plavix will be added. Urine drug screen was negative. Lipid profile was normal.     Consults: rehabilitation medicine & neurosurgery    Discharge Exam: Blood pressure 132/68, pulse 69, temperature 98.2 F (36.8 C), temperature source Oral, resp. rate 16, height 6' (1.829 m), weight 175 lb (79.379 kg), SpO2 99.00%. Mental Status -  Awake alert and interactive. Oriented to time place and person. Speech is slightly hesitant nonfluent but able to speak sentences. Good comprehension naming and repetition.  Cranial Nerves II - XII -  II - Visual field intact OU.  III, IV, VI - Extraocular movements intact, but had left eye gaze preference, horizontal nystagumus on right gaze with saccadic dysmetria.  V - Facial sensation intact bilaterally.  VII - Facial movement intact bilaterally.  VIII - Hearing & vestibular intact bilaterally.  X - Palate elevates symmetrically.  XI - Chin turning & shoulder shrug intact bilaterally.  XII - Tongue protrusion intact.  Motor Strength - The patient's strength was normal in all extremities and pronator drift was absent. Bulk was  normal and fasciculations were absent.  Motor Tone - Muscle tone was assessed at the neck and appendages and  was normal.  Reflexes - The patient's reflexes were normal in all extremities and he had no pathological reflexes.  Sensory - Light touch, temperature/pinprick were assessed and were normal.  Coordination - The patient had mild dysmetria on the right hand, Tremor was absent.  Gait and Station - not tested due to BP concern.   Disposition: Inpatient rehabilitation    Medication List         acetaminophen 325 MG tablet  Commonly known as:  TYLENOL  Take 2 tablets (650 mg total) by mouth every 4 (four) hours as needed for headache or mild pain.     albuterol (5 MG/ML) 0.5% nebulizer solution  Commonly known as:  PROVENTIL  Take 2.5 mg by nebulization every 6 (six) hours as needed for wheezing or shortness of breath.     aspirin 81 MG chewable tablet  Chew 81 mg by mouth daily.     atorvastatin 80 MG tablet  Commonly known as:  LIPITOR  Take 80 mg by mouth every evening.     clopidogrel 75 MG tablet  Commonly known as:  PLAVIX  Take 75 mg by mouth daily.     diphenhydrAMINE 25 MG tablet  Commonly known as:  BENADRYL  Take 25 mg by mouth every 6 (six) hours as needed.     furosemide 40 MG tablet  Commonly known as:  LASIX  Take 1 tablet (40 mg total) by mouth daily.     ipratropium 0.02 % nebulizer solution  Commonly known as:  ATROVENT  Take 0.5 mg by nebulization every 6 (six) hours as needed for wheezing or shortness of breath.     isosorbide mononitrate 60 MG 24 hr tablet  Commonly known as:  IMDUR  Take 60 mg by mouth daily.     lansoprazole 30 MG disintegrating tablet  Commonly known as:  PREVACID SOLUTAB  Take 30 mg by mouth daily.     levothyroxine 175 MCG tablet  Commonly known as:  SYNTHROID, LEVOTHROID  Take 175 mcg by mouth daily.     losartan 25 MG tablet  Commonly known as:  COZAAR  Take 1 tablet (25 mg total) by mouth daily.     metoprolol 50 MG tablet  Commonly known as:  LOPRESSOR  Take 50 mg by mouth 2 (two) times daily.     nitroGLYCERIN  0.4 MG SL tablet  Commonly known as:  NITROSTAT  Place 1 tablet (0.4 mg total) under the tongue every 5 (five) minutes as needed for chest pain.     zolpidem 5 MG tablet  Commonly known as:  AMBIEN  Take 1 tablet (5 mg total) by mouth at bedtime as needed for sleep.       Patient was advised to followup in the stroke clinic in 4 weeks with Dr. Erlinda Hong    Signed: Antony Contras 09/30/2013, 4:23 PM

## 2013-09-30 NOTE — Progress Notes (Signed)
Occupational Therapy Treatment Patient Details Name: Taylor Keller MRN: [id number] DOB: 04/11/1935 Today's Date: 09/30/2013    History of present illness 78 yo wm brought in via [place] for severe HA, N/V, & dizziness. Per pt the HA began around 2130 & cont to get worse. Pt with slight RUE pronation & drift, Rt facial droop, nystagmus, RLE sensory deficit. Imaging reveals cerebellar hemorrhage.   OT comments  Pt transferred from chair to sink level with (A) to ambulate and then exiting bathroom to doorway to recliner. Pt tolerated PT session and then OT session.pt sustained attention to task and completed sink level task sinking for 30 minutes. Pt demonstrates activity tolerance appropriate for CIR level therapy sessions.    Follow Up Recommendations  CIR;Supervision/Assistance - 24 hour    Equipment Recommendations  None recommended by OT    Recommendations for Other Services Rehab consult    Precautions / Restrictions Precautions Precautions: Fall Restrictions Weight Bearing Restrictions: No       Mobility Bed Mobility Overal bed mobility: Needs Assistance Bed Mobility: Supine to Sit     Supine to sit: Mod assist     General bed mobility comments: in chair  Transfers Overall transfer level: Needs assistance Equipment used: 2 person hand held assist Transfers: Sit to/from Stand Sit to Stand: +2 physical assistance;Mod assist Stand pivot transfers: Mod assist;+2 physical assistance       General transfer comment: pt provided hand held (A) to help advance ambulation     Balance Overall balance assessment: Needs assistance Sitting-balance support: Bilateral upper extremity supported;Feet supported Sitting balance-Leahy Scale: Fair Sitting balance - Comments: pt able to correct posture to midline with verbal/visual cues. Postural control: Other (comment) (multi plane LOB in standing. PT with left lean in chair) Standing balance support: Bilateral upper extremity  supported;During functional activity Standing balance-Leahy Scale: Poor Standing balance comment: pt with anterior lean and needing cues for upright posture                   ADL Overall ADL's : Needs assistance/impaired     Grooming: Wash/dry face;Oral care;Moderate assistance (shaving) Grooming Details (indicate cue type and reason): Pt using a electric razor, oral care and hand hygiene.                 Toilet Transfer: +2 for physical assistance;Moderate assistance           Functional mobility during ADLs: +2 for physical assistance;Maximal assistance General ADL Comments: Pt required cues for sequence for advancing gait and upright posture. pt with anterior weight shift and needing incr (A) with ambulation. pt cued for posture and able to self correct. pt unable to sustain personal corrections and needing cues again.pt ambulated ~10 ft. Pt leaning to the left and needing cues to lean to the right. Pt with ataxic RT UE movement. Pt required extensive (A) with electric razor due to Rt UE ataxic movement      Vision                 Additional Comments: pt with patch in room and not currently using. pt and wife again educated on patching Rt eye and left eye rotating every 2 hours. Wife reports "we were doing the letter on the stick. He said he could see it everywhere without blurr. THen we transferred here and we dont use it now"    Perception     Praxis      Cognition   Behavior During Therapy:  WFL for tasks assessed/performed Overall Cognitive Status: Impaired/Different from baseline Area of Impairment: Orientation;Memory;Following commands;Safety/judgement;Awareness;Problem solving Orientation Level: Disoriented to;Time   Memory: Decreased short-term memory  Following Commands: Follows one step commands with increased time Safety/Judgement: Decreased awareness of safety;Decreased awareness of deficits Awareness: Emergent Problem Solving: Slow  processing;Difficulty sequencing General Comments: Pt brushing teeth then brushing partial dentures. Pt stops and states "did I already brush my teeth?" Pt with tooth paste on mouth and able to view self in the mirror nad needs answer from therapist. Pt with poor problem solving    Extremity/Trunk Assessment               Exercises     Shoulder Instructions       General Comments      Pertinent Vitals/ Pain       Pain Assessment: No/denies pain Pain Score: 2  Pain Location: headache Pain Descriptors / Indicators: Aching Pain Intervention(s): Premedicated before session;Monitored during session  Home Living                                          Prior Functioning/Environment              Frequency Min 3X/week     Progress Toward Goals  OT Goals(current goals can now be found in the care plan section)  Progress towards OT goals: Progressing toward goals  Acute Rehab OT Goals Patient Stated Goal: to be able to take care of myself OT Goal Formulation: With patient Time For Goal Achievement: 10/10/13 Potential to Achieve Goals: Good ADL Goals Pt Will Perform Eating: with set-up;sitting Pt Will Perform Grooming: with set-up;sitting Pt Will Perform Upper Body Bathing: with set-up;sitting Pt Will Transfer to Toilet: with min guard assist;bedside commode;stand pivot transfer Pt Will Perform Toileting - Clothing Manipulation and hygiene: with supervision;sit to/from stand  Plan Discharge plan remains appropriate    Co-evaluation                 End of Session Equipment Utilized During Treatment: Gait belt   Activity Tolerance Patient tolerated treatment well   Patient Left in chair;with call bell/phone within reach;with family/visitor present   Nurse Communication Mobility status;Precautions        Time: 9470-9628 OT Time Calculation (min): 36 min  Charges: OT General Charges $OT Visit: 1 Procedure OT Treatments $Self  Care/Home Management : 23-37 mins  Peri Maris 09/30/2013, 1:52 PM Pager: [phone]

## 2013-09-30 NOTE — Progress Notes (Signed)
[facility] TEAM PROGRESS NOTE   HISTORY Taylor Keller Keller is a 78 y.o. male with a history of cardiac arrest in July who presents with headache that started earlier tonight. He states that it started around 9:30 pm 09/24/2013 and has been getting worse. In the ED, he was noted to have right sided weakness as well.  Of note, he had a vfib arrest in July following MI and has been recovering from this. He had two DES placed at that time to the LAD and left circumflex arteries.  Patient was not administered TPA secondary to hemorrhage. He was admitted to the neuro ICU for further evaluation and treatment.  SUBJECTIVE (INTERVAL HISTORY) Doing well today. No complaints. Awaiting transfer to rehabilitation. Blood pressure adequately controlled. Neurological he has remained stable.  OBJECTIVE Temp:  [97.3 F (36.3 C)-99 F (37.2 C)] 97.3 F (36.3 C) (08/25 1200) Pulse Rate:  [61-119] 119 (08/25 1200) Resp:  [13-20] 20 (08/25 1200) BP: (99-159)/(32-70) 99/32 mmHg (08/25 1200) SpO2:  [96 %-100 %] 100 % (08/25 1200)  BMET    Component Value Date/Time   NA 130* 09/30/2013 0625   K 4.0 09/30/2013 0625   CL 93* 09/30/2013 0625   CO2 25 09/30/2013 0625   GLUCOSE 102* 09/30/2013 0625   BUN 15 09/30/2013 0625   CREATININE 1.03 09/30/2013 0625   CREATININE 1.12 09/19/2013 1524   CALCIUM 9.1 09/30/2013 0625   GFRNONAA 68* 09/30/2013 0625   GFRNONAA 63 09/19/2013 1524   GFRAA 79* 09/30/2013 0625   GFRAA 73 09/19/2013 1524   CBC    Component Value Date/Time   WBC 14.7* 09/30/2013 0625   RBC 3.84* 09/30/2013 0625   HGB 10.1* 09/30/2013 0625   HCT 32.2* 09/30/2013 0625   PLT 436* 09/30/2013 0625   MCV 83.9 09/30/2013 0625   MCH 26.3 09/30/2013 0625   MCHC 31.4 09/30/2013 0625   RDW 17.0* 09/30/2013 0625   LYMPHSABS 0.8 09/25/2013 0045   MONOABS 0.7 09/25/2013 0045   EOSABS 0.1 09/25/2013 0045   BASOSABS 0.0 09/25/2013 0045   Lipid Panel     Component Value Date/Time   CHOL 99 05/13/2013 0833   TRIG 48 05/13/2013  0833   HDL 38* 05/13/2013 0833   CHOLHDL 2.6 05/13/2013 0833   VLDL 10 05/13/2013 0833   LDLCALC 51 05/13/2013 0833   Drugs of Abuse     Component Value Date/Time   LABOPIA NONE DETECTED 09/25/2013 0312   LABOPIA NEGATIVE 08/18/2008 0400   COCAINSCRNUR NONE DETECTED 09/25/2013 0312   COCAINSCRNUR NEGATIVE 08/18/2008 0400   LABBENZ NONE DETECTED 09/25/2013 0312   LABBENZ NEGATIVE 08/18/2008 0400   AMPHETMU NONE DETECTED 09/25/2013 0312   AMPHETMU NEGATIVE 08/18/2008 0400   THCU NONE DETECTED 09/25/2013 0312   LABBARB NONE DETECTED 09/25/2013 0312     PHYSICAL EXAM  General - Well nourished, well developed, in no apparent distress.  Ophthalmologic - not able to see through.  Cardiovascular - Regular rate and rhythm with no murmur.  Mental Status -  Awake alert and interactive. Oriented to time place and person. Speech is slightly hesitant nonfluent but able to speak sentences. Good comprehension naming and repetition. Cranial Nerves II - XII - II - Visual field intact OU. III, IV, VI - Extraocular movements intact, but had left eye gaze preference, horizontal nystagumus on right gaze with saccadic dysmetria. V - Facial sensation intact bilaterally. VII - Facial movement intact bilaterally. VIII - Hearing & vestibular intact bilaterally. X - Palate elevates symmetrically.  XI - Chin turning & shoulder shrug intact bilaterally. XII - Tongue protrusion intact.  Motor Strength - The patient's strength was normal in all extremities and pronator drift was absent.  Bulk was normal and fasciculations were absent.   Motor Tone - Muscle tone was assessed at the neck and appendages and was normal.  Reflexes - The patient's reflexes were normal in all extremities and he had no pathological reflexes.  Sensory - Light touch, temperature/pinprick were assessed and were normal.    Coordination - The patient had mild dysmetria on the right hand,    Tremor was absent.  Gait and Station - not tested due to  BP concern.   ASSESSMENT/PLAN  Taylor Keller is a 78 y.o. male with hx of vfib cardiac arrest, MI with stent placement in July of this year presenting with headache and right sided weakness.  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 Volume:  15 cc.  Antiplatelet discontinued. Cardiology on board. NSG on board. MRI 8/20 showed slightly enlargement of hematoma but not significant. No significant hydrocephalus comparing with CT 8/20. Repeat CT 8/21 showed some enlargement of cerebellar hematoma but no hydrocephalus. 0n 8/23/15CT showed decreased size of ICH and IVH but concerning for developing hydrocephalus. He had fall 09/28/13 am, seems had mental status change and delirium.   more lethargic and not fully orientated, concerning for hydrocephalus. Will close monitor and if necessary, repeat CT in pm and EVD if needed.  Taylor:  right cerebellar hemorrhage with IVH secondary to hypertension likely       CT showed decreased size of ICH and IVH but concerning for developing hydrocephalus. He had fall this am, seems had mental status change and delirium. Today more lethargic and not fully orientated, concerning for hydrocephalus. Will close monitor and if necessary, repeat CT in pm  Stable hence no need  forEVD  .   MRI brain 8/20 showed slightly enlargement of hematoma but no significant hydrocephalus comparing with CT overnight.  Repeat CT 8/21 showed some enlargement of cerebellar hematoma but no hydrocephalus.      aspirin 81 mg orally every day and clopidogrel 75 mg orally every day prior to admission due to recent stent, now start ASA 81mg  today as per NSG.  Agree with cardiology, would consider to resume plavix alone in about 2 weeks if bleeding is stable  Would hold off lipitor for now since it is debatable whether statin may worsening cerebral bleeding. Would consider to resume lipitor in about 7 days.  Cardiology input appreciated.   2D Echo  In July without  source of embolus   Carotid bilateral 40-59% stenosis, mild mixed plaques recently.   HgbA1c 6.0, troponin series negative, LDL 51 in 05/2013  On heparin subq for DVT prophylaxis; d/c SCDs   Dysphagia diet  Therapy needs: CIR  Risk factor management/education  Resultant headache  Now mild and resolving  Leukocytosis  - WBC trending downy - UA was negative on 8/20, on condom catheter, will repeat UA today. - daily CBC  Hypertension, accelerated   BP 180/81 on admission  Home meds:  Cozaar, lopressor, imdur, lasix.   Restart home meds for BP control.  Continue cozzar at 50mg  and Increased  lopressor to 100 mg bid  BP 99-186 past 24h  SBP goal < 160  Hyperlipidemia  LDL 51   Patient on lipitor 80 daily at home, not yet resumed in hospital   Would hold off lipitor for now since it is debatable  whether statin may worsening cerebral bleeding. Would consider to resume lipitor in about 7 days.  Hiccup - ICH related  - baclofen low dose for hiccup now stable  Other Taylor Risk Factors   ETOH use    Hx Taylor/TIA 2011   coronary artery disease with cardiac arrest, MI s/p stent placement x 2 08/2013. Placed on ASA and plavix at that time. Now off due to hemorrhage. Troponin series negative so far. Pt denies CP, SOB. Cardiology consult appreciated.  Other Pertinent History  Hx CHF post MI - resume lasix and [facility] day # 5 DC to inpatient rehab today.  D/W wife and answered questions.  Antony Contras, MD   To contact [facility] provider, please refer to [email or web address] After hours, contact General Neurology

## 2013-09-30 NOTE — Progress Notes (Signed)
Pt transferred to Rehab from 4N16. Pt and family orientated to Rehab expectations. All questions answered.

## 2013-09-30 NOTE — PMR Pre-admission (Signed)
PMR Admission Coordinator Pre-Admission Assessment  Patient: Taylor Keller is an 78 y.o., male MRN: [id number] DOB: Apr 09, 1935 Height: 6' (182.9 cm) Weight: 79.379 kg (175 lb)              Insurance Information HMO: yes    PPO:      PCP:      IPA:      80/20:      OTHER:  PRIMARY: [employer] Medicare HMO      Policy#: [id number]      Subscriber: self CM Name: Su Hoff, RN      Phone#: [phone], ext [phone]     Fax#: [phone].   Approval given for 7 days through 9-1 and updates due to [place] on 10-07-13. Pre-Cert#: [id number]      Employer: retired Benefits:  Phone #: [phone]     Name: automated Eff. Date: 02-06-13     Deduct: none      Out of Pocket Max: [phone] (met $2087.20)      Life Max: none CIR: $265 copay/day for days 1-6      SNF: $0/day for days 1-20; $140 copay/day for days 21-100 (100 day visit max) Outpatient: 100%     Co-Pay: $40 copay, based on medical necessity Home Health: 100%      Co-Pay: none DME: 80%     Co-Pay: 20% Providers: in network  Emergency Contact Information Contact Information   Name Relation Home Work [place] [phone]  [phone]   [place],Mike Other   [phone]     Current Medical History  Patient Admitting Diagnosis: Cerebellar ICH  History of Present Illness: Taylor Keller is a 78 y.o. right-handed male with history of CAD with cardiac arrest July 2015 and maintained on Plavix. CVA 2011 with little residual weakness. Independent prior to admission living with his wife. Presented 09/25/2013 with headache, nausea and vomiting as well as right-sided weakness. CT/MRI of the brain showed a 2.6 x 1.9 cm hematoma in the inferior right cerebellum. Neurosurgery consulted advise conservative care. Noted fall 09/27/2013 while in the hospital as patient attempted to get out of bed unassisted. A followup cranial CT scan completed showing interval decrease in size of right cerebellar hemorrhage with slightly increased  ventriculomegaly  involving the lateral and third ventricles. Maintained on a dysphagia 2 nectar thick liquid diet. Subcutaneous heparin initiated for DVT prophylaxis 09/28/2013. Physical therapy evaluation completed a 21 2015 with recommendations of physical medicine rehabilitation consult.  NIH Total: 2  Past Medical History  Past Medical History  Diagnosis Date  . Lumbar stenosis     L5  . Hypothyroidism   . Hyperlipidemia   . Hypertension   . ED (erectile dysfunction)   . Internal carotid artery stenosis 5/15    bilateral, 40-59%  . Myocardial infarction 08/2013  . Heart murmur   . Stroke ~ 2011    "slight; no permanent damage"    Family History  family history includes Colon cancer in his sister.  Prior Rehab/Hospitalizations: recent heart attack on 08-21-13 while in [place], came home and was supposed to start up with home health (but pt was making very good progress and did not need it).    Current Medications  Current facility-administered medications: stroke: mapping our early stages of recovery book, , Does not apply, Once, Roland Rack, MD;  acetaminophen (TYLENOL) tablet 650 mg, 650 mg, Oral, Q4H PRN, Roland Rack, MD, 650 mg at 09/27/13 1403;  aspirin EC tablet 81 mg, 81 mg, Oral,  Daily, Elaina Hoops, MD, 81 mg at 09/30/13 1120;  atorvastatin (LIPITOR) tablet 80 mg, 80 mg, Oral, QPM, Donzetta Starch, NP baclofen (LIORESAL) tablet 5 mg, 5 mg, Oral, TID, Rosalin Hawking, MD, 5 mg at 09/30/13 1120;  feeding supplement (ENSURE COMPLETE) (ENSURE COMPLETE) liquid 237 mL, 237 mL, Oral, BID BM, Dorann Ou, RD, 237 mL at 09/30/13 1305;  furosemide (LASIX) tablet 40 mg, 40 mg, Oral, Daily, Rosalin Hawking, MD, 40 mg at 09/30/13 1121;  heparin injection 5,000 Units, 5,000 Units, Subcutaneous, 3 times per day, Rosalin Hawking, MD, 5,000 Units at 09/30/13 1305 HYDROcodone-acetaminophen (NORCO/VICODIN) 5-325 MG per tablet 1-2 tablet, 1-2 tablet, Oral, Q4H PRN, Elaina Hoops, MD, 1 tablet at  09/30/13 1517;  isosorbide mononitrate (IMDUR) 24 hr tablet 60 mg, 60 mg, Oral, Daily, Rosalin Hawking, MD, 60 mg at 09/30/13 1120;  labetalol (NORMODYNE,TRANDATE) injection 10-40 mg, 10-40 mg, Intravenous, Q10 min PRN, Roland Rack, MD, 10 mg at 09/28/13 8527 levothyroxine (SYNTHROID, LEVOTHROID) tablet 175 mcg, 175 mcg, Oral, QAC breakfast, Roland Rack, MD, 175 mcg at 09/30/13 1116;  losartan (COZAAR) tablet 50 mg, 50 mg, Oral, Daily, Rosalin Hawking, MD, 50 mg at 09/30/13 1121;  metoprolol (LOPRESSOR) tablet 100 mg, 100 mg, Oral, BID, Rosalin Hawking, MD, 100 mg at 09/30/13 1121;  ondansetron [facility]) injection 4 mg, 4 mg, Intravenous, Q6H PRN, Eustace Moore, MD, 4 mg at 09/30/13 1513 pantoprazole (PROTONIX) EC tablet 40 mg, 40 mg, Oral, Daily, Rosalin Hawking, MD, 40 mg at 09/30/13 1121;  RESOURCE [place], , Oral, PRN, Rosalin Hawking, MD;  senna-docusate (Senokot-S) tablet 1 tablet, 1 tablet, Oral, BID, Roland Rack, MD, 1 tablet at 09/30/13 1121;  sulfamethoxazole-trimethoprim (BACTRIM) 400 mg in dextrose 5 % 500 mL IVPB, 400 mg, Intravenous, Q12H, Wallie Char, 400 mg at 09/30/13 1300  Patients Current Diet: General  Precautions / Restrictions Precautions Precautions: Fall Precaution Comments: c/o n/v Restrictions Weight Bearing Restrictions: No   Prior Activity Level Community (5-7x/wk): Pt was independent prior to recent heart attack in July. Pt was making good progress at home following his MI in July. Did so well that he did not need home health to start up.  Home Assistive Devices / Equipment Home Assistive Devices/Equipment: None Home Equipment: Walker - 2 wheels;Bedside commode  Prior Functional Level Prior Function Level of Independence: Independent Comments: retired Theme park manager  Current Functional Level Cognition  Arousal/Alertness: Lethargic Overall Cognitive Status: Impaired/Different from baseline Orientation Level: Oriented to person;Oriented to place;Oriented to  time Following Commands: Follows one step commands with increased time Safety/Judgement: Decreased awareness of safety;Decreased awareness of deficits General Comments: Pt brushing teeth then brushing partial dentures. Pt stops and states "did I already brush my teeth?" Pt with tooth paste on mouth and able to view self in the mirror nad needs answer from therapist. Pt with poor problem solving Attention: Selective Selective Attention: Impaired Selective Attention Impairment: Verbal complex Memory: Impaired Memory Impairment: Decreased short term memory;Decreased recall of new information (much improved with verbal cues and repetition) Decreased Short Term Memory: Verbal complex Awareness: Impaired Awareness Impairment: Intellectual impairment;Emergent impairment (pt developing intellectual and emergent awareness) Problem Solving:  (emerging problem solving skills regarding new impairment) Executive Function: Reasoning;Initiating;Self Monitoring;Self Correcting Reasoning: Appears intact Initiating: Appears intact Self Monitoring: Appears intact Self Correcting: Appears intact Safety/Judgment: Appears intact    Extremity Assessment (includes Sensation/Coordination)          ADLs  Overall ADL's : Needs assistance/impaired Eating/Feeding: Minimal assistance;Sitting Eating/Feeding Details (indicate cue type and  reason): dificulty controlling utensil and judging depth to self feed Grooming: Wash/dry face;Oral care;Moderate assistance (shaving) Grooming Details (indicate cue type and reason): Pt using a electric razor, oral care and hand hygiene. Upper Body Bathing: Moderate assistance;Sitting Lower Body Bathing: Maximal assistance;Sit to/from stand Upper Body Dressing : Moderate assistance;Sitting Lower Body Dressing: Maximal assistance;Sit to/from stand Toilet Transfer: +2 for physical assistance;Moderate assistance Toileting- Clothing Manipulation and Hygiene: Moderate assistance;Sit  to/from stand Functional mobility during ADLs: +2 for physical assistance;Maximal assistance General ADL Comments: Pt required cues for sequence for advancing gait and upright posture. pt with anterior weight shift and needing incr (A) with ambulation. pt cued for posture and able to self correct. pt unable to sustain personal corrections and needing cues again.pt ambulated ~10 ft. Pt leaning to the left and needing cues to lean to the right. Pt with ataxic RT UE movement. Pt required extensive (A) with electric razor due to Rt UE ataxic movement    Mobility  Overal bed mobility: Needs Assistance Bed Mobility: Supine to Sit Supine to sit: Mod assist General bed mobility comments: in chair    Transfers  Overall transfer level: Needs assistance Equipment used: 2 person hand held assist Transfers: Sit to/from Stand Sit to Stand: +2 physical assistance;Mod assist Stand pivot transfers: Mod assist;+2 physical assistance General transfer comment: pt provided hand held (A) to help advance ambulation     Ambulation / Gait / Stairs / Wheelchair Mobility  Ambulation/Gait Ambulation/Gait assistance: Mod assist;+2 physical assistance Ambulation Distance (Feet): 3 Feet Assistive device: Rolling walker (2 wheeled) Gait Pattern/deviations: Step-through pattern;Decreased stride length;Staggering right;Narrow base of support Gait velocity: decreased Gait velocity interpretation: Below normal speed for age/gender General Gait Details: pt unsteady and leans hard to R side.  pt with increased dizziness during ambulation.      Posture / Balance Dynamic Sitting Balance Sitting balance - Comments: pt able to correct posture to midline with verbal/visual cues.    Special needs/care consideration BiPAP/CPAP no  CPM no  Continuous Drip IV no  Dialysis no          Life Vest no  Oxygen no  Special Bed no  Trach Size no  Wound Vac (area) no        Skin - bump on L side of head from fall                                Bowel mgmt: last BM on 09-25-13 Bladder mgmt: no incontinence Diabetic mgmt no  Note: pt has been impulsive and demonstrates decreased insight per wife (pt did have a fall one night)    Previous Home Environment Living Arrangements: Spouse/significant other  Lives With: Spouse Available Help at Discharge: Family;Available 24 hours/day Type of Home: House Home Layout: Multi-level;Able to live on main level with bedroom/bathroom Home Access: Stairs to enter Entrance Stairs-Rails: Left Entrance Stairs-Number of Steps: 4 Bathroom Shower/Tub: Multimedia programmer: Standard Bathroom Accessibility: Yes How Accessible: Accessible via walker Isabel: No  Discharge Living Setting Plans for Discharge Living Setting: Patient's home Type of Home at Discharge: House Discharge Home Layout: Two level;Able to live on main level with bedroom/bathroom (pt will stay on mail floor) Discharge Home Access: Stairs to enter Entrance Stairs-Rails: Left Entrance Stairs-Number of Steps: 4 Does the patient have any problems obtaining your medications?: No  Social/Family/Support Systems 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 Information: wife Manuela Schwartz is  primary contact Anticipated Caregiver: wife Anticipated Caregiver's Contact Information: see above Ability/Limitations of Caregiver: wife is a Forensic psychologist and has a flexible schedule Caregiver Availability: 24/7 (wife states her real estate work is flexible) Discharge Plan Discussed with Primary Caregiver: Yes Is Caregiver In Agreement with Plan?: Yes Does Caregiver/Family have Issues with Lodging/Transportation while Pt is in Rehab?: No  Goals/Additional Needs Patient/Family Goal for Rehab: Supervision with PT/OT and Modified Ind and supervision with SLP Expected length of stay: 12-16 days Cultural Considerations: none Dietary Needs: regular diet Equipment Needs: to be  determined Pt/Family Agrees to Admission and willing to participate: Yes (spoke with pt's wife) Program Orientation Provided & Reviewed with Pt/Caregiver Including Roles  & Responsibilities: Yes   Decrease burden of Care through IP rehab admission: NA   Possible need for SNF placement upon discharge: not anticipated   Patient Condition: This patient's condition remains as documented in the consult dated 09-29-13, in which the Rehabilitation Physician determined and documented that the patient's condition is appropriate for intensive rehabilitative care in an inpatient rehabilitation facility. Will admit to inpatient rehab today.  Preadmission Screen Completed By:  Nanetta Batty, PT, 09/30/2013 4:00 PM ______________________________________________________________________   Discussed status with Dr. Naaman Plummer on 09-30-13 at 81 and received telephone approval for admission today.  Admission Coordinator:  Nanetta Batty, PT, time1600/Date8-25-15

## 2013-09-30 NOTE — Progress Notes (Signed)
Charted on the wrong patient .Taylor Keller

## 2013-09-30 NOTE — Progress Notes (Signed)
Rehab admissions - I met with pt and his wife in follow up to rehab MD consult to explain the possibility of inpatient rehab. Questions were answered and informational brochures were given. Pt and his wife are interested in pursuing inpatient rehab.  I will now open the case with [employer] to seek insurance authorization. I will keep the patient, family and medical team aware of any updates.  Please call me with any questions. Thanks.  Nanetta Batty, PT Rehabilitation Admissions Coordinator [phone]

## 2013-10-01 ENCOUNTER — Inpatient Hospital Stay (HOSPITAL_COMMUNITY): Payer: Medicare HMO | Admitting: Occupational Therapy

## 2013-10-01 ENCOUNTER — Inpatient Hospital Stay (HOSPITAL_COMMUNITY): Payer: Medicare HMO | Admitting: Rehabilitation

## 2013-10-01 ENCOUNTER — Ambulatory Visit (HOSPITAL_COMMUNITY): Payer: Medicare HMO

## 2013-10-01 ENCOUNTER — Inpatient Hospital Stay (HOSPITAL_COMMUNITY): Payer: Medicare HMO | Admitting: Speech Pathology

## 2013-10-01 LAB — COMPREHENSIVE METABOLIC PANEL
ALBUMIN: 2.5 g/dL — AB (ref 3.5–5.2)
ALK PHOS: 71 U/L (ref 39–117)
ALT: 10 U/L (ref 0–53)
ANION GAP: 15 (ref 5–15)
AST: 11 U/L (ref 0–37)
BUN: 14 mg/dL (ref 6–23)
CO2: 24 mEq/L (ref 19–32)
Calcium: 9.1 mg/dL (ref 8.4–10.5)
Chloride: 94 mEq/L — ABNORMAL LOW (ref 96–112)
Creatinine, Ser: 1.11 mg/dL (ref 0.50–1.35)
GFR calc Af Amer: 73 mL/min — ABNORMAL LOW (ref >90)
GFR calc non Af Amer: 63 mL/min — ABNORMAL LOW (ref >90)
Glucose, Bld: 117 mg/dL — ABNORMAL HIGH (ref 70–99)
POTASSIUM: 4.1 meq/L (ref 3.7–5.3)
Sodium: 133 mEq/L — ABNORMAL LOW (ref 137–147)
TOTAL PROTEIN: 7.6 g/dL (ref 6.0–8.3)
Total Bilirubin: 0.2 mg/dL — ABNORMAL LOW (ref 0.3–1.2)

## 2013-10-01 LAB — CBC WITH DIFFERENTIAL/PLATELET
BASOS ABS: 0 10*3/uL (ref 0.0–0.1)
BASOS PCT: 0 % (ref 0–1)
EOS ABS: 0.2 10*3/uL (ref 0.0–0.7)
Eosinophils Relative: 2 % (ref 0–5)
HCT: 31.5 % — ABNORMAL LOW (ref 39.0–52.0)
Hemoglobin: 10.2 g/dL — ABNORMAL LOW (ref 13.0–17.0)
Lymphocytes Relative: 10 % — ABNORMAL LOW (ref 12–46)
Lymphs Abs: 1.1 10*3/uL (ref 0.7–4.0)
MCH: 26.9 pg (ref 26.0–34.0)
MCHC: 32.4 g/dL (ref 30.0–36.0)
MCV: 83.1 fL (ref 78.0–100.0)
MONOS PCT: 9 % (ref 3–12)
Monocytes Absolute: 1 10*3/uL (ref 0.1–1.0)
NEUTROS ABS: 8.5 10*3/uL — AB (ref 1.7–7.7)
NEUTROS PCT: 79 % — AB (ref 43–77)
PLATELETS: 452 10*3/uL — AB (ref 150–400)
RBC: 3.79 MIL/uL — ABNORMAL LOW (ref 4.22–5.81)
RDW: 16.9 % — AB (ref 11.5–15.5)
WBC: 10.7 10*3/uL — ABNORMAL HIGH (ref 4.0–10.5)

## 2013-10-01 MED ORDER — SULFAMETHOXAZOLE-TMP DS 800-160 MG PO TABS
1.0000 | ORAL_TABLET | Freq: Two times a day (BID) | ORAL | Status: DC
Start: 2013-10-01 — End: 2013-10-06
  Administered 2013-10-01 – 2013-10-05 (×9): 1 via ORAL
  Filled 2013-10-01 (×13): qty 1

## 2013-10-01 MED ORDER — BOOST / RESOURCE BREEZE PO LIQD
1.0000 | Freq: Three times a day (TID) | ORAL | Status: DC
  Administered 2013-10-01 – 2013-10-06 (×7): 1 via ORAL

## 2013-10-01 MED ORDER — ONDANSETRON HCL 4 MG PO TABS
4.0000 mg | ORAL_TABLET | Freq: Three times a day (TID) | ORAL | Status: DC
Start: 2013-10-01 — End: 2013-10-07
  Administered 2013-10-01 – 2013-10-07 (×16): 4 mg via ORAL
  Filled 2013-10-01 (×26): qty 1

## 2013-10-01 MED ORDER — BISACODYL 10 MG RE SUPP
10.0000 mg | Freq: Every day | RECTAL | Status: DC | PRN
  Administered 2013-10-01 – 2013-10-06 (×2): 10 mg via RECTAL
  Filled 2013-10-01 (×2): qty 1

## 2013-10-01 NOTE — Progress Notes (Signed)
Social Work Assessment and Plan  Patient Details  Name: Taylor Keller MRN: [id number] Date of Birth: [birth year]  Today's Date: 10/01/2013  Problem List:  Patient Active Problem List   Diagnosis Date Noted  . ICH (intracerebral hemorrhage) 09/25/2013  . Chest pain at rest 09/08/2013  . H/O cardiac arrest July 2015 09/08/2013  . CAD- s/p LAD and CFX stent July 2015 09/08/2013  . Acute CHF 09/08/2013  . Internal carotid artery stenosis   . Hypothyroidism   . Hyperlipidemia   . Hypertension   . ED (erectile dysfunction)    Past Medical History:  Past Medical History  Diagnosis Date  . Lumbar stenosis     L5  . Hypothyroidism   . Hyperlipidemia   . Hypertension   . ED (erectile dysfunction)   . Internal carotid artery stenosis 5/15    bilateral, 40-59%  . Myocardial infarction 08/2013  . Heart murmur   . Stroke ~ 2011    "slight; no permanent damage"   Past Surgical History:  Past Surgical History  Procedure Laterality Date  . Tonsillectomy and adenoidectomy    . Appendectomy    . Coronary angioplasty with stent placement  08/2013    "2"  . Cataract extraction, bilateral Bilateral ~ 2012   Social History:  reports that he has never smoked. He has never used smokeless tobacco. He reports that he drinks alcohol. He reports that he does not use illicit drugs.  Family / Support Systems Marital Status: Married How Long?: 25 years Patient Roles: Spouse;Parent;Other (Comment) (interim pastor as needed) Spouse/Significant Other: Jacobe Study - wife - [phone] Children: Joanna Puff - son in [place] [phone] Other Supports: 4 other children between the couple - some out of state and one lives in [place] Anticipated Caregiver: wife Ability/Limitations of Caregiver: wife is a Forensic psychologist and has a flexible schedule Caregiver Availability: 24/7 Family Dynamics: close, supportive wife  Social History Preferred language: English Religion:  Baptist 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  Pt has worked through that and is not feeling that way now. He knows that we are here to support him and can get him resources, as needed.  Pt is hopeful for recovery.   Recent Psychosocial Issues: None reported Psychiatric History: None reported Substance Abuse History: None reported  Patient / Family Perceptions, Expectations & Goals Pt/Family understanding of illness & functional limitations: Pt and wife report a good understanding of pt's condition. Premorbid pt/family roles/activities: Pt likes to read, walk, fish, and travel. Anticipated changes in roles/activities/participation: Pt realizes it may take some time to get back to some of the activities he was doing prior to his cardiac arrest and stroke. Pt/family expectations/goals: Pt wants to get back on his feet and regain his independence.  Community Resources Express Scripts: None Premorbid Home Care/DME Agencies: None Transportation available at discharge: wife Resource referrals recommended: Support group (specify)  Discharge Planning Living Arrangements: Spouse/significant other Support Systems: Spouse/significant other;Children;Friends/neighbors Type of Residence: Private residence [employer] Resources: Chartered certified accountant Resources: Fish farm manager;Family Support (wife is still working as a Cabin crew) Museum/gallery curator Screen Referred: No Living Expenses: Own Money Management:  Patient;Spouse  Does the patient have any problems obtaining your medications?: No Home Management: Wife can manage the home or have people help her, as needed. Patient/Family Preliminary Plans: Pt plans to return to his home with his wife and paid caregivers (as needed) to provide 24/7 supervision. Barriers to Discharge: Steps Social Work Anticipated Follow Up Needs: HH/OP;Support Group Expected length of stay: 3 weeks  Clinical Impression CSW met with pt and his wife to introduce self and role of CSW, as well as complete assessment.  Pt was tired during visit, but was able to participate for most of it.  Pt's wife works as a Cabin crew and is able to do some work from home, unless she is showing property.  She plans to hire private duty caregivers to be with pt when she needs to leave the home.  CSW will give her the private duty list.  Pt reported feeling "worthless" on two occasions, but feels he has worked through these feelings and is feeling better emotionally.  CSW made pt and wife aware of supports/resources available to him, should pt begin to feel poorly emotionally.  They both expressed understanding.  They feel pt has adjusted to CIR and they have a good understanding of how the unit operates. Pt's wife is interested in pt still going for cardiopulmonary rehab after his CIR stay.  CSW explained that he would most likely need to fully complete his physical rehab prior to being ready for that.  This may not occur until pt is home and finished with HH/outpatient therapy. CSW will continue to follow and assist as needed.  Hamsa Laurich, Silvestre Mesi 10/01/2013, 11:55 AM

## 2013-10-01 NOTE — Progress Notes (Signed)
Patient information reviewed and entered into eRehab system by Skylinn Vialpando, RN, CRRN, PPS Coordinator.  Information including medical coding and functional independence measure will be reviewed and updated through discharge.     Per nursing patient was given "Data Collection Information Summary for Patients in Inpatient Rehabilitation Facilities with attached "Privacy Act Statement-Health Care Records" upon admission.  

## 2013-10-01 NOTE — Evaluation (Signed)
Occupational Therapy Assessment and Plan  Patient Details  Name: Taylor Keller MRN: [id number] Date of Birth: 1935-10-08  OT Diagnosis: ataxia, cognitive deficits, disturbance of vision and hemiplegia affecting dominant side Rehab Potential: Rehab Potential: Good ELOS: 3 weeks   Today's Date: 10/01/2013 OT Individual Time: 1350-1450 OT Individual Time Calculation (min): 60 min    Problem List:  Patient Active Problem List   Diagnosis Date Noted  . ICH (intracerebral hemorrhage) 09/25/2013  . Chest pain at rest 09/08/2013  . H/O cardiac arrest July 2015 09/08/2013  . CAD- s/p LAD and CFX stent July 2015 09/08/2013  . Acute CHF 09/08/2013  . Internal carotid artery stenosis   . Hypothyroidism   . Hyperlipidemia   . Hypertension   . ED (erectile dysfunction)     Past Medical History:  Past Medical History  Diagnosis Date  . Lumbar stenosis     L5  . Hypothyroidism   . Hyperlipidemia   . Hypertension   . ED (erectile dysfunction)   . Internal carotid artery stenosis 5/15    bilateral, 40-59%  . Myocardial infarction 08/2013  . Heart murmur   . Stroke ~ 2011    "slight; no permanent damage"   Past Surgical History:  Past Surgical History  Procedure Laterality Date  . Tonsillectomy and adenoidectomy    . Appendectomy    . Coronary angioplasty with stent placement  08/2013    "2"  . Cataract extraction, bilateral Bilateral ~ 2012    Assessment & Plan Clinical Impression: Patient is a 78 y.o. right-handed male with history of CAD with cardiac arrest July 2015 and maintained on Plavix. CVA 2011 with little residual weakness. Independent prior to admission living with his wife. Presented 09/25/2013 with headache, nausea and vomiting as well as right-sided weakness. CT/MRI of the brain showed a 2.6 x 1.9 cm hematoma in the inferior right cerebellum. Neurosurgery consulted advise conservative care. Noted fall 09/27/2013 while in the hospital as patient attempted to get  out of bed unassisted. A followup cranial CT scan completed showing interval decrease in size of right cerebellar hemorrhage with slightly increased ventriculomegaly involving the lateral and third ventricles. Maintained on a dysphagia 2 nectar thick liquid diet. Subcutaneous heparin initiated for DVT prophylaxis 09/28/2013. Physical therapy evaluation completed a 21 2015 with recommendations of physical medicine rehabilitation consult.   Patient transferred to CIR on 09/30/2013 .    Patient currently requires mod with basic self-care skills secondary to muscle weakness, impaired timing and sequencing, decreased coordination and decreased motor planning, decreased visual perceptual skills and decreased visual motor skills, decreased attention, decreased awareness, decreased problem solving and decreased memory and central origin.  Prior to hospitalization, patient could complete ADLs with independent .  Patient will benefit from skilled intervention to increase independence with basic self-care skills prior to discharge home with care partner.  Anticipate patient will require 24 hour supervision and follow up home health vs follow up outpatient.  OT - End of Session Activity Tolerance: Tolerates 30+ min activity with multiple rests Endurance Deficit: Yes Endurance Deficit Description: Pt notably fatigued at end of session.  OT Assessment Rehab Potential: Good OT Patient demonstrates impairments in the following area(s): Balance;Cognition;Endurance;Motor;Safety;Vision OT Basic ADL's Functional Problem(s): Grooming;Bathing;Dressing;Toileting OT Transfers Functional Problem(s): Toilet;Tub/Shower OT Plan OT Intensity: Minimum of 1-2 x/day, 45 to 90 minutes OT Frequency: 5 out of 7 days OT Duration/Estimated Length of Stay: 3 weeks OT Treatment/Interventions: Balance/vestibular training;Cognitive remediation/compensation;Community reintegration;Discharge planning;Disease  mangement/prevention;DME/adaptive equipment instruction;Functional mobility training;Neuromuscular re-education;Pain  management;Patient/family education;Psychosocial support;Self Care/advanced ADL retraining;Therapeutic Activities;Therapeutic Exercise;UE/LE Strength taining/ROM;UE/LE Coordination activities;Visual/perceptual remediation/compensation OT Self Feeding Anticipated Outcome(s): independent OT Basic Self-Care Anticipated Outcome(s): supervision OT Toileting Anticipated Outcome(s): supervision OT Bathroom Transfers Anticipated Outcome(s): supervision OT Recommendation Patient destination: Home Follow Up Recommendations: Outpatient OT Equipment Recommended: Tub/shower seat Equipment Details: reports has a BSC   Skilled Therapeutic Intervention OT eval initiated with education on OT goals, purpose and role discussed. Pt in bed upon arrival, completed supine to sit with min assist due to c/o nausea and dizziness with movement.  Pt with Lt lateral lean in sitting at EOB.  Completed stand pivot transfer to w/c with mod assist due to decreased balance reactions.  Engaged in bathing at sink per pt's request with focus on sitting and standing balance.  Pt reports increase in nausea and dizziness when bending forward to wash BLE. Stand pivot mod assist to [facility] mod assist, sit > stand from [facility] min assist while RN placed suppository.  Vision assessed while seated on BSC, noted nystagmus in both eyes when scanning to Rt and decreased coordination of eye movement in all directions.    OT Evaluation Precautions/Restrictions  Precautions Precautions: Fall Precaution Comments: dizziness w/ all movement, nystagmus w/ R gaze, uncoordinated movements General   Vital Signs Therapy Vitals Temp: 98.1 F (36.7 C) Temp src: Oral Pulse Rate: 64 Resp: 18 BP: 138/59 mmHg Patient Position (if appropriate): Lying Oxygen Therapy SpO2: 98 % O2 Device: None (Room air) Pain Pain Assessment Pain  Assessment: No/denies pain Home Living/Prior Functioning Home Living Living Arrangements: Spouse/significant other Available Help at Discharge: Family;Available 24 hours/day Type of Home: House Home Access: Stairs to enter [employer] of Steps: 4 Entrance Stairs-Rails: Left Home Layout: Able to live on main level with bedroom/bathroom  Lives With: Spouse Prior Function Level of Independence: Independent with basic ADLs;Independent with gait;Independent with transfers  Able to Take Stairs?: Yes Driving: Yes (had just started driving again a few days prior to stroke) Vocation: Retired Leisure: Hobbies-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 ADL ADL ADL Comments: See FIM Vision/Perception  Vision- History Baseline Vision/History: Wears glasses Wears Glasses: Reading only Patient Visual Report: Nausea/blurring vision with head movement Vision- Assessment Vision Assessment?: Yes Eye Alignment: Within Functional Limits Ocular Range of Motion: Within Functional Limits Alignment/Gaze Preference: Head turned (slightly to the Rt) Tracking/Visual Pursuits: Decreased smoothness of horizontal tracking;Decreased smoothness of vertical tracking Saccades: Decreased speed of saccadic movement;Additional eye shifts occurred during testing Convergence: Within functional limits Visual Fields: No apparent deficits Depth Perception: Overshoots  Cognition Overall Cognitive Status: Impaired/Different from baseline Arousal/Alertness: Lethargic Orientation Level: Oriented X4 Attention: Selective Selective Attention: Impaired Selective Attention Impairment: Functional basic Memory: Impaired Memory Impairment: Decreased recall of new information;Decreased short term memory Decreased Short Term Memory: Functional basic Awareness: Impaired Awareness Impairment: Emergent impairment Safety/Judgment: Appears intact Comments: Pt did not demonstrate any impulsivity during session   Sensation Sensation Light Touch: Appears Intact Proprioception: Appears Intact Coordination Gross Motor Movements are Fluid and Coordinated: No Fine Motor Movements are Fluid and Coordinated: No Coordination and Movement Description: mild decreased coordination in RUE gross motor > fine motor Motor  Motor Motor: Hemiplegia;Abnormal postural alignment and control;Other (comment) Motor - Skilled Clinical Observations: Pt presents with decreased postural control in sitting/standing, decreased balance, decreased coordination in RUE/LE (more uncoordination rather than strength deficits).  Extremity/Trunk Assessment RUE Assessment RUE Assessment: Within Functional Limits (strength grossly 5/5) LUE Assessment LUE Assessment: Within Functional Limits (strength grossly 5/5)  FIM:  FIM - Bathing Bathing Steps  Patient Completed: Chest;Right Arm;Left Arm;Abdomen;Front perineal area;Right upper leg;Left upper leg Bathing: 3: Mod-Patient completes 5-7 64f10 parts or 50-74% FIM - Upper Body Dressing/Undressing Upper body dressing/undressing: 0: Wears gown/pajamas-no public clothing FIM - Lower Body Dressing/Undressing Lower body dressing/undressing: 0: Wears gInterior and spatial designerFIM - BIT sales professionalTransfer: 4: Supine > Sit: Min A (steadying Pt. > 75%/lift 1 leg);3: Bed > Chair or W/C: Mod A (lift or lower assist) FIM - TRadio producerDevices: BRecruitment consultantTransfers: 3-To toilet/BSC: Mod A (lift or lower assist)   Refer to Care Plan for Long Term Goals  Recommendations for other services: None  Discharge Criteria: Patient will be discharged from OT if patient refuses treatment 3 consecutive times without medical reason, if treatment goals not met, if there is a change in medical status, if patient makes no progress towards goals or if patient is discharged from hospital.  The above assessment, treatment plan, treatment  alternatives and goals were discussed and mutually agreed upon: by patient and by family  HEllwood DenseSCenter For Bone And Joint Surgery Dba [facility]8/26/2015, 3:16 PM

## 2013-10-01 NOTE — Progress Notes (Signed)
Patient transferred to rehab with spouse at patient side. Report given to receiving nurse. Patient and spouse stated understanding of instructions given. Aisha RN

## 2013-10-01 NOTE — Progress Notes (Signed)
Inpatient [facility] Statement of Services  Patient Name:  Taylor Keller  Date:  10/01/2013  Welcome to the [facility].  Our goal is to provide you with an individualized program based on your diagnosis and situation, designed to meet your specific needs.  With this comprehensive rehabilitation program, you will be expected to participate in at least 3 hours of rehabilitation therapies Monday-Friday, with modified therapy programming on the weekends.  Your rehabilitation program will include the following services:  Physical Therapy (PT), Occupational Therapy (OT), Speech Therapy (ST), 24 hour per day rehabilitation nursing, Case Management (Social Worker), Rehabilitation Medicine, Nutrition Services and Pharmacy Services  Weekly team conferences will be held on Wednesdays to discuss your progress.  Your Social Worker will talk with you frequently to get your input and to update you on team discussions.  Team conferences with you and your family in attendance may also be held.  Expected length of stay:  3 weeks  Overall anticipated outcome:  Supervision with minimal assistance with stairs  Depending on your progress and recovery, your program may change. Your Social Worker will coordinate services and will keep you informed of any changes. Your Social Worker's name and contact numbers are listed  below.  The following services may also be recommended but are not provided by the [facility] will be made to provide these services after discharge if needed.  Arrangements include referral to agencies that provide these services.  Your insurance has been verified to be:  [employer] Your primary doctor is:  Dr. Jenna Luo  Pertinent information will be shared with your doctor and your insurance  company.  Social Worker:  Alfonse Alpers, LCSW  [phone] or ([phone]  Information discussed with and copy given to patient by: Trey Sailors, 10/01/2013, 11:32 AM

## 2013-10-01 NOTE — Plan of Care (Signed)
Problem: RH BLADDER ELIMINATION Goal: RH STG MANAGE BLADDER WITH ASSISTANCE STG Manage Bladder With Min Assistance  Outcome: Not Progressing Condom catheter in use. Discussed timed toileting with pt and wife with pt wanting to use condom cath at this time

## 2013-10-01 NOTE — Progress Notes (Signed)
[facility] Admission Coordinator Signed Physical Medicine and Rehabilitation PMR Pre-admission Service date: 09/30/2013 3:23 PM  Related encounter: Admission (Discharged) from 09/25/2013 in [facility]   PMR Admission Coordinator Pre-Admission Assessment  Patient: Taylor Keller is an 78 y.o., male  MRN: [id number]  DOB: March 06, 1935  Height: 6' (182.9 cm)  Weight: 79.379 kg (175 lb)  Insurance Information  HMO: yes PPO: PCP: IPA: 80/20: OTHER:  PRIMARY: [employer] Medicare HMO Policy#: [id number] Subscriber: self  CM Name: Su Hoff, RN Phone#: [phone], ext [phone] Fax#: [phone]. Approval given for 7 days through 9-1 and updates due to [place] on 10-07-13.  Pre-Cert#: [id number] Employer: retired  Benefits: Phone #: [phone] Name: automated  Eff. Date: 02-06-13 Deduct: none Out of Pocket Max: [phone] (met $2087.20) Life Max: none  CIR: $265 copay/day for days 1-6 SNF: $0/day for days 1-20; $140 copay/day for days 21-100 (100 day visit max)  Outpatient: 100% Co-Pay: $40 copay, based on medical necessity  Home Health: 100% Co-Pay: none  DME: 80% Co-Pay: 20%  Providers: in network   Emergency Contact Information    Contact Information     Name  Relation  Home  Work  [place]  [phone]   [phone]     [place],Mike  Other    [phone]        Current Medical History  Patient Admitting Diagnosis: Cerebellar ICH  History of Present Illness: Taylor Keller is a 78 y.o. right-handed male with history of CAD with cardiac arrest July 2015 and maintained on Plavix. CVA 2011 with little residual weakness. Independent prior to admission living with his wife. Presented 09/25/2013 with headache, nausea and vomiting as well as right-sided weakness. CT/MRI of the brain showed a 2.6 x 1.9 cm hematoma in the inferior right cerebellum. Neurosurgery consulted advise conservative care. Noted fall 09/27/2013  while in the hospital as patient attempted to get out of bed unassisted. A followup cranial CT scan completed showing interval decrease in size of right cerebellar hemorrhage with slightly increased ventriculomegaly involving the lateral and third ventricles. Maintained on a dysphagia 2 nectar thick liquid diet. Subcutaneous heparin initiated for DVT prophylaxis 09/28/2013. Physical therapy evaluation completed a 21 2015 with recommendations of physical medicine rehabilitation consult.  NIH Total: 2  Past Medical History    Past Medical History    Diagnosis  Date    .  Lumbar stenosis       L5    .  Hypothyroidism     .  Hyperlipidemia     .  Hypertension     .  ED (erectile dysfunction)     .  Internal carotid artery stenosis  5/15      bilateral, 40-59%    .  Myocardial infarction  08/2013    .  Heart murmur     .  Stroke  ~ 2011      "slight; no permanent damage"     Family History  family history includes Colon cancer in his sister.  Prior Rehab/Hospitalizations: recent heart attack on 08-21-13 while in [place], came home and was supposed to start up with home health (but pt was making very good progress and did not need it).  Current Medications  Current facility-administered medications: stroke: mapping our early stages of recovery book, , Does not apply, Once, Roland Rack, MD; acetaminophen (TYLENOL) tablet 650 mg, 650 mg, Oral, Q4H PRN, Roland Rack, MD, 650 mg at  09/27/13 1403; aspirin EC tablet 81 mg, 81 mg, Oral, Daily, Elaina Hoops, MD, 81 mg at 09/30/13 1120; atorvastatin (LIPITOR) tablet 80 mg, 80 mg, Oral, QPM, Donzetta Starch, NP  baclofen (LIORESAL) tablet 5 mg, 5 mg, Oral, TID, Rosalin Hawking, MD, 5 mg at 09/30/13 1120; feeding supplement (ENSURE COMPLETE) (ENSURE COMPLETE) liquid 237 mL, 237 mL, Oral, BID BM, Dorann Ou, RD, 237 mL at 09/30/13 1305; furosemide (LASIX) tablet 40 mg, 40 mg, Oral, Daily, Rosalin Hawking, MD, 40 mg at 09/30/13 1121; heparin injection 5,000  Units, 5,000 Units, Subcutaneous, 3 times per day, Rosalin Hawking, MD, 5,000 Units at 09/30/13 1305  HYDROcodone-acetaminophen (NORCO/VICODIN) 5-325 MG per tablet 1-2 tablet, 1-2 tablet, Oral, Q4H PRN, Elaina Hoops, MD, 1 tablet at 09/30/13 1517; isosorbide mononitrate (IMDUR) 24 hr tablet 60 mg, 60 mg, Oral, Daily, Rosalin Hawking, MD, 60 mg at 09/30/13 1120; labetalol (NORMODYNE,TRANDATE) injection 10-40 mg, 10-40 mg, Intravenous, Q10 min PRN, Roland Rack, MD, 10 mg at 09/28/13 7209  levothyroxine (SYNTHROID, LEVOTHROID) tablet 175 mcg, 175 mcg, Oral, QAC breakfast, Roland Rack, MD, 175 mcg at 09/30/13 1116; losartan (COZAAR) tablet 50 mg, 50 mg, Oral, Daily, Rosalin Hawking, MD, 50 mg at 09/30/13 1121; metoprolol (LOPRESSOR) tablet 100 mg, 100 mg, Oral, BID, Rosalin Hawking, MD, 100 mg at 09/30/13 1121; ondansetron [facility]) injection 4 mg, 4 mg, Intravenous, Q6H PRN, Eustace Moore, MD, 4 mg at 09/30/13 1513  pantoprazole (PROTONIX) EC tablet 40 mg, 40 mg, Oral, Daily, Rosalin Hawking, MD, 40 mg at 09/30/13 1121; [place], , Oral, PRN, Rosalin Hawking, MD; senna-docusate (Senokot-S) tablet 1 tablet, 1 tablet, Oral, BID, Roland Rack, MD, 1 tablet at 09/30/13 1121; sulfamethoxazole-trimethoprim (BACTRIM) 400 mg in dextrose 5 % 500 mL IVPB, 400 mg, Intravenous, Q12H, Wallie Char, 400 mg at 09/30/13 1300  Patients Current Diet: General  Precautions / Restrictions  Precautions  Precautions: Fall  Precaution Comments: c/o n/v  Restrictions  Weight Bearing Restrictions: No  Prior Activity Level  Community (5-7x/wk): Pt was independent prior to recent heart attack in July. Pt was making good progress at home following his MI in July. Did so well that he did not need home health to start up.  Home Assistive Devices / Equipment  Home Assistive Devices/Equipment: None  Home Equipment: Walker - 2 wheels;Bedside commode  Prior Functional Level  Prior Function  Level of Independence: Independent   Comments: retired Theme park manager  Current Functional Level    Cognition  Arousal/Alertness: Lethargic  Overall Cognitive Status: Impaired/Different from baseline  Orientation Level: Oriented to person;Oriented to place;Oriented to time  Following Commands: Follows one step commands with increased time  Safety/Judgement: Decreased awareness of safety;Decreased awareness of deficits  General Comments: Pt brushing teeth then brushing partial dentures. Pt stops and states "did I already brush my teeth?" Pt with tooth paste on mouth and able to view self in the mirror nad needs answer from therapist. Pt with poor problem solving  Attention: Selective  Selective Attention: Impaired  Selective Attention Impairment: Verbal complex  Memory: Impaired  Memory Impairment: Decreased short term memory;Decreased recall of new information (much improved with verbal cues and repetition)  Decreased Short Term Memory: Verbal complex  Awareness: Impaired  Awareness Impairment: Intellectual impairment;Emergent impairment (pt developing intellectual and emergent awareness)  Problem Solving: (emerging problem solving skills regarding new impairment)  Executive Function: Reasoning;Initiating;Self Monitoring;Self Correcting  Reasoning: Appears intact  Initiating: Appears intact  Self Monitoring: Appears intact  Self Correcting: Appears intact  Safety/Judgment:  Appears intact    Extremity Assessment  (includes Sensation/Coordination)      ADLs  Overall ADL's : Needs assistance/impaired  Eating/Feeding: Minimal assistance;Sitting  Eating/Feeding Details (indicate cue type and reason): dificulty controlling utensil and judging depth to self feed  Grooming: Wash/dry face;Oral care;Moderate assistance (shaving)  Grooming Details (indicate cue type and reason): Pt using a electric razor, oral care and hand hygiene.  Upper Body Bathing: Moderate assistance;Sitting  Lower Body Bathing: Maximal assistance;Sit to/from stand   Upper Body Dressing : Moderate assistance;Sitting  Lower Body Dressing: Maximal assistance;Sit to/from stand  Toilet Transfer: +2 for physical assistance;Moderate assistance  Toileting- Clothing Manipulation and Hygiene: Moderate assistance;Sit to/from stand  Functional mobility during ADLs: +2 for physical assistance;Maximal assistance  General ADL Comments: Pt required cues for sequence for advancing gait and upright posture. pt with anterior weight shift and needing incr (A) with ambulation. pt cued for posture and able to self correct. pt unable to sustain personal corrections and needing cues again.pt ambulated ~10 ft. Pt leaning to the left and needing cues to lean to the right. Pt with ataxic RT UE movement. Pt required extensive (A) with electric razor due to Rt UE ataxic movement    Mobility  Overal bed mobility: Needs Assistance  Bed Mobility: Supine to Sit  Supine to sit: Mod assist  General bed mobility comments: in chair    Transfers  Overall transfer level: Needs assistance  Equipment used: 2 person hand held assist  Transfers: Sit to/from Stand  Sit to Stand: +2 physical assistance;Mod assist  Stand pivot transfers: Mod assist;+2 physical assistance  General transfer comment: pt provided hand held (A) to help advance ambulation    Ambulation / Gait / Stairs / Wheelchair Mobility  Ambulation/Gait  Ambulation/Gait assistance: Mod assist;+2 physical assistance  Ambulation Distance (Feet): 3 Feet  Assistive device: Rolling walker (2 wheeled)  Gait Pattern/deviations: Step-through pattern;Decreased stride length;Staggering right;Narrow base of support  Gait velocity: decreased  Gait velocity interpretation: Below normal speed for age/gender  General Gait Details: pt unsteady and leans hard to R side. pt with increased dizziness during ambulation.    Posture / Balance  Dynamic Sitting Balance  Sitting balance - Comments: pt able to correct posture to midline with verbal/visual  cues.    Special needs/care consideration  BiPAP/CPAP no  CPM no  Continuous Drip IV no  Dialysis no  Life Vest no  Oxygen no  Special Bed no  Trach Size no  Wound Vac (area) no  Skin - bump on L side of head from fall  Bowel mgmt: last BM on 09-25-13  Bladder mgmt: no incontinence  Diabetic mgmt no  Note: pt has been impulsive and demonstrates decreased insight per wife (pt did have a fall one night)    Previous Home Environment  Living Arrangements: Spouse/significant other  Lives With: Spouse  Available Help at Discharge: Family;Available 24 hours/day  Type of Home: House  Home Layout: Multi-level;Able to live on main level with bedroom/bathroom  Home Access: Stairs to enter  Entrance Stairs-Rails: Left  Entrance Stairs-Number of Steps: 4  Bathroom Shower/Tub: Tourist information centre manager: Standard  Bathroom Accessibility: Yes  How Accessible: Accessible via walker  [place]: No  Discharge Living Setting  Plans for Discharge Living Setting: Patient's home  Type of Home at Discharge: House  Discharge Home Layout: Two level;Able to live on main level with bedroom/bathroom (pt will stay on mail floor)  Discharge Home Access: Stairs to enter  Entrance  Stairs-Rails: Left  Entrance Stairs-Number of Steps: 4  Does the patient have any problems obtaining your medications?: No  Social/Family/Support Systems  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)  Sport and exercise psychologist Information: wife Manuela Schwartz is primary contact  Anticipated Caregiver: wife  Anticipated Caregiver's Contact Information: see above  Ability/Limitations of Caregiver: wife is a Forensic psychologist and has a flexible schedule  Caregiver Availability: 24/7 (wife states her real estate work is flexible)  Discharge Plan Discussed with Primary Caregiver: Yes  Is Caregiver In Agreement with Plan?: Yes  Does Caregiver/Family have Issues with Lodging/Transportation while Pt is in Rehab?: No  Goals/Additional  Needs  Patient/Family Goal for Rehab: Supervision with PT/OT and Modified Ind and supervision with SLP  Expected length of stay: 12-16 days  Cultural Considerations: none  Dietary Needs: regular diet  Equipment Needs: to be determined  Pt/Family Agrees to Admission and willing to participate: Yes (spoke with pt's wife)  Program Orientation Provided & Reviewed with Pt/Caregiver Including Roles & Responsibilities: Yes  Decrease burden of Care through IP rehab admission: NA  Possible need for SNF placement upon discharge: not anticipated  Patient Condition: This patient's condition remains as documented in the consult dated 09-29-13, in which the Rehabilitation Physician determined and documented that the patient's condition is appropriate for intensive rehabilitative care in an inpatient rehabilitation facility. Will admit to inpatient rehab today.  Preadmission Screen Completed By: Nanetta Batty, PT, 09/30/2013 4:00 PM  ______________________________________________________________________  Discussed status with Dr. Naaman Plummer on 09-30-13 at 59 and received telephone approval for admission today.  Admission Coordinator: Nanetta Batty, PT, time1600/Date8-25-15    Cosigned by: Meredith Staggers, MD [09/30/2013 4:36 PM]

## 2013-10-01 NOTE — Progress Notes (Signed)
INITIAL NUTRITION ASSESSMENT  Pt meets criteria for SEVERE MALNUTRITION in the context of acute illness or injury as evidenced by a 2.2% weight loss in one week, energy intake </= 50% for >/= 5 days, and moderate fat and muscle mass depletion.  DOCUMENTATION CODES Per approved criteria  -Severe malnutrition in the context of acute illness or injury   INTERVENTION: Continue Ensure Complete po BID, each supplement provides 350 kcal and 13 grams of protein  Provide Resource Breeze po TID, each supplement provides 250 kcal and 9 grams of protein  Encourage PO intake.  NUTRITION DIAGNOSIS: Inadequate oral intake related to decreased appetite as evidenced by meal completion of 10-25%.   Goal: Pt to meet >/= 90% of their estimated nutrition needs   Monitor:  PO intake, weight trends, labs, I/O's  Reason for Assessment: MST  78 y.o. male  Admitting Dx: Intracerebral hemorrhage   ASSESSMENT: Pt with history of CAD with cardiac arrest July 2015. CVA 2011 with little residual weakness. Independent prior to admission living with his wife. Presented 09/25/2013 with headache, nausea and vomiting as well as right-sided weakness. CT/MRI of the brain showed a hematoma in the inferior right cerebellum.   Pt was seen by a RD during his acute hospitalization stay.  Pt reports having a lack of appetite. Meal completion is 10-25%. Pt reports he has been having nausea and vomiting, which has started yesterday. Pt does report that he has been having nausea over the past week since his stroke. Pt reports prior to his stroke, he was eating fine at home with no difficulties. Pt reports he has been having weight loss due to his poor appetite. Pt's usual body weight is 185 lbs. Noted pt with a 2.2% weight loss in one week. Pt reports having received Ensure during stay, but is not drinking it as the consistency is "thick and heavy" on the pt's stomach, which may make the nausea worse. Pt is willing to try  [employer]. Will order. Pt was encouraged to eat his foods at meals and drink his oral supplements.   Nutrition Focused Physical Exam:  Subcutaneous Fat:  Orbital Region: N/A Upper Arm Region: Moderate depletion Thoracic and Lumbar Region: Moderate depletion  Muscle:  Temple Region: Moderate depletion Clavicle Bone Region: Moderate depletion Clavicle and Acromion Bone Region: Moderate depletion Scapular Bone Region: N/A Dorsal Hand: Moderate depletion Patellar Region: WNL Anterior Thigh Region: Moderate depletion Posterior Calf Region: WNL  Edema: none  Labs:Low sodium, chloride, total bilirubin, and GFR.  Height: Ht Readings from Last 1 Encounters:  09/30/13 5\' 11"  (1.803 m)    Weight: Wt Readings from Last 1 Encounters:  10/01/13 171 lb 8 oz (77.792 kg)    Ideal Body Weight: 172 lbs  % Ideal Body Weight: 99%  Wt Readings from Last 10 Encounters:  10/01/13 171 lb 8 oz (77.792 kg)  09/25/13 175 lb (79.379 kg)  09/19/13 174 lb (78.926 kg)  09/10/13 177 lb 7.5 oz (80.5 kg)  07/16/13 185 lb (83.915 kg)  05/13/13 191 lb (86.637 kg)  12/10/12 193 lb (87.544 kg)  05/27/12 201 lb (91.173 kg)  04/05/12 207 lb (93.895 kg)    Usual Body Weight: 185 lbs  % Usual Body Weight: 92%  BMI:  Body mass index is 23.93 kg/(m^2).  Estimated Nutritional Needs: Kcal: 2000-2200  Protein: 85-95 grams Fluid: 2- 2.2 L/day  Skin: Small abrasion above right eye  Diet Order: General  EDUCATION NEEDS: -No education needs identified at this time  Intake/Output Summary (Last 24 hours) at 10/01/13 0953 Last data filed at 10/01/13 0900  Gross per 24 hour  Intake    480 ml  Output      0 ml  Net    480 ml    Last BM: 8/22   Labs:   Recent Labs Lab 09/29/13 0235 09/30/13 0625 10/01/13 0740  NA 134* 130* 133*  K 4.1 4.0 4.1  CL 96 93* 94*  CO2 27 25 24   BUN 14 15 14   CREATININE 0.95 1.03 1.11  CALCIUM 9.3 9.1 9.1  GLUCOSE 95 102* 117*    CBG (last 3)   No results found for this basename: GLUCAP,  in the last 72 hours  Scheduled Meds: . aspirin EC  81 mg Oral Daily  . atorvastatin  80 mg Oral QPM  . baclofen  5 mg Oral TID  . feeding supplement (ENSURE COMPLETE)  237 mL Oral BID BM  . furosemide  40 mg Oral Daily  . heparin  5,000 Units Subcutaneous 3 times per day  . isosorbide mononitrate  60 mg Oral Daily  . levothyroxine  175 mcg Oral QAC breakfast  . losartan  50 mg Oral Daily  . metoprolol  100 mg Oral BID  . pantoprazole  40 mg Oral Daily  . senna-docusate  1 tablet Oral BID  . sulfamethoxazole-trimethoprim  400 mg Intravenous Q12H    Continuous Infusions:   Past Medical History  Diagnosis Date  . Lumbar stenosis     L5  . Hypothyroidism   . Hyperlipidemia   . Hypertension   . ED (erectile dysfunction)   . Internal carotid artery stenosis 5/15    bilateral, 40-59%  . Myocardial infarction 08/2013  . Heart murmur   . Stroke ~ 2011    "slight; no permanent damage"    Past Surgical History  Procedure Laterality Date  . Tonsillectomy and adenoidectomy    . Appendectomy    . Coronary angioplasty with stent placement  08/2013    "2"  . Cataract extraction, bilateral Bilateral ~ 2012    Kallie Locks, MS, Provisional LDN Pager # [phone] After hours/ weekend pager # [phone]

## 2013-10-01 NOTE — Progress Notes (Signed)
78 y.o. right-handed male with history of CAD with cardiac arrest July 2015 and maintained on Plavix. CVA 2011 with little residual weakness. Independent prior to admission living with his wife. Presented 09/25/2013 with headache, nausea and vomiting as well as right-sided weakness. CT/MRI of the brain showed a 2.6 x 1.9 cm hematoma in the inferior right cerebellum. Neurosurgery consulted advise conservative care. Noted fall 09/27/2013 while in the hospital as patient attempted to get out of bed unassisted. A followup cranial CT scan completed showing interval decrease in size of right cerebellar hemorrhage with slightly increased ventriculomegaly  involving the lateral and third ventricles. Maintained on a dysphagia 2 nectar thick liquid diet. Subcutaneous heparin initiated for DVT prophylaxis 09/28/2013  Subjective/Complaints: Dry mouth complaints, can you stop one of my meds?  Can I fly to [place] on Sept 10?  Discussed rec for no travel at this time  Objective: Vital Signs: Blood pressure 156/67, pulse 70, temperature 98.4 F (36.9 C), temperature source Oral, resp. rate 18, height 5' 11"  (1.803 m), weight 77.792 kg (171 lb 8 oz), SpO2 97.00%. No results found. Results for orders placed during the hospital encounter of 09/30/13 (from the past 72 hour(s))  CBC WITH DIFFERENTIAL     Status: Abnormal   Collection Time    10/01/13  7:40 AM      Result Value Ref Range   WBC 10.7 (*) 4.0 - 10.5 K/uL   RBC 3.79 (*) 4.22 - 5.81 MIL/uL   Hemoglobin 10.2 (*) 13.0 - 17.0 g/dL   HCT 31.5 (*) 39.0 - 52.0 %   MCV 83.1  78.0 - 100.0 fL   MCH 26.9  26.0 - 34.0 pg   MCHC 32.4  30.0 - 36.0 g/dL   RDW 16.9 (*) 11.5 - 15.5 %   Platelets 452 (*) 150 - 400 K/uL   Neutrophils Relative % 79 (*) 43 - 77 %   Neutro Abs 8.5 (*) 1.7 - 7.7 K/uL   Lymphocytes Relative 10 (*) 12 - 46 %   Lymphs Abs 1.1  0.7 - 4.0 K/uL   Monocytes Relative 9  3 - 12 %   Monocytes Absolute 1.0  0.1 - 1.0 K/uL   Eosinophils  Relative 2  0 - 5 %   Eosinophils Absolute 0.2  0.0 - 0.7 K/uL   Basophils Relative 0  0 - 1 %   Basophils Absolute 0.0  0.0 - 0.1 K/uL  COMPREHENSIVE METABOLIC PANEL     Status: Abnormal   Collection Time    10/01/13  7:40 AM      Result Value Ref Range   Sodium 133 (*) 137 - 147 mEq/L   Potassium 4.1  3.7 - 5.3 mEq/L   Chloride 94 (*) 96 - 112 mEq/L   CO2 24  19 - 32 mEq/L   Glucose, Bld 117 (*) 70 - 99 mg/dL   BUN 14  6 - 23 mg/dL   Creatinine, Ser 1.11  0.50 - 1.35 mg/dL   Calcium 9.1  8.4 - 10.5 mg/dL   Total Protein 7.6  6.0 - 8.3 g/dL   Albumin 2.5 (*) 3.5 - 5.2 g/dL   AST 11  0 - 37 U/L   ALT 10  0 - 53 U/L   Alkaline Phosphatase 71  39 - 117 U/L   Total Bilirubin 0.2 (*) 0.3 - 1.2 mg/dL   GFR calc non Af Amer 63 (*) >90 mL/min   GFR calc Af Amer 73 (*) >90 mL/min  Comment: (NOTE)     The eGFR has been calculated using the CKD EPI equation.     This calculation has not been validated in all clinical situations.     eGFR's persistently <90 mL/min signify possible Chronic Kidney     Disease.   Anion gap 15  5 - 15     HEENT: normal and nystagmus Cardio: RRR and no murmur Resp: CTA B/L GI: BS positive and NT, ND Extremity:  Pulses positive and No Edema Skin:   Intact Neuro: Alert/Oriented, Cranial Nerve Abnormalities nystagmus horz, Normal Sensory, Abnormal Motor 4/5 in BUE and BLE and Abnormal FMC Ataxic/ dec FMC Musc/Skel:  Normal Gen NAD Dysmetria and pass pointing with R FNF  Assessment/Plan: 1. Functional deficits secondary to RIght cerebellar ICH which require 3+ hours per day of interdisciplinary therapy in a comprehensive inpatient rehab setting. Physiatrist is providing close team supervision and 24 hour management of active medical problems listed below. Physiatrist and rehab team continue to assess barriers to discharge/monitor patient progress toward functional and medical goals. FIM:                   Comprehension Comprehension Mode:  Auditory Comprehension: 4-Understands basic 75 - 89% of the time/requires cueing 10 - 24% of the time  Expression Expression Mode: Verbal Expression: 4-Expresses basic 75 - 89% of the time/requires cueing 10 - 24% of the time. Needs helper to occlude trach/needs to repeat words.  Social Interaction Social Interaction: 4-Interacts appropriately 75 - 89% of the time - Needs redirection for appropriate language or to initiate interaction.  Problem Solving Problem Solving: 4-Solves basic 75 - 89% of the time/requires cueing 10 - 24% of the time  Memory Memory: 4-Recognizes or recalls 75 - 89% of the time/requires cueing 10 - 24% of the time  Medical Problem List and Plan:   1. Functional deficits secondary to right cerebellar ICH.   2. DVT Prophylaxis/Anticoagulation: Subcutaneous heparin initiated 09/28/2013. Monitor for any bleeding episodes   3. Pain Management: Baclofen 5 mg 3 times a day, Hydrocodone as needed. Monitor with increased mobility   4. Dysphagia. Dysphagia 2 nectar liquids. Monitor for any signs of aspiration. Followup speech therapy   5. Neuropsych: This patient is capable of making decisions on his own behalf.   6. Skin/Wound Care: Routine skin checks   7. Hypertension. Lasix 40 mg daily, Imdur 60 mg daily, Cozaar 50 mg daily, Lopressor 100 mg twice a day.   8. History of coronary artery disease with cardiac arrest July 2015. No chest pain or shortness of breath. Patient on Plavix prior to admission currently on hold secondary to [place]. Remains on aspirin 81 mg daily. Followup cardiology Dr. Meda Coffee as needed   9. Hypothyroidism. Synthroid. Latest TSH level 4.173   10. UTI. Urine study 09/28/2013 positive nitrite many bacteria continue Bactrim for now    LOS (Days) 1 A [place] E 10/01/2013, 8:50 AM

## 2013-10-01 NOTE — Discharge Instructions (Signed)
Inpatient Rehab Discharge Instructions  Taylor Keller Discharge date and time: No discharge date for patient encounter.   Activities/Precautions/ Functional Status: Activity: activity as tolerated Diet: regular diet Wound Care: none needed Functional status:  ___ No restrictions     ___ Walk up steps independently _x__ 24/7 supervision/assistance   ___ Walk up steps with assistance ___ Intermittent supervision/assistance  ___ Bathe/dress independently ___ Walk with walker     ___ Bathe/dress with assistance ___ Walk Independently    ___ Shower independently _x__ Walk with assistance    ___ Shower with assistance ___ No alcohol     ___ Return to work/school ________  Special Instructions:    My questions have been answered and I understand these instructions. I will adhere to these goals and the provided educational materials after my discharge from the hospital.  Patient/Caregiver Signature _______________________________ Date __________  Clinician Signature _______________________________________ Date __________  Please bring this form and your medication list with you to all your follow-up doctor's appointments.

## 2013-10-01 NOTE — Evaluation (Signed)
Speech Language Pathology Assessment and Plan  Patient Details  Name: Taylor Keller MRN: [id number] Date of Birth: March 25, 1935  SLP Diagnosis: Cognitive Impairments;Dysphagia;Dysarthria  Rehab Potential: Excellent ELOS: 3 weeks     Today's Date: 10/01/2013 SLP Individual Time: 1100-1200 SLP Individual Time Calculation (min): 60 min   Problem List:  Patient Active Problem List   Diagnosis Date Noted  . ICH (intracerebral hemorrhage) 09/25/2013  . Chest pain at rest 09/08/2013  . H/O cardiac arrest July 2015 09/08/2013  . CAD- s/p LAD and CFX stent July 2015 09/08/2013  . Acute CHF 09/08/2013  . Internal carotid artery stenosis   . Hypothyroidism   . Hyperlipidemia   . Hypertension   . ED (erectile dysfunction)    Past Medical History:  Past Medical History  Diagnosis Date  . Lumbar stenosis     L5  . Hypothyroidism   . Hyperlipidemia   . Hypertension   . ED (erectile dysfunction)   . Internal carotid artery stenosis 5/15    bilateral, 40-59%  . Myocardial infarction 08/2013  . Heart murmur   . Stroke ~ 2011    "slight; no permanent damage"   Past Surgical History:  Past Surgical History  Procedure Laterality Date  . Tonsillectomy and adenoidectomy    . Appendectomy    . Coronary angioplasty with stent placement  08/2013    "2"  . Cataract extraction, bilateral Bilateral ~ 2012    Assessment / Plan / Recommendation Clinical Impression Patient is a 78 y.o. right-handed male with history of CAD with cardiac arrest July 2015 and maintained on Plavix. CVA 2011 with little residual weakness. Independent prior to admission living with his wife. Presented 09/25/2013 with headache, nausea and vomiting as well as right-sided weakness. CT/MRI of the brain showed a 2.6 x 1.9 cm hematoma in the inferior right cerebellum. Neurosurgery consulted advise conservative care. Noted fall 09/27/2013 while in the hospital as patient attempted to get out of bed unassisted. A follow-up  cranial CT scan completed showing interval decrease in size of right cerebellar hemorrhage with slightly increased ventriculomegaly involving the lateral and third ventricles. Physical therapy evaluation completed with recommendations of physical medicine rehabilitation consult and patient was transferred to CIR on 09/30/2013. Pt was administered a BSE and demonstrated prolonged oral transit with all consistencies with a delayed throat clear with solid textures and thin liquids.  Suspect spontaneous throat clear expelled penetrates. Patient's wife reports patient had a consistent throat clear at baseline. Recommend patient downgrade to Dys. 3 textures and continue thin liquids to increase safety and energy conservation with PO intake in hopes of increasing his overall PO intake. Patient was also administered a cognitive-linguistic evaluation and demonstrated decreased attention, decreased awareness, decreased problem solving, decreased memory and decreased safety awareness which impacts his overall safety with basic and familiar tasks. Patient also demonstrates a decreased vocal intensity which impacts his overall speech intelligibility to ~90% at the conversation level. Patient will benefit from skilled SLP intervention to increase his swallowing and cognitive-linguistic function in order to maximize his overall functional independence. Anticipate patient will require 24 hour supervision and follow up home health vs follow up outpatient therapy. Patient's wife present throughout the session and verbalized/demonstrated understanding of all information presented, therefore, she is able to provide full supervision during meals.   Skilled Therapeutic Interventions          Patient was administered a BSE and cognitive-linguistic evaluation. Please see above for details.   SLP Assessment  Patient will  need skilled Speech Lanaguage Pathology Services during CIR admission    Recommendations  Diet Recommendations:  Dysphagia 3 (Mechanical Soft);Thin liquid Liquid Administration via: Cup;No straw Medication Administration: Whole meds with puree Supervision: Patient able to self feed;Full supervision/cueing for compensatory strategies Compensations: Slow rate;Small sips/bites;Clear throat intermittently Postural Changes and/or Swallow Maneuvers: Seated upright 90 degrees Oral Care Recommendations: Oral care BID Recommendations for Other Services: Neuropsych consult Patient destination: Home Follow up Recommendations: Home Health SLP;Outpatient SLP;24 hour supervision/assistance Equipment Recommended: None recommended by SLP    SLP Frequency 5 out of 7 days   SLP Treatment/Interventions Cognitive remediation/compensation;Cueing hierarchy;Dysphagia/aspiration precaution training;Functional tasks;Environmental controls;Internal/external aids;Speech/Language facilitation;Patient/family education;Therapeutic Activities    Pain Pain Assessment Pain Assessment: No/denies pain Prior Functioning Type of Home: House  Lives With: Spouse Available Help at Discharge: Family;Available 24 hours/day Vocation: Retired  Industrial/product designer Term Goals: Week 1: SLP Short Term Goal 1 (Week 1): Pt will utilize an increased vocal intensity at the sentence level with supervision verbal cues.  SLP Short Term Goal 2 (Week 1): Pt will utilize swallowing compensatory strategies with current diet to minimize overt s/s of aspiration with Min A multimodal cues.  SLP Short Term Goal 3 (Week 1): Pt will demonstrate selective attention in a mildly distracting enviornment with supervision verbal cues for 60 minutes.  SLP Short Term Goal 4 (Week 1): Pt will demonstrate functional problem solving for basic and familiar tasks with Min A multimodal cues.  SLP Short Term Goal 5 (Week 1): Pt will recall new, daily information with use of external aids with supervision multimodal cues.   See FIM for current functional status Refer to Care Plan for Long  Term Goals  Recommendations for other services: Neuropsych  Discharge Criteria: Patient will be discharged from SLP if patient refuses treatment 3 consecutive times without medical reason, if treatment goals not met, if there is a change in medical status, if patient makes no progress towards goals or if patient is discharged from hospital.  The above assessment, treatment plan, treatment alternatives and goals were discussed and mutually agreed upon: by patient and by family  Tameko Halder 10/01/2013, 4:24 PM

## 2013-10-01 NOTE — Progress Notes (Signed)
Taylor Staggers, MD Physician Signed Physical Medicine and Rehabilitation Consult Note Service date: 09/29/2013 7:06 AM  Related encounter: Admission (Discharged) from 09/25/2013 in [facility]           Physical Medicine and Rehabilitation Consult Reason for Consult: Cerebellar ICH Referring Physician: Dr.Xu     HPI: Taylor Keller is a 78 y.o. right-handed male with history of CAD with cardiac arrest July 2015 and maintained on Plavix. CVA 2011 with little residual weakness. Independent prior to admission living with his wife. Presented 09/25/2013 with headache, nausea and vomiting as well as right-sided weakness. CT/MRI of the brain showed a 2.6 x 1.9 cm hematoma in the inferior right cerebellum. Neurosurgery consulted advise conservative care. Noted fall 09/27/2013 while in the hospital as patient attempted to get out of bed unassisted. A followup cranial CT scan completed showing interval decrease in size of right cerebellar hemorrhage with slightly increased ventriculomegaly  involving the lateral and third ventricles. Maintained on a dysphagia 2 nectar thick liquid diet. Subcutaneous heparin initiated for DVT prophylaxis 09/28/2013. Physical therapy evaluation completed a 21 2015 with recommendations of physical medicine rehabilitation consult.     Review of Systems  Cardiovascular: Positive for chest pain and palpitations.  Musculoskeletal: Positive for back pain.  Psychiatric/Behavioral: The patient has insomnia.   All other systems reviewed and are negative. Past Medical History   Diagnosis  Date   .  Lumbar stenosis         L5   .  Hypothyroidism     .  Hyperlipidemia     .  Hypertension     .  ED (erectile dysfunction)     .  Internal carotid artery stenosis  5/15       bilateral, 40-59%   .  Myocardial infarction  08/2013   .  Heart murmur     .  Stroke  ~ 2011       "slight; no permanent damage"    Past Surgical History    Procedure  Laterality  Date   .  Tonsillectomy and adenoidectomy       .  Appendectomy       .  Coronary angioplasty with stent placement    08/2013       "2"   .  Cataract extraction, bilateral  Bilateral  ~ 2012    Family History   Problem  Relation  Age of Onset   .  Colon cancer  Sister         colon    Social History: reports that he has never smoked. He has never used smokeless tobacco. He reports that he drinks alcohol. He reports that he does not use illicit drugs. Allergies:   Allergies   Allergen  Reactions   .  Benazepril  Cough    Medications Prior to Admission   Medication  Sig  Dispense  Refill   .  acetaminophen (TYLENOL) 325 MG tablet  Take 2 tablets (650 mg total) by mouth every 4 (four) hours as needed for headache or mild pain.         Marland Kitchen  albuterol (PROVENTIL) (5 MG/ML) 0.5% nebulizer solution  Take 2.5 mg by nebulization every 6 (six) hours as needed for wheezing or shortness of breath.         Marland Kitchen  aspirin 81 MG chewable tablet  Chew 81 mg by mouth daily.         Marland Kitchen  atorvastatin (  LIPITOR) 80 MG tablet  Take 80 mg by mouth every evening.         .  clopidogrel (PLAVIX) 75 MG tablet  Take 75 mg by mouth daily.         .  diphenhydrAMINE (BENADRYL) 25 MG tablet  Take 25 mg by mouth every 6 (six) hours as needed.         .  furosemide (LASIX) 40 MG tablet  Take 1 tablet (40 mg total) by mouth daily.   30 tablet   11   .  ipratropium (ATROVENT) 0.02 % nebulizer solution  Take 0.5 mg by nebulization every 6 (six) hours as needed for wheezing or shortness of breath.         .  isosorbide mononitrate (IMDUR) 60 MG 24 hr tablet  Take 60 mg by mouth daily.         .  lansoprazole (PREVACID SOLUTAB) 30 MG disintegrating tablet  Take 30 mg by mouth daily.         Marland Kitchen  levothyroxine (SYNTHROID, LEVOTHROID) 175 MCG tablet  Take 175 mcg by mouth daily.         Marland Kitchen  losartan (COZAAR) 25 MG tablet  Take 1 tablet (25 mg total) by mouth daily.   30 tablet   11   .  metoprolol  (LOPRESSOR) 50 MG tablet  Take 50 mg by mouth 2 (two) times daily.         .  nitroGLYCERIN (NITROSTAT) 0.4 MG SL tablet  Place 1 tablet (0.4 mg total) under the tongue every 5 (five) minutes as needed for chest pain.   25 tablet   prn   .  zolpidem (AMBIEN) 5 MG tablet  Take 1 tablet (5 mg total) by mouth at bedtime as needed for sleep.   30 tablet   1      Home: Home Living Family/patient expects to be discharged to:: Private residence Living Arrangements: Spouse/significant other Available Help at Discharge: Family;Available 24 hours/day Type of Home: House Home Access: Stairs to enter [employer] of Steps: 4 Entrance Stairs-Rails: Left Home Layout: Multi-level;Able to live on main level with bedroom/bathroom Home Equipment: Gilford Rile - 2 wheels;Bedside commode  Lives With: Spouse   Functional History: Prior Function Level of Independence: Independent Comments: retired Tax adviser Status:   Mobility: Bed Mobility Overal bed mobility: Needs Assistance Bed Mobility: Supine to Sit Supine to sit: Mod assist General bed mobility comments: Increased time to perform, assist for stability and positioning, assist for elevation to upright and hip rotation to EOB Transfers Overall transfer level: Needs assistance Equipment used: 2 person hand held assist Transfers: Sit to/from Stand Sit to Stand: Mod assist;+2 physical assistance;+2 safety/equipment Stand pivot transfers: Mod assist;+2 physical assistance General transfer comment: ataxic gait pattern/trunkal ataxia Ambulation/Gait Ambulation/Gait assistance: Mod assist;+2 physical assistance Ambulation Distance (Feet): 8 Feet Gait Pattern/deviations: Step-through pattern;Steppage;Ataxic;Staggering left;Staggering right;Narrow base of support Gait velocity: decreased Gait velocity interpretation: Below normal speed for age/gender General Gait Details: Patient very unsteady, steps very effortful and uncoordinated.  Inability to control body positioning during upright requires significant assist with posterior bias.   ADL: ADL Overall ADL's : Needs assistance/impaired Eating/Feeding: Minimal assistance;Sitting Eating/Feeding Details (indicate cue type and reason): dificulty controlling utensil and judging depth to self feed Grooming: Moderate assistance Grooming Details (indicate cue type and reason): movement causing nausea Upper Body Bathing: Moderate assistance;Sitting Lower Body Bathing: Maximal assistance;Sit to/from stand Upper Body Dressing : Moderate assistance;Sitting Lower Body Dressing: Maximal  assistance;Sit to/from stand Toilet Transfer: Moderate assistance Toileting- Clothing Manipulation and Hygiene: Moderate assistance;Sit to/from stand Functional mobility during ADLs: Moderate assistance General ADL Comments: ADL significantly limited by c/o dizziness and nausea and ataxia   Cognition: Cognition Overall Cognitive Status: Impaired/Different from baseline Arousal/Alertness: Lethargic Orientation Level: Oriented X4 Attention: Selective Selective Attention: Impaired Selective Attention Impairment: Verbal complex Memory: Impaired Memory Impairment: Decreased short term memory;Decreased recall of new information (much improved with verbal cues and repetition) Decreased Short Term Memory: Verbal complex Awareness: Impaired Awareness Impairment: Intellectual impairment;Emergent impairment (pt developing intellectual and emergent awareness) Problem Solving:  (emerging problem solving skills regarding new impairment) Executive Function: Reasoning;Initiating;Self Monitoring;Self Correcting Reasoning: Appears intact Initiating: Appears intact Self Monitoring: Appears intact Self Correcting: Appears intact Safety/Judgment: Appears intact Cognition Arousal/Alertness: Awake/alert Behavior During Therapy: WFL for tasks assessed/performed Overall Cognitive Status: Impaired/Different  from baseline Area of Impairment: Safety/judgement Safety/Judgement: Decreased awareness of safety   Blood pressure 167/71, pulse 72, temperature 98.1 F (36.7 C), temperature source Oral, resp. rate 22, height 6' (1.829 m), weight 79.379 kg (175 lb), SpO2 98.00%. Physical Exam  Constitutional: He appears well-developed.  HENT:   Head: Normocephalic.  Eyes: EOM are normal.  Neck: Normal range of motion. Neck supple. No thyromegaly present.  Cardiovascular: Normal rate and regular rhythm.   Respiratory: Effort normal and breath sounds normal. No respiratory distress.  GI: Soft. Bowel sounds are normal. He exhibits no distension. There is no tenderness. There is no rebound.  Neurological: He is alert.  Patient makes good eye contact with examiner. He was able to provide his name and age. He had some decrease in safety awareness. Follows simple commands. Nystagmus 7-8 beats with right gaze. Leans to right with sitting EOB. Uses arms to support. Slightly weak on right side but difficult to assess eob. At least has 3-4/5 strength on the right side. Speech slightly slurred but intellgible. Fair insight and awareness.   Skin: Skin is warm and dry.  Psychiatric:  A little withdrawn (doesn't feel good)     Results for orders placed during the hospital encounter of 09/25/13 (from the past 24 hour(s))   PLATELET INHIBITION P2Y12     Status: None     Collection Time      09/28/13 11:45 AM       Result  Value  Ref Range     Platelet Function  P2Y12  281   194 - 418 PRU   URINALYSIS W MICROSCOPIC     Status: Abnormal     Collection Time      09/28/13  4:11 PM       Result  Value  Ref Range     Color, Urine  YELLOW   YELLOW     APPearance  CLOUDY (*)  CLEAR     Specific Gravity, Urine  1.015   1.005 - 1.030     pH  6.5   5.0 - 8.0     Glucose, UA  NEGATIVE   NEGATIVE mg/dL     Hgb urine dipstick  NEGATIVE   NEGATIVE     Bilirubin Urine  NEGATIVE   NEGATIVE     Ketones, ur  NEGATIVE   NEGATIVE  mg/dL     Protein, ur  NEGATIVE   NEGATIVE mg/dL     Urobilinogen, UA  0.2   0.0 - 1.0 mg/dL     Nitrite  POSITIVE (*)  NEGATIVE     Leukocytes, UA  NEGATIVE   NEGATIVE  WBC, UA  0-2   <3 WBC/hpf     Bacteria, UA  MANY (*)  RARE   CBC     Status: Abnormal     Collection Time      09/29/13  2:35 AM       Result  Value  Ref Range     WBC  8.7   4.0 - 10.5 K/uL     RBC  3.65 (*)  4.22 - 5.81 MIL/uL     Hemoglobin  9.6 (*)  13.0 - 17.0 g/dL     HCT  31.0 (*)  39.0 - 52.0 %     MCV  84.9   78.0 - 100.0 fL     MCH  26.3   26.0 - 34.0 pg     MCHC  31.0   30.0 - 36.0 g/dL     RDW  17.1 (*)  11.5 - 15.5 %     Platelets  389   150 - 400 K/uL   BASIC METABOLIC PANEL     Status: Abnormal     Collection Time      09/29/13  2:35 AM       Result  Value  Ref Range     Sodium  134 (*)  137 - 147 mEq/L     Potassium  4.1   3.7 - 5.3 mEq/L     Chloride  96   96 - 112 mEq/L     CO2  27   19 - 32 mEq/L     Glucose, Bld  95   70 - 99 mg/dL     BUN  14   6 - 23 mg/dL     Creatinine, Ser  0.95   0.50 - 1.35 mg/dL     Calcium  9.3   8.4 - 10.5 mg/dL     GFR calc non Af Amer  79 (*)  >90 mL/min     GFR calc Af Amer  >90   >90 mL/min     Anion gap  11   5 - 15    Ct Head Wo Contrast   09/28/2013   CLINICAL DATA:  Intracranial hemorrhage  EXAM: CT HEAD WITHOUT CONTRAST  TECHNIQUE: Contiguous axial images were obtained from the base of the skull through the vertex without intravenous contrast.  COMPARISON:  Prior CT from 09/26/2013  FINDINGS: Right cerebellar hemorrhage is decreased in size now measuring 3.7 x 2.8 cm, previously 4.1 x 2.6 cm. Hemorrhage again seen extending into the fourth ventricle. Intraventricular hemorrhage layering within the posterior horns of the lateral ventricles is decreased from prior. Hemorrhage present at the cerebral aqueduct. Ventriculomegaly involving the lateral and third ventricles is slightly increased. There is increased hypodensity around the frontal horns of the  lateral ventricles, suggesting transependymal flow of CSF. This is slightly more prominent than prior. The fourth ventricle remains slightly compress from a edema related to the adjacent parenchymal hemorrhage. Edema around the hemorrhage is stable to slightly increased.  Atrophy again noted. No new hemorrhage or intracranial infarct. No extra-axial fluid collection. No midline shift. Patchy supratentorial white matter disease again noted.  Scalp soft tissues and calvarium are unchanged. No acute abnormality seen about the orbits. Paranasal sinuses and mastoid air cells remain clear.  IMPRESSION: 1. Interval decrease in size of right cerebellar hemorrhage, now measuring 3.7 x 2.8 cm, previously 4.1 x 2.6 cm. 2. Interval decrease an overall volume of intraventricular hemorrhage with slightly increased ventriculomegaly involving the lateral  and third ventricles. Transependymal flow of CSF is slightly more prominent as compared to prior examination, compatible with worsening hydrocephalus.   Electronically Signed   By: Jeannine Boga M.D.   On: 09/28/2013 05:16     Assessment/Plan: Diagnosis: right cerebellar ICH Does the need for close, 24 hr/day medical supervision in concert with the patient's rehab needs make it unreasonable for this patient to be served in a less intensive setting? Yes Co-Morbidities requiring supervision/potential complications: htn, CAD, hx of chf Due to bladder management, bowel management, safety, skin/wound care, disease management, medication administration, pain management and patient education, does the patient require 24 hr/day rehab nursing? Yes Does the patient require coordinated care of a physician, rehab nurse, PT (1-2 hrs/day, 5 days/week), OT (1-2 hrs/day, 5 days/week) and SLP (1-2 hrs/day, 5 days/week) to address physical and functional deficits in the context of the above medical diagnosis(es)? Yes Addressing deficits in the following areas: balance, endurance,  locomotion, strength, transferring, bowel/bladder control, bathing, dressing, feeding, grooming, toileting, cognition, speech, language, swallowing and psychosocial support Can the patient actively participate in an intensive therapy program of at least 3 hrs of therapy per day at least 5 days per week? Yes The potential for patient to make measurable gains while on inpatient rehab is excellent Anticipated functional outcomes upon discharge from inpatient rehab are supervision with PT, supervision with OT, modified independent and supervision with SLP. Estimated rehab length of stay to reach the above functional goals is: 12-16 days Does the patient have adequate social supports to accommodate these discharge functional goals? Yes Anticipated D/C setting: Home Anticipated post D/C treatments: HH therapy, Outpatient therapy and Home excercise program Overall Rehab/Functional Prognosis: excellent   RECOMMENDATIONS: This patient's condition is appropriate for continued rehabilitative care in the following setting: CIR Patient has agreed to participate in recommended program. Yes Note that insurance prior authorization may be required for reimbursement for recommended care.   Comment: Rehab Admissions Coordinator to follow up.   Thanks,   Taylor Staggers, MD, Mellody Drown         09/29/2013    Revision History...     Date/Time User Action   09/29/2013 3:40 PM Taylor Staggers, MD Sign   09/29/2013 7:40 AM Cathlyn Parsons, PA-C Pend  View Details Report   Routing History...     Date/Time From To Method   09/29/2013 3:40 PM Taylor Staggers, MD Taylor Staggers, MD In Basket   09/29/2013 3:40 PM Taylor Staggers, MD Susy Frizzle, MD In [facility]

## 2013-10-01 NOTE — Evaluation (Signed)
Physical Therapy Assessment and Plan  Patient Details  Name: Taylor Keller MRN: [id number] Date of Birth: 1935-12-26  PT Diagnosis: Abnormality of gait, Ataxia, Cognitive deficits, Difficulty walking, Dizziness and giddiness, Hemiparesis dominant and Impaired cognition Rehab Potential: Excellent ELOS: 21-23 days    Today's Date: 10/01/2013 PT Individual Time: 1443-1540 PT Individual Time Calculation (min): 65 min    Problem List:  Patient Active Problem List   Diagnosis Date Noted  . ICH (intracerebral hemorrhage) 09/25/2013  . Chest pain at rest 09/08/2013  . H/O cardiac arrest July 2015 09/08/2013  . CAD- s/p LAD and CFX stent July 2015 09/08/2013  . Acute CHF 09/08/2013  . Internal carotid artery stenosis   . Hypothyroidism   . Hyperlipidemia   . Hypertension   . ED (erectile dysfunction)     Past Medical History:  Past Medical History  Diagnosis Date  . Lumbar stenosis     L5  . Hypothyroidism   . Hyperlipidemia   . Hypertension   . ED (erectile dysfunction)   . Internal carotid artery stenosis 5/15    bilateral, 40-59%  . Myocardial infarction 08/2013  . Heart murmur   . Stroke ~ 2011    "slight; no permanent damage"   Past Surgical History:  Past Surgical History  Procedure Laterality Date  . Tonsillectomy and adenoidectomy    . Appendectomy    . Coronary angioplasty with stent placement  08/2013    "2"  . Cataract extraction, bilateral Bilateral ~ 2012    Assessment & Plan Clinical Impression: Patient is a 78 y.o. year old male with history of CAD with cardiac arrest July 2015 and maintained on Plavix. CVA 2011 with little residual weakness. Independent prior to admission living with his wife. Presented 09/25/2013 with headache, nausea and vomiting as well as right-sided weakness. CT/MRI of the brain showed a 2.6 x 1.9 cm hematoma in the inferior right cerebellum. Neurosurgery consulted advise conservative care. Noted fall 09/27/2013 while in the  hospital as patient attempted to get out of bed unassisted. A followup cranial CT scan completed showing interval decrease in size of right cerebellar hemorrhage with slightly increased ventriculomegaly involving the lateral and third ventricles. Maintained on a dysphagia 2 nectar thick liquid diet. Subcutaneous heparin initiated for DVT prophylaxis 09/28/2013. Physical therapy evaluation completed a 21 2015 with recommendations of physical medicine rehabilitation consult.  Patient transferred to CIR on 09/30/2013 .   Patient currently requires max with mobility secondary to muscle weakness, impaired timing and sequencing, decreased coordination and decreased motor planning, decreased visual perceptual skills, decreased attention, decreased awareness, decreased problem solving and decreased memory and central origin.  Prior to hospitalization, patient was independent  with mobility and lived with Spouse in a House home.  Home access is 4Stairs to enter.  Patient will benefit from skilled PT intervention to maximize safe functional mobility, minimize fall risk and decrease caregiver burden for planned discharge home with 24 hour supervision.  Anticipate patient will benefit from follow up [facility] or OP at discharge.  PT - End of Session Activity Tolerance: Tolerates 30+ min activity with multiple rests Endurance Deficit: Yes Endurance Deficit Description: Pt notably fatigued at end of session.  PT Assessment Rehab Potential: Excellent PT Patient demonstrates impairments in the following area(s): Balance;Endurance;Motor;Safety PT Transfers Functional Problem(s): Bed Mobility;Bed to Chair;Car;Furniture;Floor PT Locomotion Functional Problem(s): Ambulation;Wheelchair Mobility;Stairs PT Plan PT Intensity: Minimum of 1-2 x/day ,45 to 90 minutes PT Frequency: 5 out of 7 days PT Duration Estimated Length of  Stay: 21-23 days  PT Treatment/Interventions: Ambulation/gait training;Balance/vestibular  training;Cognitive remediation/compensation;Discharge planning;Disease management/prevention;DME/adaptive equipment instruction;Functional electrical stimulation;Functional mobility training;Neuromuscular re-education;Patient/family education;Stair training;Therapeutic Activities;Therapeutic Exercise;UE/LE Strength taining/ROM;UE/LE Coordination activities;Visual/perceptual remediation/compensation;Wheelchair propulsion/positioning PT Transfers Anticipated Outcome(s): Supervision PT Locomotion Anticipated Outcome(s): Supervision PT Recommendation Follow Up Recommendations: Home health PT;24 hour supervision/assistance Patient destination: Home Equipment Recommended: To be determined  Skilled Therapeutic Intervention PT evaluation and assessment completed, see full details below.  Also performed quick vestibular evaluation, see below.  Eye Alignment WFL  Spontaneous  Nystagmus None noted  Gaze holding nystagmus Nystagmus with R gaze, horizontally, superiorly and inferiorly  Smooth pursuit Pt with good ROM, however noted "frog jumping" in R eye with movements in R field.   Oculomotor Good ROM, but with nystagmus in R field  Saccades Pt with undershooting noted during saccades testing  VOR slow Decreased ability to keep eyes on target  Head Thrust Test Not tested  Head Shaking Nystagmus Not tested  Rt. Hallpike Dix n/a  Lt. Hallpike Dix n/a  Rt. Roll Test n/a  Lt. Roll Test  n/a  Motion sensitivity Pt with increased c/o dizziness and "wooziness" with all movements.       Recommendations of vestibular nature:  Feel he would benefit from further testing with head thrust and with vision occluded/diminished to determine if L eye also involved.  Would benefit from VOR/gaze stabilization exercises with compensatory strategies used to increase mobility initially.    Discussed LOS, expected outcomes, possible equipment needs, etc with pt and family.  Both verbalized understanding.  Pt left in  recliner with wife and sitter in room.   PT Evaluation Precautions/Restrictions Precautions Precautions: Fall Precaution Comments: dizziness w/ all movement, nystagmus w/ R gaze, uncoordinated movements Restrictions Weight Bearing Restrictions: No General Chart Reviewed: Yes Family/Caregiver Present: Yes (wife Manuela Schwartz) Vital SignsTherapy Vitals Temp: 98.4 F (36.9 C) Temp src: Oral Pulse Rate: 70 Resp: 18 BP: 156/67 mmHg Oxygen Therapy SpO2: 97 % O2 Device: None (Room air) Pain Pain Assessment Pain Assessment: No/denies pain Pain Score: 0-No pain Home Living/Prior Functioning Home Living Available Help at Discharge: Family;Available 24 hours/day Type of Home: House Home Access: Stairs to enter [employer] of Steps: 4 Entrance Stairs-Rails: Left Home Layout: One level  Lives With: Spouse Prior Function Level of Independence: Independent with basic ADLs;Independent with gait;Independent with transfers  Able to Take Stairs?: Yes Driving: No (had just started driving again a few days prior to stroke) Vocation: Retired Leisure: Hobbies-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 Vision/Perception  [place] Alignment: Within Functional Limits Ocular Range of Motion: Within Functional Limits Alignment/Gaze Preference: Head turned (slightly to the R) Tracking/Visual Pursuits: Decreased smoothness of horizontal tracking;Decreased smoothness of vertical tracking Saccades: Decreased speed of saccadic movement;Additional eye shifts occurred during testing  Cognition Overall Cognitive Status: Impaired/Different from baseline Arousal/Alertness: Lethargic Orientation Level: Oriented X4 Attention: Selective Selective Attention: Impaired Selective Attention Impairment: Functional basic Memory: Impaired Memory Impairment: Decreased recall of new information;Decreased short term memory Decreased Short Term Memory: Functional basic Awareness:  Impaired Awareness Impairment: Emergent impairment Safety/Judgment: Appears intact Comments: Pt did not demonstrate any impulsivity during session and wife and sitter in room, therefore did not don quick release belt.  Sensation Sensation Light Touch: Appears Intact Proprioception: Appears Intact Additional Comments: Had increased difficulty testing proprioception due to pts poor sitting balance and difficulty following commands.  Coordination Gross Motor Movements are Fluid and Coordinated: No Fine Motor Movements are Fluid and Coordinated: No Coordination and Movement Description: Pt with some difficulty placing  RLE during gait and stairs, did note decreased coordination in RUE Heel Shin Test: decreased smoothness of movement Motor  Motor Motor: Hemiplegia;Abnormal postural alignment and control;Other (comment) Motor - Skilled Clinical Observations: Pt presents with decreased postural control in sitting/standing, decreased balance, decreased coordination in RUE/LE (more uncoordination rather than strength deficits).   Mobility Bed Mobility Bed Mobility: Supine to Sit Supine to Sit: 4: Min assist;HOB flat Supine to Sit Details: Verbal cues for sequencing;Verbal cues for technique;Verbal cues for precautions/safety;Manual facilitation for weight shifting Supine to Sit Details (indicate cue type and reason): Pt requires assist for weight shifting trunk over to L hip when getting to EOB.  Transfers Transfers: Yes Sit to Stand: 3: Mod assist Sit to Stand Details: Verbal cues for sequencing;Verbal cues for technique;Verbal cues for precautions/safety;Manual facilitation for weight shifting Stand to Sit: 3: Mod assist Stand to Sit Details (indicate cue type and reason): Verbal cues for sequencing;Verbal cues for technique;Verbal cues for precautions/safety;Manual facilitation for weight shifting Stand Pivot Transfers: 3: Mod assist;2: Max assist Stand Pivot Transfer Details: Verbal cues for  sequencing;Verbal cues for technique;Verbal cues for precautions/safety;Manual facilitation for weight shifting;Manual facilitation for weight bearing Locomotion  Ambulation Ambulation: Yes Ambulation/Gait Assistance: 3: Mod assist;1: +2 Total assist (+2 for chair follow) Ambulation Distance (Feet): 20 Feet Assistive device:  (L handrail in hallway) Ambulation/Gait Assistance Details: Verbal cues for sequencing;Verbal cues for technique;Verbal cues for precautions/safety;Manual facilitation for weight shifting;Manual facilitation for weight bearing Gait Gait: Yes Gait Pattern: Impaired Gait Pattern: Lateral trunk lean to left;Trunk rotated posteriorly on left;Trunk flexed;Narrow base of support;Ataxic (increased step length on RLE, only slightly ataxic) Stairs / Additional Locomotion Stairs: Yes Stairs Assistance: 1: +2 Total assist (for safety, max A) Stairs Assistance Details: Verbal cues for sequencing;Verbal cues for technique;Verbal cues for precautions/safety;Manual facilitation for weight shifting Stair Management Technique: Two rails;Step to pattern;Forwards Number of Stairs: 5 Height of Stairs: 4 Architect: Yes Wheelchair Assistance: 5: Investment banker, operational Details: Tactile cues for initiation;Verbal cues for sequencing;Verbal cues for technique;Verbal cues for Information systems manager: Both upper extremities;Both lower extermities Wheelchair Parts Management: Needs assistance Distance: 45  Trunk/Postural Assessment  Cervical Assessment Cervical Assessment: Exceptions to [facility] Cervical Strength Overall Cervical Strength Comments: keeps head slightly turned to the R, forward flexed cervical spine in stting and standing.  Thoracic Assessment Thoracic Assessment: Exceptions to [facility] Thoracic Strength Overall Thoracic Strength Comments: forward/rounded shoulders Lumbar Assessment Lumbar Assessment: Exceptions to  [facility] Lumbar Strength Overall Lumbar Strength Comments: Pt sits with posterior pelvic tilt Postural Control Postural Control: Deficits on evaluation Righting Reactions: Pt attempts to correct balance, however tends to raise leg off of floor and continues to fall to the R Protective Responses: Decreased protective response in sitting.   Balance Balance Balance Assessed: Yes Static Sitting Balance Static Sitting - Balance Support: Feet supported;Bilateral upper extremity supported Static Sitting - Level of Assistance: 4: Min assist;3: Mod assist Dynamic Sitting Balance Dynamic Sitting - Balance Support: Left upper extremity supported;Feet supported Sitting balance - Comments: Requires mod/max A at times to maintain midline posture.  Static Standing Balance Static Standing - Balance Support: During functional activity;Bilateral upper extremity supported Static Standing - Level of Assistance: 3: Mod assist Extremity Assessment      RLE Assessment RLE Assessment: Exceptions to [facility] RLE Strength RLE Overall Strength: Deficits RLE Overall Strength Comments: hip flex 2+/5, knee flex and ext 4/5, ankle DF/PF 4/5 LLE Assessment LLE Assessment: Within Functional Limits (trouble maintaining sitting balance)  FIM:  FIM - Bed/Chair Transfer Bed/Chair Transfer: 4: Supine > Sit: Min A (steadying Pt. > 75%/lift 1 leg);3: Bed > Chair or W/C: Mod A (lift or lower assist);2: Chair or W/C > Bed: Max A (lift and lower assist) FIM - Locomotion: Wheelchair Distance: 45 Locomotion: Wheelchair: 1: Travels less than 50 ft with supervision, cueing or coaxing FIM - Locomotion: Ambulation Locomotion: Ambulation Assistive Devices:  (L handrail) Ambulation/Gait Assistance: 3: Mod assist;1: +2 Total assist (+2 for chair follow) Locomotion: Ambulation: 1: Two helpers FIM - Locomotion: Stairs Locomotion: Scientist, physiological: Insurance account manager - 2 Locomotion: Stairs: 1: Two helpers   Refer to [employer] for Long  Term Goals  Recommendations for other services: None  Discharge Criteria: Patient will be discharged from PT if patient refuses treatment 3 consecutive times without medical reason, if treatment goals not met, if there is a change in medical status, if patient makes no progress towards goals or if patient is discharged from hospital.  The above assessment, treatment plan, treatment alternatives and goals were discussed and mutually agreed upon: by patient and by family  Denice Bors 10/01/2013, 10:21 AM

## 2013-10-02 ENCOUNTER — Encounter (HOSPITAL_COMMUNITY): Payer: Medicare HMO | Admitting: Occupational Therapy

## 2013-10-02 ENCOUNTER — Inpatient Hospital Stay (HOSPITAL_COMMUNITY): Payer: Medicare HMO | Admitting: Physical Therapy

## 2013-10-02 ENCOUNTER — Inpatient Hospital Stay (HOSPITAL_COMMUNITY): Payer: Medicare HMO | Admitting: Speech Pathology

## 2013-10-02 DIAGNOSIS — I619 Nontraumatic intracerebral hemorrhage, unspecified: Secondary | ICD-10-CM

## 2013-10-02 DIAGNOSIS — Z5189 Encounter for other specified aftercare: Secondary | ICD-10-CM

## 2013-10-02 DIAGNOSIS — I10 Essential (primary) hypertension: Secondary | ICD-10-CM

## 2013-10-02 DIAGNOSIS — I2510 Atherosclerotic heart disease of native coronary artery without angina pectoris: Secondary | ICD-10-CM

## 2013-10-02 NOTE — Plan of Care (Signed)
Problem: RH BOWEL ELIMINATION Goal: RH STG MANAGE BOWEL WITH ASSISTANCE STG Manage Bowel with MIN Assistance.  Outcome: Not Progressing Wears brief , incontinent of bowel

## 2013-10-02 NOTE — Plan of Care (Signed)
Problem: RH BLADDER ELIMINATION Goal: RH STG MANAGE BLADDER WITH ASSISTANCE STG Manage Bladder With Min Assistance  Outcome: Not Progressing Condom cath in place

## 2013-10-02 NOTE — Progress Notes (Signed)
78 y.o. right-handed male with history of CAD with cardiac arrest July 2015 and maintained on Plavix. CVA 2011 with little residual weakness. Independent prior to admission living with his wife. Presented 09/25/2013 with headache, nausea and vomiting as well as right-sided weakness. CT/MRI of the brain showed a 2.6 x 1.9 cm hematoma in the inferior right cerebellum. Neurosurgery consulted advise conservative care. Noted fall 09/27/2013 while in the hospital as patient attempted to get out of bed unassisted. A followup cranial CT scan completed showing interval decrease in size of right cerebellar hemorrhage with slightly increased ventriculomegaly  involving the lateral and third ventricles. Maintained on a dysphagia 2 nectar thick liquid diet. Subcutaneous heparin initiated for DVT prophylaxis 09/28/2013  Subjective/Complaints: PT was confused last noc at 3am thought it was time to go to therapy Review of Systems - Negative except R arm clumsy Objective: Vital Signs: Blood pressure 138/74, pulse 77, temperature 99.1 F (37.3 C), temperature source Oral, resp. rate 18, height 5' 11" (1.803 m), weight 77.792 kg (171 lb 8 oz), SpO2 98.00%. No results found. Results for orders placed during the hospital encounter of 09/30/13 (from the past 72 hour(s))  CBC WITH DIFFERENTIAL     Status: Abnormal   Collection Time    10/01/13  7:40 AM      Result Value Ref Range   WBC 10.7 (*) 4.0 - 10.5 K/uL   RBC 3.79 (*) 4.22 - 5.81 MIL/uL   Hemoglobin 10.2 (*) 13.0 - 17.0 g/dL   HCT 31.5 (*) 39.0 - 52.0 %   MCV 83.1  78.0 - 100.0 fL   MCH 26.9  26.0 - 34.0 pg   MCHC 32.4  30.0 - 36.0 g/dL   RDW 16.9 (*) 11.5 - 15.5 %   Platelets 452 (*) 150 - 400 K/uL   Neutrophils Relative % 79 (*) 43 - 77 %   Neutro Abs 8.5 (*) 1.7 - 7.7 K/uL   Lymphocytes Relative 10 (*) 12 - 46 %   Lymphs Abs 1.1  0.7 - 4.0 K/uL   Monocytes Relative 9  3 - 12 %   Monocytes Absolute 1.0  0.1 - 1.0 K/uL   Eosinophils Relative 2  0 - 5  %   Eosinophils Absolute 0.2  0.0 - 0.7 K/uL   Basophils Relative 0  0 - 1 %   Basophils Absolute 0.0  0.0 - 0.1 K/uL  COMPREHENSIVE METABOLIC PANEL     Status: Abnormal   Collection Time    10/01/13  7:40 AM      Result Value Ref Range   Sodium 133 (*) 137 - 147 mEq/L   Potassium 4.1  3.7 - 5.3 mEq/L   Chloride 94 (*) 96 - 112 mEq/L   CO2 24  19 - 32 mEq/L   Glucose, Bld 117 (*) 70 - 99 mg/dL   BUN 14  6 - 23 mg/dL   Creatinine, Ser 1.11  0.50 - 1.35 mg/dL   Calcium 9.1  8.4 - 10.5 mg/dL   Total Protein 7.6  6.0 - 8.3 g/dL   Albumin 2.5 (*) 3.5 - 5.2 g/dL   AST 11  0 - 37 U/L   ALT 10  0 - 53 U/L   Alkaline Phosphatase 71  39 - 117 U/L   Total Bilirubin 0.2 (*) 0.3 - 1.2 mg/dL   GFR calc non Af Amer 63 (*) >90 mL/min   GFR calc Af Amer 73 (*) >90 mL/min   Comment: (NOTE)       The eGFR has been calculated using the CKD EPI equation.     This calculation has not been validated in all clinical situations.     eGFR's persistently <90 mL/min signify possible Chronic Kidney     Disease.   Anion gap 15  5 - 15     HEENT: normal and nystagmus Cardio: RRR and no murmur Resp: CTA B/L GI: BS positive and NT, ND Extremity:  Pulses positive and No Edema Skin:   Intact Neuro: Alert/Oriented, Cranial Nerve Abnormalities nystagmus horz, Normal Sensory, Abnormal Motor 4/5 in BUE and BLE and Abnormal FMC Ataxic/ dec FMC Musc/Skel:  Normal Gen NAD Dysmetria and pass pointing with R FNF  Assessment/Plan: 1. Functional deficits secondary to RIght cerebellar ICH which require 3+ hours per day of interdisciplinary therapy in a comprehensive inpatient rehab setting. Physiatrist is providing close team supervision and 24 hour management of active medical problems listed below. Physiatrist and rehab team continue to assess barriers to discharge/monitor patient progress toward functional and medical goals. FIM: FIM - Bathing Bathing Steps Patient Completed: Chest;Right Arm;Left  Arm;Abdomen;Front perineal area;Right upper leg;Left upper leg Bathing: 3: Mod-Patient completes 5-7 56f10 parts or 50-74%  FIM - Upper Body Dressing/Undressing Upper body dressing/undressing: 0: Wears gown/pajamas-no public clothing FIM - Lower Body Dressing/Undressing Lower body dressing/undressing: 0: Wears gInterior and spatial designer    FIM - TRadio producerDevices: BRecruitment consultantTransfers: 3-To toilet/BSC: Mod A (lift or lower assist)  FIM - BIT sales professionalTransfer: 4: Supine > Sit: Min A (steadying Pt. > 75%/lift 1 leg);3: Bed > Chair or W/C: Mod A (lift or lower assist)  FIM - Locomotion: Wheelchair Distance: 45 Locomotion: Wheelchair: 1: Travels less than 50 ft with supervision, cueing or coaxing FIM - Locomotion: Ambulation Locomotion: Ambulation Assistive Devices:  (L handrail) Ambulation/Gait Assistance: 3: Mod assist;1: +2 Total assist (+2 for chair follow) Locomotion: Ambulation: 1: Two helpers  Comprehension Comprehension Mode: Auditory Comprehension: 4-Understands basic 75 - 89% of the time/requires cueing 10 - 24% of the time  Expression Expression Mode: Verbal Expression: 4-Expresses basic 75 - 89% of the time/requires cueing 10 - 24% of the time. Needs helper to occlude trach/needs to repeat words.  Social Interaction Social Interaction: 4-Interacts appropriately 75 - 89% of the time - Needs redirection for appropriate language or to initiate interaction.  Problem Solving Problem Solving: 3-Solves basic 50 - 74% of the time/requires cueing 25 - 49% of the time  Memory Memory: 3-Recognizes or recalls 50 - 74% of the time/requires cueing 25 - 49% of the time  Medical Problem List and Plan:   1. Functional deficits secondary to right cerebellar ICH.   2. DVT Prophylaxis/Anticoagulation: Subcutaneous heparin initiated 09/28/2013. Monitor for any bleeding episodes   3. Pain Management: , Hydrocodone as  needed. Monitor with increased mobility   4. Dysphagia. Dysphagia 2 nectar liquids. Monitor for any signs of aspiration. Followup speech therapy   5. Neuropsych: This patient is capable of making decisions on his own behalf.   6. Skin/Wound Care: Routine skin checks   7. Hypertension. Lasix 40 mg daily, Imdur 60 mg daily, Cozaar 50 mg daily, Lopressor 100 mg twice a day.   8. History of coronary artery disease with cardiac arrest July 2015. No chest pain or shortness of breath. Patient on Plavix prior to admission currently on hold secondary to IButler Remains on aspirin 81 mg daily. Followup cardiology Dr. NMeda Coffeeas needed   9. Hypothyroidism. Synthroid. Latest  TSH level 4.173   10. UTI. Urine study 09/28/2013 positive nitrite many bacteria continue Bactrim for now    LOS (Days) 2 A FACE TO FACE EVALUATION WAS PERFORMED  KIRSTEINS,ANDREW E 10/02/2013, 8:45 AM    

## 2013-10-02 NOTE — Progress Notes (Addendum)
Physical Therapy Vestibular Assessment and Plan  Patient Details  Name: Taylor Keller MRN: [id number] Date of Birth: 07/29/1935  PT Diagnosis: Vertigo of central origin  Today's Date: 10/02/2013 PT Individual Time: 1062-6948 PT Individual Time Calculation (min): 61 min    Problem List:  Patient Active Problem List   Diagnosis Date Noted  . ICH (intracerebral hemorrhage) 09/25/2013  . Chest pain at rest 09/08/2013  . H/O cardiac arrest July 2015 09/08/2013  . CAD- s/p LAD and CFX stent July 2015 09/08/2013  . Acute CHF 09/08/2013  . Internal carotid artery stenosis   . Hypothyroidism   . Hyperlipidemia   . Hypertension   . ED (erectile dysfunction)     Past Medical History:  Past Medical History  Diagnosis Date  . Lumbar stenosis     L5  . Hypothyroidism   . Hyperlipidemia   . Hypertension   . ED (erectile dysfunction)   . Internal carotid artery stenosis 5/15    bilateral, 40-59%  . Myocardial infarction 08/2013  . Heart murmur   . Stroke ~ 2011    "slight; no permanent damage"   Past Surgical History:  Past Surgical History  Procedure Laterality Date  . Tonsillectomy and adenoidectomy    . Appendectomy    . Coronary angioplasty with stent placement  08/2013    "2"  . Cataract extraction, bilateral Bilateral ~ 2012   Subjective  Pt reports 5/10 subjective dizziness/disequilibrium at rest. Per pt, dizziness rating can be as low as 1/10 when "my head stays still for a while," per pt. With increased head/body movement, pt stated that people/objects appear to be "dancing," per pt. Per pt/wife, pt has had increased difficulty eating secondary to nausea.  Evaluation Hearing Diminished in L ear  Eye Alignment  Initially WNL; noted slight L eye abduction with increased fatigue  Spontaneous  Nystagmus  (+) R horizontal nystagmus noted at rest ~3 minutes after pt performed head movements  Gaze holding nystagmus  (+) R horizontal nystagmus with tracking in all  quadrants (intensity with R monocular vision > biocular vision).  (+) L-beating nystagmus with superior tracking with L monocular vision.  (+) L horizontal direction-changing nystagmus in L eye.  Smooth pursuit  Abnormal. - Catch up saccades noted (R eye > L eye) with horizontal tracking in bilat directions.  - Biocular vision: ocular ROM grossly WFL; eyes veer to L of midline with vertical tracking.  Oculomotor  - Convergence abnormal; decreased L eye adduction - Pt reports diplopia in all visual fields at times of evaluation; appears to be more common/prominent in R visual field and with targets at increased distance  Saccades  Abnormal. - Overshooting noted with horizontal saccades (R > L) - With inferior/superior saccades, bilat eyes appear to move to target while veering to L of midline.   VOR slow  Abnormal with horizontal head movement. Testing on vertical VOR deferred to later session secondary to onset of nausea.  Head Thrust Test  Defer to later session secondary to decreased pt tolerance to testing at end of evaluation.  RtMarye Round  Positional vertigo not formally assessed secondary to no history of recent fall and symptoms not consistent with those of BPPV.  LtJerelyn Charles Dix    Rt. Roll Test    Lt. Roll Test    Motion sensitivity  (+) supine>sit, horizontal head movements  VOR Cancellation  Defer to later session; see above.  Head Shaking Nystagmus Defer to later session; see above.  Visual- Vestibular Interactions:  - Sitting: subjective dizziness/disequilibrium rating increased from 6/10 to 10/10 with supine>sit. In seated, pt demonstrated signs of increased disequilibrium (holding tightly to bed rails with bilat hands). With use of visual target x2 minutes, pt reported dizziness rating improved to 8/10. Evaluation ended at this time secondary to significant nausea. - Standing/Walking: Not formally assessed due to significant nausea at end of evaluation; see initial PT  evaluation for further detail.  Findings: Exam findings suggestive of vertigo of central origin and motion sensitivity. Specifically, pt demonstrates impaired oculomotor coordination with horizontal saccades (overshooting); impaired horizontal smooth pursuit (catchup saccades); impaired convergence (L eye); diplopia; spontaneous horizontal R-beating nystagmus; direction-changing nystagmus in L eye.   Plan:  1. Adaptation: VOR (x1 viewing with horizontal head turning) starting in semi reclined then progressing to seated, then standing. Expose pt to functional head movement (focus on supine<>sit, sit<>stand, and ambulation) to facilitate nervous system adaptation to loss of vestibular input. Monitor subjective dizziness rating (0-10) and pause/rest until rating is within 2 of baseline rating. 2.  Compensation: if necessary, may teach/reinforce compensatory strategies to increase pt participation in therapies and pt tolerance to functional mobility. Examples: visual fixation, use of assistive device, and monocular vision (if effective in increasing pt tolerance to activity). When turning, instruct pt to first move eyes to target, followed by head toward target, then body toward target. 3. Habituation: by exposing pt to noxious stimuli (supine>sit, sit<>stand, horizontal head movements), symptoms caused by position/movement will decrease over time. Goal is production of mild, temporary symptoms. (up to 6-7/10 intensity).  Stefano Gaul 10/02/2013, 12:54 PM

## 2013-10-02 NOTE — Progress Notes (Signed)
Chart and note reviewed.  Molli Barrows, RD, LDN, [place] Pager [phone] After Hours Pager [phone]

## 2013-10-02 NOTE — Progress Notes (Signed)
Speech Language Pathology Daily Session Note  Patient Details  Name: Taylor Keller MRN: [id number] Date of Birth: [birth year]  Today's Date: 10/02/2013 SLP Individual Time: 1191-4782 SLP Individual Time Calculation (min): 66 min  Short Term Goals: Week 1: SLP Short Term Goal 1 (Week 1): Pt will utilize an increased vocal intensity at the sentence level with supervision verbal cues.  SLP Short Term Goal 2 (Week 1): Pt will utilize swallowing compensatory strategies with current diet to minimize overt s/s of aspiration with Min A multimodal cues.  SLP Short Term Goal 3 (Week 1): Pt will demonstrate selective attention in a mildly distracting enviornment with supervision verbal cues for 60 minutes.  SLP Short Term Goal 4 (Week 1): Pt will demonstrate functional problem solving for basic and familiar tasks with Min A multimodal cues.  SLP Short Term Goal 5 (Week 1): Pt will recall new, daily information with use of external aids with supervision multimodal cues.   Skilled Therapeutic Interventions:  Pt was seen for skilled speech therapy targeting cognitive goals and family education.  Upon arrival, pt was reclined in bed, lethargic, but agreeable to participate in [place].  SLP facilitated improved alertness by having pt sit at the edge of the bed during structured tasks.  SLP initiated skilled education related to diaphragmatic breathing to improve vocal intensity for speech with pt able to return demonstration of recommended techniques with min assist.  Additionally, pt was able to recall recommended techniques after a 2-3 minute delay with min question cues.   SLP also facilitated the session with a basic card game targeting selective attention and problem solving with pt requiring mod faded to min assist to complete task for >80% accuracy.  Pt's wife was present for the duration of the therapy session and had questions regarding pt's current diet and limited PO intake; therefore, SLP initiated  skilled education related to pt's currently recommended diet and provided pt's wife with a handout of dys 3 consistencies as she expressed interest in brining foods from home.  Pt's wife verbalized appropriate choices of meals to bring in from home with the use of handout.  RN made aware.  Pt also demonstrated understanding regarding his currently prescribed diet and swallowing precautions by answering targeted questions for ~90% accuracy with supervision cues.    FIM:  Comprehension Comprehension Mode: Auditory Comprehension: 4-Understands basic 75 - 89% of the time/requires cueing 10 - 24% of the time Expression Expression Mode: Verbal Expression: 4-Expresses basic 75 - 89% of the time/requires cueing 10 - 24% of the time. Needs helper to occlude trach/needs to repeat words. Social Interaction Social Interaction: 5-Interacts appropriately 90% of the time - Needs monitoring or encouragement for participation or interaction. Problem Solving Problem Solving: 3-Solves basic 50 - 74% of the time/requires cueing 25 - 49% of the time Memory Memory: 3-Recognizes or recalls 50 - 74% of the time/requires cueing 25 - 49% of the time FIM - Eating Eating Activity: 5: Supervision/cues  Pain Pain Assessment Pain Assessment: No/denies pain  Therapy/Group: Individual Therapy  Windell Moulding, M.A. CCC-SLP  Maxie Debose, Selinda Orion 10/02/2013, 3:55 PM

## 2013-10-02 NOTE — Progress Notes (Signed)
Occupational Therapy Session Note  Patient Details  Name: Taylor Keller MRN: [id number] Date of Birth: [birth year]  Today's Date: 10/02/2013 OT Individual Time: 5809-9833 OT Individual Time Calculation (min): 60 min   Short Term Goals: Week 1:  OT Short Term Goal 1 (Week 1): Pt will complete bathing with min assist at sit > stand level OT Short Term Goal 2 (Week 1): Pt will complete LB dressing with mod assist  OT Short Term Goal 3 (Week 1): Pt will complete toilet transfer with min assist OT Short Term Goal 4 (Week 1): Pt will complete 2/4 grooming tasks in standing with min assist  Skilled Therapeutic Interventions/Progress Updates:    Engaged in ADL retraining with focus on functional transfers, sit <> stand, standing tolerance, and increased independence with self-care tasks of bathing and dressing.  Pt in bed upon arrival, reports mild nausea upon sitting EOB however pt with no vomiting during session.  Completed stand step transfer to w/c with mod assist secondary to Rt lean with transfer.  Bathing completed at sit > stand level in room shower, performed stand step transfer requiring mod assist due to Rt lean with sidestepping pattern into shower.  Pt with appropriate sitting balance during bathing in shower, provided physical assist to steady in standing and assisted with perineal hygiene due to pt's fear of falling and not wanting to release grab bars.  Educated on squat pivot to exit shower with pt with increased participation, still requiring mod assist for technique.  Dressing completed at sit> stand level with ability to don brief and shorts with crossing leg over opposite knee and mod verbal cues for hand placement with sit > stand and pt again unwilling to release sink to assist in pulling pants over hips.  Pt requested to return to bed at end of session, mod assist stand pivot to bed.  Pt's wife present and providing appropriate support throughout session.  Therapy  Documentation Precautions:  Precautions Precautions: Fall Precaution Comments: dizziness w/ all movement, nystagmus w/ R gaze, uncoordinated movements Restrictions Weight Bearing Restrictions: No Pain:  Pt with no c/o pain ADL: ADL ADL Comments: See FIM  See FIM for current functional status  Therapy/Group: Individual Therapy  Simonne Come 10/02/2013, 10:48 AM

## 2013-10-03 ENCOUNTER — Inpatient Hospital Stay (HOSPITAL_COMMUNITY): Payer: Medicare HMO | Admitting: Speech Pathology

## 2013-10-03 ENCOUNTER — Ambulatory Visit (HOSPITAL_COMMUNITY): Payer: Medicare HMO

## 2013-10-03 ENCOUNTER — Inpatient Hospital Stay (HOSPITAL_COMMUNITY): Payer: Medicare HMO | Admitting: Physical Therapy

## 2013-10-03 ENCOUNTER — Encounter (HOSPITAL_COMMUNITY): Payer: Medicare HMO | Admitting: Occupational Therapy

## 2013-10-03 MED ORDER — BACLOFEN 10 MG PO TABS
10.0000 mg | ORAL_TABLET | Freq: Three times a day (TID) | ORAL | Status: DC
  Administered 2013-10-03 – 2013-10-05 (×8): 10 mg via ORAL
  Filled 2013-10-03 (×13): qty 1

## 2013-10-03 NOTE — Progress Notes (Signed)
Physical Therapy Session Note  Patient Details  Name: Taylor Keller MRN: [id number] Date of Birth: [birth year]  Today's Date: 10/03/2013 PT Individual Time: 2035-5974 Time Calculation (min): 64 min  Short Term Goals: Week 1:  PT Short Term Goal 1 (Week 1): Pt will perform dynamic sitting balance w/ min A and min cues for midline orientation.  PT Short Term Goal 2 (Week 1): Pt will perform stand pivot transfer consistently at mod A.  PT Short Term Goal 3 (Week 1): Pt will report dizziness <5/10 during visual/vestibular exercises PT Short Term Goal 4 (Week 1): Pt will ambulate x 50' w/ LRAD at mod A PT Short Term Goal 5 (Week 1): Pt will self propel w/c x 100' at S level  Skilled Therapeutic Interventions/Progress Updates:    Pt received seated in w/c; agreeable to therapy but reports significant fatigue after showering during OT B&D. Session focused on facilitating nervous system adaptation to vestibular loss and reinforcing pt use of compensatory strategies when unable to tolerate functional mobility. Baseline subjective dizzines rating: 4/10. Pt performed w/c mobility x65' total in controlled environment with bilat UE's and min A for RUE technique with inconsistent carryover; w/c trial ended per pt request secondary to significant fatigue and "wooziness". Transported pt remainder of distance to treatment gym, where pt performed squat pivot transfer from w/c<>mat table with mod A and multimodal cueing for setup and technique.   After initial squat pivot transfer, pt reported subjective dizziness rating of 7/10 and demonstrated significant truncal ataxia and lateral trunk lean to R side. Verbal cueing provided to encourage use of visual target ("A") to compensate Despite improved dizziness rating (4/10), decreased truncal ataxia, and improved midline orientation with use of target, pt required frequent reinforcement to utilize strategy with increased symptoms. Performed sit<>stand transfers x3  with min-mod A from elevated mat table height. Pt performed static standing x3 trials total with mod-max A, R HHA, and mirror positioned anterior to pt to increase postural awareness/control. In static standing, pt exhibits posterior preference, lateral trunk lean to L side. Also noted increasingly more prominent multidirectional postural sway with increased time. Longest trial: 45 seconds. Following final standing trial, pt reported 8/10 dizziness and nausea.  Due to pt nausea, remainder of session focused on educating pt/wife on Brock string exercise to address impaired L eye convergence, diplopia. Provided glasses with R vision distorted and instructed wife to use glasses to progress from L monocular exercise to biocular execise. Wife verbalized understanding and gave effective return demonstration. Session ended in pt room, where pt was left seated in w/c with wife present; pt in no apparent distress. Post-session dizziness rating: 6/10.   Therapy Documentation Precautions:  Precautions Precautions: Fall Precaution Comments: dizziness w/ all movement, nystagmus w/ R gaze, uncoordinated movements Restrictions Weight Bearing Restrictions: No Vital Signs: Therapy Vitals Temp: 97.8 F (36.6 C) Temp src: Oral Pulse Rate: 69 Resp: 18 BP: 97/84 mmHg Oxygen Therapy SpO2: 98 % O2 Device: None (Room air) Pain: Pain Assessment Pain Assessment: No/denies pain  See FIM for current functional status  Therapy/Group: Individual Therapy  [place], Malva Cogan 10/03/2013, 4:32 PM

## 2013-10-03 NOTE — IPOC Note (Signed)
Overall Plan of Care [facility]) Patient Details Name: Taylor Keller MRN: [id number] DOB: 1935-07-19  Admitting Diagnosis: l cerebellar ICH   Hospital Problems: Active Problems:   ICH (intracerebral hemorrhage)     Functional Problem List: Nursing Bladder;Bowel;Endurance;Perception;Safety;Skin Integrity  PT Balance;Endurance;Motor;Safety  OT Balance;Cognition;Endurance;Motor;Safety;Vision  SLP Cognition;Nutrition  TR         Basic ADL's: OT Grooming;Bathing;Dressing;Toileting     Advanced  ADL's: OT       Transfers: PT Bed Mobility;Bed to Chair;Car;Furniture;Floor  OT Toilet;Tub/Shower     Locomotion: PT Ambulation;Wheelchair Mobility;Stairs     Additional Impairments: OT    SLP Swallowing;Communication;Social Cognition expression Problem Solving;Memory;Attention;Awareness;Social Interaction  TR      Anticipated Outcomes Item Anticipated Outcome  Self Feeding independent  Swallowing  supervision with least restrictive diet    Basic self-care  supervision  Toileting  supervision   Bathroom Transfers supervision  Bowel/Bladder  Min A  Transfers  Supervision  Locomotion  Supervision  Communication  Mod I with increased vocal intensity   Cognition  supervision   Pain  <3  Safety/Judgment  Supervision   Therapy Plan: PT Intensity: Minimum of 1-2 x/day ,45 to 90 minutes PT Frequency: 5 out of 7 days PT Duration Estimated Length of Stay: 21-23 days  OT Intensity: Minimum of 1-2 x/day, 45 to 90 minutes OT Frequency: 5 out of 7 days OT Duration/Estimated Length of Stay: 3 weeks SLP Intensity: Minumum of 1-2 x/day, 30 to 90 minutes SLP Frequency: 5 out of 7 days SLP Duration/Estimated Length of Stay: 3 weeks        Team Interventions: Nursing Interventions Skin Care/Wound Management;Patient/Family Education  PT interventions Ambulation/gait training;Balance/vestibular training;Cognitive remediation/compensation;Discharge planning;Disease  management/prevention;DME/adaptive equipment instruction;Functional electrical stimulation;Functional mobility training;Neuromuscular re-education;Patient/family education;Stair training;Therapeutic Activities;Therapeutic Exercise;UE/LE Strength taining/ROM;UE/LE Coordination activities;Visual/perceptual remediation/compensation;Wheelchair propulsion/positioning  OT Interventions Balance/vestibular training;Cognitive remediation/compensation;Community reintegration;Discharge planning;Disease mangement/prevention;DME/adaptive equipment instruction;Functional mobility training;Neuromuscular re-education;Pain management;Patient/family education;Psychosocial support;Self Care/advanced ADL retraining;Therapeutic Activities;Therapeutic Exercise;UE/LE Strength taining/ROM;UE/LE Coordination activities;Visual/perceptual remediation/compensation  SLP Interventions Cognitive remediation/compensation;Cueing hierarchy;Dysphagia/aspiration precaution training;Functional tasks;Environmental controls;Internal/external aids;Speech/Language facilitation;Patient/family education;Therapeutic Activities  TR Interventions    SW/CM Interventions Discharge Planning;Psychosocial Support;Patient/Family Education    Team Discharge Planning: Destination: PT-Home ,OT- Home , SLP-Home Projected Follow-up: PT-Home health PT;24 hour supervision/assistance, OT-  Outpatient OT, SLP-Home Health SLP;Outpatient SLP;24 hour supervision/assistance Projected Equipment Needs: PT-To be determined, OT- Tub/shower seat, SLP-None recommended by SLP Equipment Details: PT- , OT-reports has a BSC Patient/family involved in discharge planning: PT- Patient;Family member/caregiver,  OT-Patient;Family member/caregiver, SLP-Patient;Family member/caregiver  MD ELOS: 14-20d Medical Rehab Prognosis:  Good Assessment: 78 y.o. right-handed male with history of CAD with cardiac arrest July 2015 and maintained on Plavix. CVA 2011 with little residual  weakness. Independent prior to admission living with his wife. Presented 09/25/2013 with headache, nausea and vomiting as well as right-sided weakness. CT/MRI of the brain showed a 2.6 x 1.9 cm hematoma in the inferior right cerebellum. Neurosurgery consulted advise conservative care. Noted fall 09/27/2013 while in the hospital as patient attempted to get out of bed unassisted. A followup cranial CT scan completed showing interval decrease in size of right cerebellar hemorrhage with slightly increased ventriculomegaly  involving the lateral and third ventricles. Maintained on a dysphagia 2 nectar thick liquid diet   Now requiring 24/7 Rehab RN,MD, as well as CIR level PT, OT and SLP.  Treatment team will focus on ADLs, cognition and mobility with goals set at supervision  See Team Conference Notes for weekly updates to the plan of care

## 2013-10-03 NOTE — Progress Notes (Signed)
78 y.o. right-handed male with history of CAD with cardiac arrest July 2015 and maintained on Plavix. CVA 2011 with little residual weakness. Independent prior to admission living with his wife. Presented 09/25/2013 with headache, nausea and vomiting as well as right-sided weakness. CT/MRI of the brain showed a 2.6 x 1.9 cm hematoma in the inferior right cerebellum. Neurosurgery consulted advise conservative care. Noted fall 09/27/2013 while in the hospital as patient attempted to get out of bed unassisted. A followup cranial CT scan completed showing interval decrease in size of right cerebellar hemorrhage with slightly increased ventriculomegaly  involving the lateral and third ventricles. Maintained on a dysphagia 2 nectar thick liquid diet. Subcutaneous heparin initiated for DVT prophylaxis 09/28/2013  Subjective/Complaints: Pt asking again about lasix, cardiology saw pt during this hospitalization and continued this med C/O hiccups Review of Systems - Negative except R arm clumsy Objective: Vital Signs: Blood pressure 130/57, pulse 68, temperature 98.7 F (37.1 C), temperature source Oral, resp. rate 18, height 5' 11"  (1.803 m), weight 77.792 kg (171 lb 8 oz), SpO2 98.00%. No results found. Results for orders placed during the hospital encounter of 09/30/13 (from the past 72 hour(s))  CBC WITH DIFFERENTIAL     Status: Abnormal   Collection Time    10/01/13  7:40 AM      Result Value Ref Range   WBC 10.7 (*) 4.0 - 10.5 K/uL   RBC 3.79 (*) 4.22 - 5.81 MIL/uL   Hemoglobin 10.2 (*) 13.0 - 17.0 g/dL   HCT 31.5 (*) 39.0 - 52.0 %   MCV 83.1  78.0 - 100.0 fL   MCH 26.9  26.0 - 34.0 pg   MCHC 32.4  30.0 - 36.0 g/dL   RDW 16.9 (*) 11.5 - 15.5 %   Platelets 452 (*) 150 - 400 K/uL   Neutrophils Relative % 79 (*) 43 - 77 %   Neutro Abs 8.5 (*) 1.7 - 7.7 K/uL   Lymphocytes Relative 10 (*) 12 - 46 %   Lymphs Abs 1.1  0.7 - 4.0 K/uL   Monocytes Relative 9  3 - 12 %   Monocytes Absolute 1.0  0.1 -  1.0 K/uL   Eosinophils Relative 2  0 - 5 %   Eosinophils Absolute 0.2  0.0 - 0.7 K/uL   Basophils Relative 0  0 - 1 %   Basophils Absolute 0.0  0.0 - 0.1 K/uL  COMPREHENSIVE METABOLIC PANEL     Status: Abnormal   Collection Time    10/01/13  7:40 AM      Result Value Ref Range   Sodium 133 (*) 137 - 147 mEq/L   Potassium 4.1  3.7 - 5.3 mEq/L   Chloride 94 (*) 96 - 112 mEq/L   CO2 24  19 - 32 mEq/L   Glucose, Bld 117 (*) 70 - 99 mg/dL   BUN 14  6 - 23 mg/dL   Creatinine, Ser 1.11  0.50 - 1.35 mg/dL   Calcium 9.1  8.4 - 10.5 mg/dL   Total Protein 7.6  6.0 - 8.3 g/dL   Albumin 2.5 (*) 3.5 - 5.2 g/dL   AST 11  0 - 37 U/L   ALT 10  0 - 53 U/L   Alkaline Phosphatase 71  39 - 117 U/L   Total Bilirubin 0.2 (*) 0.3 - 1.2 mg/dL   GFR calc non Af Amer 63 (*) >90 mL/min   GFR calc Af Amer 73 (*) >90 mL/min   Comment: (NOTE)  The eGFR has been calculated using the CKD EPI equation.     This calculation has not been validated in all clinical situations.     eGFR's persistently <90 mL/min signify possible Chronic Kidney     Disease.   Anion gap 15  5 - 15     HEENT: normal and nystagmus Cardio: RRR and no murmur Resp: CTA B/L GI: BS positive and NT, ND Extremity:  Pulses positive and No Edema Skin:   Intact Neuro: Alert/Oriented, Cranial Nerve Abnormalities nystagmus horz, Normal Sensory, Abnormal Motor 4/5 in BUE and BLE and Abnormal FMC Ataxic/ dec FMC Musc/Skel:  Normal Gen NAD Dysmetria and pass pointing with R FNF  Assessment/Plan: 1. Functional deficits secondary to RIght cerebellar ICH which require 3+ hours per day of interdisciplinary therapy in a comprehensive inpatient rehab setting. Physiatrist is providing close team supervision and 24 hour management of active medical problems listed below. Physiatrist and rehab team continue to assess barriers to discharge/monitor patient progress toward functional and medical goals. FIM: FIM - Bathing Bathing Steps Patient  Completed: Chest;Right Arm;Left Arm;Abdomen;Front perineal area;Right upper leg;Left upper leg Bathing: 3: Mod-Patient completes 5-7 [id number] parts or 50-74%  FIM - Upper Body Dressing/Undressing Upper body dressing/undressing steps patient completed: Thread/unthread right sleeve of pullover shirt/dresss;Thread/unthread left sleeve of pullover shirt/dress;Put head through opening of pull over shirt/dress;Pull shirt over trunk Upper body dressing/undressing: 5: Set-up assist to: Obtain clothing/put away FIM - Lower Body Dressing/Undressing Lower body dressing/undressing steps patient completed: Thread/unthread right underwear leg;Thread/unthread left underwear leg;Thread/unthread right pants leg;Thread/unthread left pants leg;Fasten/unfasten pants Lower body dressing/undressing: 3: Mod-Patient completed 50-74% of tasks     FIM - TRadio producerDevices: BRecruitment consultantTransfers: 3-To toilet/BSC: Mod A (lift or lower assist)  FIM - BControl and instrumentation engineerDevices: Arm rests;HOB elevated Bed/Chair Transfer: 5: Sit > Supine: Supervision (verbal cues/safety issues);4: Supine > Sit: Min A (steadying Pt. > 75%/lift 1 leg);4: Bed > Chair or W/C: Min A (steadying Pt. > 75%)  FIM - Locomotion: Wheelchair Distance: 45 Locomotion: Wheelchair: 0: Activity did not occur FIM - Locomotion: Ambulation Locomotion: Ambulation Assistive Devices:  (L handrail) Ambulation/Gait Assistance: 3: Mod assist;1: +2 Total assist (+2 for chair follow) Locomotion: Ambulation: 0: Activity did not occur  Comprehension Comprehension Mode: Auditory Comprehension: 4-Understands basic 75 - 89% of the time/requires cueing 10 - 24% of the time  Expression Expression Mode: Verbal Expression: 4-Expresses basic 75 - 89% of the time/requires cueing 10 - 24% of the time. Needs helper to occlude trach/needs to repeat words.  Social Interaction Social Interaction:  5-Interacts appropriately 90% of the time - Needs monitoring or encouragement for participation or interaction.  Problem Solving Problem Solving: 3-Solves basic 50 - 74% of the time/requires cueing 25 - 49% of the time  Memory Memory: 3-Recognizes or recalls 50 - 74% of the time/requires cueing 25 - 49% of the time  Medical Problem List and Plan:   1. Functional deficits secondary to right cerebellar ICH.   2. DVT Prophylaxis/Anticoagulation: Subcutaneous heparin initiated 09/28/2013. Monitor for any bleeding episodes   3. Pain Management: , Hydrocodone as needed. Monitor with increased mobility   4. Dysphagia. Dysphagia 2 nectar liquids. Monitor for any signs of aspiration. Followup speech therapy   5. Neuropsych: This patient is capable of making decisions on his own behalf.   6. Skin/Wound Care: Routine skin checks   7. Hypertension. Lasix 40 mg daily, Imdur 60 mg daily, Cozaar 50 mg  daily, Lopressor 100 mg twice a day.   8. History of coronary artery disease with cardiac arrest July 2015. No chest pain or shortness of breath. Patient on Plavix prior to admission currently on hold secondary to [place]. Remains on aspirin 81 mg daily. Followup cardiology Dr. Meda Coffee as needed   9. Hypothyroidism. Synthroid. Latest TSH level 4.173   10. UTI. Urine study 09/28/2013 positive nitrite many bacteria continue Bactrim for 7 days cult not sent, repeat after tx 11. Hiccups-trial baclofen, CVA related LOS (Days) 3 A FACE TO FACE EVALUATION WAS PERFORMED  KIRSTEINS,ANDREW E 10/03/2013, 8:12 AM

## 2013-10-03 NOTE — Progress Notes (Signed)
Occupational Therapy Session Note  Patient Details  Name: Taylor Keller MRN: [id number] Date of Birth: [birth year]  Today's Date: 10/03/2013 OT Individual Time: 3009-2330 OT Individual Time Calculation (min): 55 min    Short Term Goals: Week 1:  OT Short Term Goal 1 (Week 1): Pt will complete bathing with min assist at sit > stand level OT Short Term Goal 2 (Week 1): Pt will complete LB dressing with mod assist  OT Short Term Goal 3 (Week 1): Pt will complete toilet transfer with min assist OT Short Term Goal 4 (Week 1): Pt will complete 2/4 grooming tasks in standing with min assist  Skilled Therapeutic Interventions/Progress Updates:    Engaged in ADL retraining with focus on functional transfers, sit <> stand, standing tolerance, and increased independence with self-care tasks of bathing and dressing. Pt in bed upon arrival, reports mild nausea upon sitting EOB however pt with no vomiting during session.  Discussed use and purpose of "A" in room for gaze stabilization when head turns to decrease dizziness and nausea.  Educated on squat pivot transfer with pt with difficulty carrying over, therefore completed stand step transfer to w/c with mod assist secondary to Rt lean with transfer. Bathing completed at sit > stand level in room shower, performed stand step transfer requiring mod assist due to Rt lean with sidestepping pattern into shower. Pt with increased posterior lean in standing in shower when attempting to wash buttocks and when standing to attempt to pull up pants. Educated on squat pivot to exit shower with pt with increased participation, still requiring mod assist for technique. Dressing completed at sit > stand level at sink with education on hemi-dressing technique for LB dressing and to cross leg over opposite knee to increase independence.  Pt assisted with pulling brief and pants over hips, but still required assist with maintaining standing balance and fully pulling up  pants.  Pt missed 5 mins secondary to friends visiting and pt requesting to visit with them.  Therapy Documentation Precautions:  Precautions Precautions: Fall Precaution Comments: dizziness w/ all movement, nystagmus w/ R gaze, uncoordinated movements Restrictions Weight Bearing Restrictions: No Pain: Pain Assessment Pain Score: 0-No pain ADL: ADL ADL Comments: See FIM  See FIM for current functional status  Therapy/Group: Individual Therapy  Simonne Come 10/03/2013, 10:51 AM

## 2013-10-03 NOTE — Progress Notes (Signed)
Speech Language Pathology Daily Session Note  Patient Details  Name: Taylor Keller MRN: [id number] Date of Birth: Jan 30, 1936  Today's Date: 10/03/2013 SLP Individual Time: 1303-1404 SLP Individual Time Calculation (min): 61 min  Short Term Goals: Week 1: SLP Short Term Goal 1 (Week 1): Pt will utilize an increased vocal intensity at the sentence level with supervision verbal cues.  SLP Short Term Goal 2 (Week 1): Pt will utilize swallowing compensatory strategies with current diet to minimize overt s/s of aspiration with Min A multimodal cues.  SLP Short Term Goal 3 (Week 1): Pt will demonstrate selective attention in a mildly distracting enviornment with supervision verbal cues for 60 minutes.  SLP Short Term Goal 4 (Week 1): Pt will demonstrate functional problem solving for basic and familiar tasks with Min A multimodal cues.  SLP Short Term Goal 5 (Week 1): Pt will recall new, daily information with use of external aids with supervision multimodal cues.   Skilled Therapeutic Interventions:  Pt was seen for skilled speech therapy targeting cognitive goals.  Upon arrival, pt was seated upright in wheelchair with wife present, shaving face with an Copy.  Pt benefited from supervision question cues to complete task in a timely manner as he was noted to perseverate on one area of his face.  Pt's wife was present for the duration of the treatment session and reported improved PO intake with dys 3 solids.  Pt requested to have fried shrimp brought in for lunch.  SLP recommended to pt and pt's wife that trials of regular solids be completed with SLP present.  Pt and pt's wife agreeable.  SLP facilitated the session with a basic pill sorting task targeting problem solving and selective attention with pt requiring increased time for delayed processing and overall min assist faded to supervision level cues to complete for 100% accuracy.  Additionally, pt recalled compensatory strategies for  speech intelligibility from yesterday's therapy session with min assist question cues.  Pt reports improved intelligibility with use of loud voice in conversations.  SLP also facilitated the session with a structured task targeting use of associations as a compensatory strategy for memory during which pt was 75% independent for use of strategies, improving to 100% accuracy with min assist verbal cues.  Continue per current plan of care.     FIM:  Comprehension Comprehension Mode: Auditory Comprehension: 5-Understands basic 90% of the time/requires cueing < 10% of the time Expression Expression Mode: Verbal Expression: 5-Expresses basic 90% of the time/requires cueing < 10% of the time. Social Interaction Social Interaction: 5-Interacts appropriately 90% of the time - Needs monitoring or encouragement for participation or interaction. Problem Solving Problem Solving: 4-Solves basic 75 - 89% of the time/requires cueing 10 - 24% of the time Memory Memory: 3-Recognizes or recalls 50 - 74% of the time/requires cueing 25 - 49% of the time  Pain Pain Assessment Pain Assessment: No/denies pain  Therapy/Group: Individual Therapy  Taylor Keller, M.A. CCC-SLP  Taylor Keller, Taylor Keller 10/03/2013, 3:50 PM

## 2013-10-04 ENCOUNTER — Inpatient Hospital Stay (HOSPITAL_COMMUNITY): Payer: Medicare HMO

## 2013-10-04 ENCOUNTER — Inpatient Hospital Stay (HOSPITAL_COMMUNITY): Payer: Medicare HMO | Admitting: Speech Pathology

## 2013-10-04 DIAGNOSIS — I2510 Atherosclerotic heart disease of native coronary artery without angina pectoris: Secondary | ICD-10-CM

## 2013-10-04 DIAGNOSIS — I619 Nontraumatic intracerebral hemorrhage, unspecified: Secondary | ICD-10-CM

## 2013-10-04 DIAGNOSIS — Z8674 Personal history of sudden cardiac arrest: Secondary | ICD-10-CM

## 2013-10-04 NOTE — Progress Notes (Signed)
Patient ID: Taylor Keller, male   DOB: 1935/07/10, 78 y.o.   MRN: [id number]  10/04/13.  78 y.o. right-handed male with history of CAD with cardiac arrest July 2015 and maintained on Plavix. CVA 2011 with little residual weakness. Independent prior to admission living with his wife. Presented 09/25/2013 with headache, nausea and vomiting as well as right-sided weakness. CT/MRI of the brain showed a 2.6 x 1.9 cm hematoma in the inferior right cerebellum. Neurosurgery consulted advise conservative care.  Past Medical History  Diagnosis Date  . Lumbar stenosis     L5  . Hypothyroidism   . Hyperlipidemia   . Hypertension   . ED (erectile dysfunction)   . Internal carotid artery stenosis 5/15    bilateral, 40-59%  . Myocardial infarction 08/2013  . Heart murmur   . Stroke ~ 2011    "slight; no permanent damage"     Intake/Output Summary (Last 24 hours) at 10/04/13 0856 Last data filed at 10/03/13 2100  Gross per 24 hour  Intake    120 ml  Output    200 ml  Net    -80 ml    Patient Vitals for the past 24 hrs:  BP Temp Temp src Pulse Resp SpO2  10/04/13 0622 169/69 mmHg 98 F (36.7 C) Oral 69 17 96 %  10/03/13 2125 152/77 mmHg 98.5 F (36.9 C) Oral 79 18 -  10/03/13 1412 97/84 mmHg 97.8 F (36.6 C) Oral 69 18 98 %     Subjective/Complaints:  No new c/os or concerns Review of Systems - Negative except R arm clumsy Objective: Vital Signs: Blood pressure 169/69, pulse 69, temperature 98 F (36.7 C), temperature source Oral, resp. rate 17, height 5\' 11"  (1.803 m), weight 77.792 kg (171 lb 8 oz), SpO2 96.00%. No results found. No results found for this or any previous visit (from the past 72 hour(s)).   HEENT: normal and nystagmus Cardio: RRR  Gr 2/6 SEM Resp: CTA B/L GI: BS positive and NT, ND; prominent pulsatile mass upper abdomen (h/o AAA?) Extremity:  Pulses positive and No Edema Skin:   Intact Neuro: Alert/Oriented, Cranial Nerve Abnormalities nystagmus horz,  Normal Sensory, Abnormal Motor 4/5 in BUE and BLE and Abnormal FMC Ataxic/ dec FMC Musc/Skel:  Normal Gen NAD Dysmetria and pass pointing with R FNF  Assessment/Plan: 1. Functional deficits secondary to RIght cerebellar ICH which require 3+ hours per day of interdisciplinary therapy in a comprehensive inpatient rehab setting. 2. DVT Prophylaxis/Anticoagulation: Subcutaneous heparin initiated 09/28/2013. Monitor for any bleeding episodes   3. Hypertension. Lasix 40 mg daily, Imdur 60 mg daily, Cozaar 50 mg daily, Lopressor 100 mg twice a day.   4. History of coronary artery disease with cardiac arrest July 2015. No chest pain or shortness of breath. Patient on Plavix prior to admission currently on hold secondary to [place]. Remains on aspirin 81 mg daily. Followup cardiology Dr. Meda Coffee as needed    LOS (Days) 4 A FACE TO FACE EVALUATION WAS PERFORMED  Nyoka Cowden 10/04/2013, 8:55 AM

## 2013-10-04 NOTE — Progress Notes (Signed)
Physical Therapy Session Note  Patient Details  Name: Taylor Keller MRN: [id number] Date of Birth: 04-Feb-1936  Today's Date: 10/04/2013 PT Individual Time: 1000-1050 and 2536-6440 PT Individual Time Calculation (min): 50 min and 40 min  Short Term Goals: Week 1:  PT Short Term Goal 1 (Week 1): Pt will perform dynamic sitting balance w/ min A and min cues for midline orientation.  PT Short Term Goal 2 (Week 1): Pt will perform stand pivot transfer consistently at mod A.  PT Short Term Goal 3 (Week 1): Pt will report dizziness <5/10 during visual/vestibular exercises PT Short Term Goal 4 (Week 1): Pt will ambulate x 50' w/ LRAD at mod A PT Short Term Goal 5 (Week 1): Pt will self propel w/c x 100' at S level  Skilled Therapeutic Interventions/Progress Updates:   Pt received after B&D session; pt reporting fatigue but willing to participate.  Assisted pt with donning of shoes and pants while incorporating head movements to adapt to motion sensitivity.  Cued pt to lean forwards in w/c to don L shoe and to attempt to tie shoe; required mod-max A secondary to pt drifting to L and becoming less alert during task; pt continued to report significant fatigue but able to be aroused with name calling.  Also performed sit > stand and static standing at sink with bilat > unilat UE support on sink to pull up brief and pants and use of leaning, reaching, visual feedback of mirror, verbal and tactile cues to maintain LE hip and knee extension and to maintain COG in midline; at times pt required as much as total A for standing balance secondary to posterior and R lateropulsion but would also require as little as min A when fully alert and LE activated.  Returned to w/c and vitals assessed secondary to continued episodes of decreased arousal.  Vitals WFL.  Pt agreed to performing oculomotor and vestibular exercises in the gym.  Transported to gym in w/c total A.  Performed squat pivot w/c > mat mod A over the back  technique to facilitate full anterior/lateral lean and use of head-hips relationship for transfer.  Seated on mat attempted NMR/vestibular training; see below for details.  Pt continued to have episodes of decreased arousal/alertness and very limited ability to participate in activities.  RN notified of pt changes in arousal/alertness.  RN feels pt is very fatigued with back to back therapies and agreed with therapist that pt should return to bed for rest break.  Pt transferred back to w/c with mod A and w/c > bed mod A squat pivot with min-mod A for sit >supine after pt cued to lean down to untie shoes.  Pt left with wife present.  Will f/u this afternoon.   PM session: pt asleep in bed and initially refusing stating "this bed feels so good".  Able to slowly coax patient awake and performed supine > sit EOB with mod A with verbal cues for sequencing and use of gaze stabilization during rolling to minimize dizziness.  Once upright pt more alert and responsive than am.  Pt reporting 4/10 dizziness upon sitting upright; pt cued to lean onto L elbow while seated EOB secondary to R lateral and posterior drift.  Donned shoes and performed squat pivot to w/c min A with min verbal cues to sequence.  In gym performed NMR in // bars; see below for details.  Returned to room and pt requested to eat a few bites of his banana.  Supervised pt  with eating banana and gave mod cues for frequent swallows.  Suddenly pt began to vomit.  RN notified of vomiting.  PT remained with pt until RN arrived and pt had ceased vomiting; assisted with hygiene.  Pt reporting feeling better after vomiting.  Pt did not report nausea during treatment session.  Pt left in w/c with quick release belt and all items within reach.    Therapy Documentation Precautions:  Precautions Precautions: Fall Precaution Comments: dizziness w/ all movement, nystagmus w/ R gaze, uncoordinated movements Restrictions Weight Bearing Restrictions:  No General: PT Amount of Missed Time (min): 10 Minutes PT Missed Treatment Reason: Patient fatigue Vital Signs: Therapy Vitals Pulse Rate: 74 BP: 125/64 mmHg Patient Position (if appropriate): Sitting Pain: Pain Assessment Pain Assessment: No/denies pain Other Treatments: Treatments Neuromuscular Facilitation: Activity to increase coordination;Activity to increase motor control;Activity to increase timing and sequencing;Activity to increase lateral weight shifting;Activity to increase anterior-posterior weight shifting in unsupported sitting while attempting to perform VOR x 1 horizontal x 5-10 seconds and visual scanning/saccades during card matching game on table; pt unable to attend to tasks due to decreased arousal/fatigue and required max-total A at times to maintain sitting balance/postural control.  Second session: NMR in // bars with focus on sit <> stand with sliding LUE forwards along bar to facilitate L anterior trunk rotation, lean and weight shift during sit <> stand with cues to keep chest tall; pt able to perform with mod A; upon standing performed dynamic weight shift training with focus on L lateral reaching and weight shift and maintaining RLE extension and L lateral weight shift while stepping R foot forwards and retro with cues to maintain wide BOS during stepping.  Pt required between min >> max A during activity secondary to dizziness and LOB forwards or to R.      See FIM for current functional status  Therapy/Group: Individual Therapy  Raylene Everts [facility] 10/04/2013, 10:56 AM

## 2013-10-04 NOTE — Progress Notes (Signed)
Speech Language Pathology Daily Session Note  Patient Details  Name: Taylor Keller MRN: [id number] Date of Birth: Jan 02, 1936  Today's Date: 10/04/2013 SLP Individual Time: 1245-1315 SLP Individual Time Calculation (min): 30 min  Short Term Goals: Week 1: SLP Short Term Goal 1 (Week 1): Pt will utilize an increased vocal intensity at the sentence level with supervision verbal cues.  SLP Short Term Goal 2 (Week 1): Pt will utilize swallowing compensatory strategies with current diet to minimize overt s/s of aspiration with Min A multimodal cues.  SLP Short Term Goal 3 (Week 1): Pt will demonstrate selective attention in a mildly distracting enviornment with supervision verbal cues for 60 minutes.  SLP Short Term Goal 4 (Week 1): Pt will demonstrate functional problem solving for basic and familiar tasks with Min A multimodal cues.  SLP Short Term Goal 5 (Week 1): Pt will recall new, daily information with use of external aids with supervision multimodal cues.   Skilled Therapeutic Interventions: Skilled treatment session focused on cognitive and dysphagia goals. SLP facilitated session by providing Max A multimodal cues for functional problem solving with tray set-up and self-feeding and for sustained attention to task. Patient consumed lunch meal of Dys. 3 textures with thin liquids via cup and required Max A multimodal cues for utilization of swallowing compensatory strategies. Patient demonstrated intermittent coughing and throat clearing throughout the meal, suspect potential aspirates/penetrates were expelled.  Patient's wife present and educated on appropriate cueing for utilization of compensatory strategies during meals and strategies to utilize to increase attention to self-feeding such as limiting texture and liquid options to one at a time.  Patient's wife verbalized understanding. Continue with current plan of care.    FIM:  Comprehension Comprehension Mode:  Auditory Comprehension: 3-Understands basic 50 - 74% of the time/requires cueing 25 - 50%  of the time Expression Expression Mode: Verbal Expression: 5-Expresses basic 90% of the time/requires cueing < 10% of the time. Social Interaction Social Interaction: 2-Interacts appropriately 25 - 49% of time - Needs frequent redirection. Problem Solving Problem Solving: 2-Solves basic 25 - 49% of the time - needs direction more than half the time to initiate, plan or complete simple activities Memory Memory: 2-Recognizes or recalls 25 - 49% of the time/requires cueing 51 - 75% of the time FIM - Eating Eating Activity: 4: Help with managing cup/glass;5: Needs verbal cues/supervision  Pain Pain Assessment Pain Assessment: No/denies pain  Therapy/Group: Individual Therapy  Anajulia Leyendecker 10/04/2013, 1:24 PM

## 2013-10-04 NOTE — Progress Notes (Signed)
Occupational Therapy Session Note  Patient Details  Name: Taylor Keller MRN: [id number] Date of Birth: 1935/03/26  Today's Date: 10/04/2013 OT Individual Time: 0900-1000 OT Individual Time Calculation (min): 60 min   Short Term Goals: Week 1:  OT Short Term Goal 1 (Week 1): Pt will complete bathing with min assist at sit > stand level OT Short Term Goal 2 (Week 1): Pt will complete LB dressing with mod assist  OT Short Term Goal 3 (Week 1): Pt will complete toilet transfer with min assist OT Short Term Goal 4 (Week 1): Pt will complete 2/4 grooming tasks in standing with min assist  Skilled Therapeutic Interventions/Progress Updates:  ADL-retraining with focus on improved activity tolerance, improved sit>stand, improved dynamic sitting/standing balance, and transfer training.  Pt received supine in bed with spouse present in room.   Pt was alert although with delayed response to prompts and questions.   Pt reported ongoing bouts of dizziness but did not relate symptoms to visual impairment until clarified by spouse.   With mod assist to lift both legs, pt rolled his right and sat at edge of bed unsupported for 5 minutes to adjust/accomodate to change of position prior to attempting stand and ambulation for BADL at shower level.   Pt required facilitation for placement of feet and hands during transfer to RW and moderate assist to weight-shift from left to right.   Pt attempted ambulation to shower bench but regressed quickly after 4 steps and required max assist to recover balance and return to w/c d/t increased dizzyness.   Pt stated, "I just feel tired."   Orthostatics checked: 138/ 63, 76 bpm, 96%.  After brief rest break, pt transferred from w/c to tub bench with steadying assist during transfer,  And instructional  and intermittent verbal cues to sequence and progress through bathing tasks.   Pt attempted sit>stand in shower to wash buttocks but required mod assist to maintain balance and for  improved thoroughness (pt = 70% thorough).   Pt completed similar transfer to w/c after bathing and dressed seated in w/c near edge of bed with extra time, unable to finish lower body dressing prior to physical therapy session.   Pt left with physical therapist at end of session to complete lower body dressing taks (pull up diaper and pants).         Therapy Documentation Precautions:  Precautions Precautions: Fall Precaution Comments: dizziness w/ all movement, nystagmus w/ R gaze, uncoordinated movements Restrictions Weight Bearing Restrictions: No   Vital Signs: Therapy Vitals Pulse Rate: 74 BP: 125/64 mmHg Patient Position (if appropriate): Sitting  Pain: Pain Assessment Pain Assessment: No/denies pain  ADL: ADL ADL Comments: See FIM  Other Treatments:   See FIM for current functional status  Therapy/Group: Individual Therapy  Dalonda Simoni 10/04/2013, 10:59 AM

## 2013-10-05 ENCOUNTER — Inpatient Hospital Stay (HOSPITAL_COMMUNITY): Payer: Medicare HMO

## 2013-10-05 NOTE — Progress Notes (Addendum)
Physical Therapy Session Note  Patient Details  Name: Taylor Keller MRN: [id number] Date of Birth: Aug 20, 1935  Today's Date: 10/05/2013 PT Individual Time: 0800-0900 PT Individual Time Calculation (min): 60 min   Short Term Goals: Week 1:  PT Short Term Goal 1 (Week 1): Pt will perform dynamic sitting balance w/ min A and min cues for midline orientation.  PT Short Term Goal 2 (Week 1): Pt will perform stand pivot transfer consistently at mod A.  PT Short Term Goal 3 (Week 1): Pt will report dizziness <5/10 during visual/vestibular exercises PT Short Term Goal 4 (Week 1): Pt will ambulate x 50' w/ LRAD at mod A PT Short Term Goal 5 (Week 1): Pt will self propel w/c x 100' at S level  Skilled Therapeutic Interventions/Progress Updates:    Pt received upright in bed, agreeable to participate in therapy. Session focused on vestibular habituation w/ functional mobility. Pt moved supine>sit w/ ModA, assist needed to manage BLE. Pt performed transfer w/o compensatory strategy of visual fixation during movement, reported significant increase of dizziness seated EOB (2/10 while upright in bed, 5/10 seated EOB). Pt donned shorts w/ MinA to put legs in/pull up then ModA to move sit<>stand in order to pull up shorts. After sit<>stand to pull up shorts dizziness increased to 8/10 and did not abate. Therapist assisted pt w/ moving sit>supine in order to reduce dizziness. ModA for transfer, then [place] for moving pt up in bed. Upright in bed pt reported decrease in dizziness to 4-5/10. Attempted VOR x1 exercises while pt upright in bed, pt unable to attend to task for >5 seconds. With cue of "turn head side to side" pt would turn head to one side and stop, required max VC's to continue task. Pt agreed to attempt functional mobility again. Supine>sit w/ ModA w/ visual fixation. Pt reported no increase in dizziness after transfer (maintained at 5/10). Therapist set up wheelchair for squat pivot transfer to R,  pt transferred w/ visual fixation w/ ModA. Slight increase in dizziness after transfer to w/c (increased to 6/10, after time increased to 8/10). Pt denied nausea. Pt left seated in w/c w/ wife present and QRB donned w/ all needs within reach.  Of note, pt w/ several instances during session of fatigue limiting attention/focus, required loud verbal cues to redirect and attend to task (pt did not respond to tactile cues). Pt denied feeling tired.   Therapy Documentation Precautions:  Precautions Precautions: Fall Precaution Comments: dizziness w/ all movement, nystagmus w/ R gaze, uncoordinated movements Restrictions Weight Bearing Restrictions: No Vital Signs: Therapy Vitals Temp: 98.8 F (37.1 C) Temp src: Oral Pulse Rate: 72 Resp: 18 BP: 158/60 mmHg Patient Position (if appropriate): Lying Oxygen Therapy SpO2: 98 % O2 Device: None (Room air)  See FIM for current functional status  Therapy/Group: Individual Therapy  Rada Hay Rada Hay, PT, DPT 10/05/2013, 7:59 AM

## 2013-10-05 NOTE — Progress Notes (Signed)
Patient ID: FAIZON CAPOZZI, male   DOB: January 14, 1936, 78 y.o.   MRN: [id number]  Patient ID: WHITLEY STRYCHARZ, male   DOB: 1936-02-06, 78 y.o.   MRN: [id number]  10/05/13.  78 y.o. right-handed male with history of CAD with cardiac arrest July 2015 and maintained on Plavix. CVA 2011 with little residual weakness. Independent prior to admission living with his wife.   Presented 09/25/2013 with headache, nausea and vomiting as well as right-sided weakness. CT/MRI of the brain showed a 2.6 x 1.9 cm hematoma in the inferior right cerebellum.   Neurosurgery consulted advise conservative care.  Past Medical History  Diagnosis Date  . Lumbar stenosis     L5  . Hypothyroidism   . Hyperlipidemia   . Hypertension   . ED (erectile dysfunction)   . Internal carotid artery stenosis 5/15    bilateral, 40-59%  . Myocardial infarction 08/2013  . Heart murmur   . Stroke ~ 2011    "slight; no permanent damage"     Intake/Output Summary (Last 24 hours) at 10/05/13 0841 Last data filed at 10/04/13 1800  Gross per 24 hour  Intake    240 ml  Output    150 ml  Net     90 ml    Patient Vitals for the past 24 hrs:  BP Temp Temp src Pulse Resp SpO2  10/05/13 0610 158/60 mmHg 98.8 F (37.1 C) Oral 72 18 98 %  10/04/13 2112 159/75 mmHg 98 F (36.7 C) Oral 82 17 94 %  10/04/13 1515 122/69 mmHg 98 F (36.7 C) Oral 72 18 95 %  10/04/13 1015 125/64 mmHg - - 74 - -  10/04/13 0925 138/63 mmHg - - 76 - 96 %     Subjective/Complaints:  No new c/os or concerns except constipation; daughter at bedside Review of Systems - Negative except R arm clumsy Objective: Vital Signs: Blood pressure 158/60, pulse 72, temperature 98.8 F (37.1 C), temperature source Oral, resp. rate 18, height 5\' 11"  (1.803 m), weight 77.792 kg (171 lb 8 oz), SpO2 98.00%. No results found. No results found for this or any previous visit (from the past 72 hour(s)).   HEENT: normal and nystagmus Cardio: RRR  Gr 2/6 SEM Resp:  CTA B/L GI: BS positive and NT, ND; prominent pulsatile mass upper abdomen (h/o AAA?) with bruit Extremity:  Pulses positive and No Edema Skin:   Intact Neuro: Alert/Oriented, Cranial Nerve Abnormalities nystagmus horz, Normal Sensory, Abnormal Motor 4/5 in BUE and BLE and Abnormal FMC Ataxic/ dec FMC Musc/Skel:  Normal Gen NAD Dysmetria and pass pointing with R FNF  Assessment/Plan: 1. Functional deficits secondary to RIght cerebellar ICH 2. DVT Prophylaxis/Anticoagulation: Subcutaneous heparin initiated 09/28/2013. Monitor for any bleeding episodes   3. Hypertension. Lasix 40 mg daily, Imdur 60 mg daily, Cozaar 50 mg daily, Lopressor 100 mg twice a day.   4. History of coronary artery disease with cardiac arrest July 2015. No chest pain or shortness of breath. Patient on Plavix prior to admission currently on hold secondary to [place]. Remains on aspirin 81 mg daily. Followup cardiology Dr. Meda Coffee as needed    LOS (Days) 5 A FACE TO FACE EVALUATION WAS PERFORMED  Nyoka Cowden 10/05/2013, 8:41 AM

## 2013-10-06 ENCOUNTER — Inpatient Hospital Stay (HOSPITAL_COMMUNITY): Payer: Medicare HMO

## 2013-10-06 ENCOUNTER — Inpatient Hospital Stay (HOSPITAL_COMMUNITY): Payer: Medicare HMO | Admitting: Physical Therapy

## 2013-10-06 ENCOUNTER — Inpatient Hospital Stay (HOSPITAL_COMMUNITY): Payer: Medicare HMO | Admitting: Speech Pathology

## 2013-10-06 ENCOUNTER — Encounter (HOSPITAL_COMMUNITY): Payer: Medicare HMO | Admitting: Occupational Therapy

## 2013-10-06 DIAGNOSIS — I672 Cerebral atherosclerosis: Secondary | ICD-10-CM

## 2013-10-06 DIAGNOSIS — I2510 Atherosclerotic heart disease of native coronary artery without angina pectoris: Secondary | ICD-10-CM

## 2013-10-06 DIAGNOSIS — I10 Essential (primary) hypertension: Secondary | ICD-10-CM

## 2013-10-06 DIAGNOSIS — Z5189 Encounter for other specified aftercare: Secondary | ICD-10-CM

## 2013-10-06 LAB — BASIC METABOLIC PANEL
ANION GAP: 13 (ref 5–15)
BUN: 19 mg/dL (ref 6–23)
CO2: 24 meq/L (ref 19–32)
CREATININE: 1.33 mg/dL (ref 0.50–1.35)
Calcium: 9.2 mg/dL (ref 8.4–10.5)
Chloride: 93 mEq/L — ABNORMAL LOW (ref 96–112)
GFR calc Af Amer: 58 mL/min — ABNORMAL LOW (ref >90)
GFR calc non Af Amer: 50 mL/min — ABNORMAL LOW (ref >90)
GLUCOSE: 115 mg/dL — AB (ref 70–99)
Potassium: 4.6 mEq/L (ref 3.7–5.3)
Sodium: 130 mEq/L — ABNORMAL LOW (ref 137–147)

## 2013-10-06 LAB — URINALYSIS, ROUTINE W REFLEX MICROSCOPIC
Glucose, UA: NEGATIVE mg/dL
Hgb urine dipstick: NEGATIVE
Ketones, ur: NEGATIVE mg/dL
Leukocytes, UA: NEGATIVE
Nitrite: NEGATIVE
Protein, ur: NEGATIVE mg/dL
Specific Gravity, Urine: 1.021 (ref 1.005–1.030)
Urobilinogen, UA: 1 mg/dL (ref 0.0–1.0)
pH: 6 (ref 5.0–8.0)

## 2013-10-06 LAB — CBC WITH DIFFERENTIAL/PLATELET
Basophils Absolute: 0 10*3/uL (ref 0.0–0.1)
Basophils Relative: 0 % (ref 0–1)
Eosinophils Absolute: 0 10*3/uL (ref 0.0–0.7)
Eosinophils Relative: 0 % (ref 0–5)
HCT: 37 % — ABNORMAL LOW (ref 39.0–52.0)
HEMOGLOBIN: 11.8 g/dL — AB (ref 13.0–17.0)
Lymphocytes Relative: 7 % — ABNORMAL LOW (ref 12–46)
Lymphs Abs: 0.6 10*3/uL — ABNORMAL LOW (ref 0.7–4.0)
MCH: 26.6 pg (ref 26.0–34.0)
MCHC: 31.9 g/dL (ref 30.0–36.0)
MCV: 83.5 fL (ref 78.0–100.0)
MONO ABS: 0.8 10*3/uL (ref 0.1–1.0)
MONOS PCT: 8 % (ref 3–12)
NEUTROS ABS: 8 10*3/uL — AB (ref 1.7–7.7)
Neutrophils Relative %: 85 % — ABNORMAL HIGH (ref 43–77)
Platelets: 353 10*3/uL (ref 150–400)
RBC: 4.43 MIL/uL (ref 4.22–5.81)
RDW: 17.3 % — ABNORMAL HIGH (ref 11.5–15.5)
WBC: 9.4 10*3/uL (ref 4.0–10.5)

## 2013-10-06 MED ORDER — BACLOFEN 5 MG HALF TABLET
5.0000 mg | ORAL_TABLET | Freq: Three times a day (TID) | ORAL | Status: DC
  Administered 2013-10-06: 5 mg via ORAL
  Administered 2013-10-07: 10:00:00 via ORAL
  Administered 2013-10-07: 5 mg via ORAL
  Filled 2013-10-06 (×6): qty 1

## 2013-10-06 NOTE — Progress Notes (Signed)
Physical Therapy Session Note  Patient Details  Name: Taylor Keller MRN: [id number] Date of Birth: 11/09/1935  Today's Date: 10/06/2013 PT Individual Time: 1300-1345 PT Individual Time Calculation (min): 45 min   Short Term Goals: Week 1:  PT Short Term Goal 1 (Week 1): Pt will perform dynamic sitting balance w/ min A and min cues for midline orientation.  PT Short Term Goal 2 (Week 1): Pt will perform stand pivot transfer consistently at mod A.  PT Short Term Goal 3 (Week 1): Pt will report dizziness <5/10 during visual/vestibular exercises PT Short Term Goal 4 (Week 1): Pt will ambulate x 50' w/ LRAD at mod A PT Short Term Goal 5 (Week 1): Pt will self propel w/c x 100' at S level  Skilled Therapeutic Interventions/Progress Updates:    Pt received seated in w/c accompanied by wife. Baseline subjective dizziness/disequilibrium: 7/10. Pt denies pain but reports feeling more nauseated today than in previous sessions. Noted elevated body temperature (101.1 degrees). Per wife, pt ate minimal breakfast and lunch secondary to difficulty lifting eating utensils to mouth. RN notified of change in pt presentation. Per RN, MD is aware.  Pt reporting need to urinate. Therefore,performed sit<>stand from w/c with rolling walker and max A. Pt required max-Total A (secondary to posterior preference, bilat knee buckling with increased fatigue) for static standing x1.5 minutes with bilat UE support at rolling walker; wife concurrently managed pants/brief and urinal; however, pt unable to urinate at this time. Upon sitting, pt required +2Total A, max multimodal cueing, and increased time to reposition self in chair. Upon returning to seated then utilizing visual target compensate for vestibular loss, pt reported subjective dizziness of 3/10. Per pt request to return to bed, pt performed lateral scooting transfer from w/c > bed with max A, max manual facilitation of anterior weight shift (secondary to pt  posterior pushing with trunk), 75% cueing for technique and for use of visual fixation with ineffective within-session carryover. Performed sit>supine with max A for management of bilat LE's, controlled descent of trunk. In supine, performed scooting to [facility] with +2A. Therapist departed with pt semi reclined in bed with wife present and all needs within reach. Pt in no apparent distress. Due to assist required with transfers during this session, modified safety plan to +2A with nursing. Notified PA Pam of significant functional decline since last session (8/28).  Therapy Documentation Precautions:  Precautions Precautions: Fall Precaution Comments: dizziness w/ all movement, nystagmus w/ R gaze, uncoordinated movements Restrictions Weight Bearing Restrictions: No Vital Signs: Therapy Vitals Temp: 101.1 F (38.4 C) Temp src: Oral Pain: Pain Assessment Pain Assessment: No/denies pain  See FIM for current functional status  Therapy/Group: Individual Therapy  Lynleigh Kovack, Malva Cogan 10/06/2013, 1:13 PM

## 2013-10-06 NOTE — Progress Notes (Signed)
Physical Therapy Session Note  Patient Details  Name: Taylor Keller MRN: [id number] Date of Birth: Oct 31, 1935  Today's Date: 10/06/2013 PT Individual Time: 9937-1696 PT Individual Time Calculation (min): 20 min  and  Today's Date: 10/06/2013 PT Missed Time: 10 Minutes Missed Time Reason: Patient fatigue  Short Term Goals: Week 1:  PT Short Term Goal 1 (Week 1): Pt will perform dynamic sitting balance w/ min A and min cues for midline orientation.  PT Short Term Goal 2 (Week 1): Pt will perform stand pivot transfer consistently at mod A.  PT Short Term Goal 3 (Week 1): Pt will report dizziness <5/10 during visual/vestibular exercises PT Short Term Goal 4 (Week 1): Pt will ambulate x 50' w/ LRAD at mod A PT Short Term Goal 5 (Week 1): Pt will self propel w/c x 100' at S level  Skilled Therapeutic Interventions/Progress Updates:   Pt received supine asleep in bed. Difficulty arousing pt, able to open eyes for 5-10 seconds at a time w/ max multimodal cues (responded best to sternal rub, clapping, hamstring stretch). Pt very drowsy and lethargic, decreased phonation, some language of confusion and delayed response time. Therapist attempted to increase arousal by setting bed in chair position, patient state did not change. Pt able to verbalize that he had pain on his bottom. Therapist and therapy tech rolled pt to R in order to place pillows under L hip for pressure relief. Pt left supine in bed w/ wife present w/ all needs within reach. Pt missed 10 minutes 2/2 fatigue and inability to safely participate.  Pt much more lethargic/drowsy than when this therapist treated him yesterday, wife reported that there was large change in cognition/awareness in last few days. PA Pam made aware.  Therapy Documentation Precautions:  Precautions Precautions: Fall Precaution Comments: dizziness w/ all movement, nystagmus w/ R gaze, uncoordinated movements Restrictions Weight Bearing Restrictions:  No Vital Signs: Therapy Vitals Temp: 101.1 F (38.4 C) Temp src: Oral Pain: Pain Assessment Pain Assessment: No/denies pain  See FIM for current functional status  Therapy/Group: Individual Therapy  Rada Hay Rada Hay, PT, DPT 10/06/2013, 1:52 PM

## 2013-10-06 NOTE — Progress Notes (Signed)
Speech Language Pathology Daily Session Note  Patient Details  Name: Taylor Keller MRN: [id number] Date of Birth: 08-20-1935  Today's Date: 10/06/2013 SLP Individual Time: 1402-1502 SLP Individual Time Calculation (min): 60 min  Short Term Goals: Week 1: SLP Short Term Goal 1 (Week 1): Pt will utilize an increased vocal intensity at the sentence level with supervision verbal cues.  SLP Short Term Goal 2 (Week 1): Pt will utilize swallowing compensatory strategies with current diet to minimize overt s/s of aspiration with Min A multimodal cues.  SLP Short Term Goal 3 (Week 1): Pt will demonstrate selective attention in a mildly distracting enviornment with supervision verbal cues for 60 minutes.  SLP Short Term Goal 4 (Week 1): Pt will demonstrate functional problem solving for basic and familiar tasks with Min A multimodal cues.  SLP Short Term Goal 5 (Week 1): Pt will recall new, daily information with use of external aids with supervision multimodal cues.   Skilled Therapeutic Interventions:  Pt was seen for skilled speech therapy targeting cognitive goals.  Upon arrival, pt was reclined in bed, lethargic, with worsening delays in processing speed when compared to previous therapy sessions.  Pt with low grade temp per chart review, clean chest x ray, and normal blood work up per [employer]; pending further work up to determine cause of fever.  SLP facilitated session by positioning pt to be seated upright at edge of bed to facilitate improved alertness.  Pt required overall min-mod assist verbal cues to maintain sitting balance during structured tasks.  SLP facilitated the session with a structured new learning activity targeting selective attention and functional problem solving with pt requiring overall supervision level assist and increased processing time to complete task for ~80% accuracy.  SLP also reoriented pt to schedule to improve temporal awareness related to daily events and information.   Continue per current plan of care.    FIM:  Comprehension Comprehension Mode: Auditory Comprehension: 3-Understands basic 50 - 74% of the time/requires cueing 25 - 50%  of the time Expression Expression Mode: Verbal Expression: 3-Expresses basic 50 - 74% of the time/requires cueing 25 - 50% of the time. Needs to repeat parts of sentences. Social Interaction Social Interaction: 4-Interacts appropriately 75 - 89% of the time - Needs redirection for appropriate language or to initiate interaction. Problem Solving Problem Solving: 3-Solves basic 50 - 74% of the time/requires cueing 25 - 49% of the time Memory Memory: 2-Recognizes or recalls 25 - 49% of the time/requires cueing 51 - 75% of the time FIM - Eating Eating Activity: 4: Help with managing cup/glass;5: Needs verbal cues/supervision  Pain Pain Assessment Pain Assessment: No/denies pain  Therapy/Group: Individual Therapy  Windell Moulding, M.A. CCC-SLP  Karinna Beadles, Selinda Orion 10/06/2013, 4:11 PM

## 2013-10-06 NOTE — Progress Notes (Signed)
78 y.o. right-handed male with history of CAD with cardiac arrest July 2015 and maintained on Plavix. CVA 2011 with little residual weakness. Independent prior to admission living with his wife. Presented 09/25/2013 with headache, nausea and vomiting as well as right-sided weakness. CT/MRI of the brain showed a 2.6 x 1.9 cm hematoma in the inferior right cerebellum. Neurosurgery consulted advise conservative care. Noted fall 09/27/2013 while in the hospital as patient attempted to get out of bed unassisted. A followup cranial CT scan completed showing interval decrease in size of right cerebellar hemorrhage with slightly increased ventriculomegaly  involving the lateral and third ventricles. Maintained on a dysphagia 2 nectar thick liquid diet. Subcutaneous heparin initiated for DVT prophylaxis 09/28/2013  Subjective/Complaints: Per wife pt seems more confused some shaking of Right hand Low grade temp noted Per RN slept ok last noc except up to empty bladder several times Pt without c/os no hiccups Review of Systems - Negative except tired Objective: Vital Signs: Blood pressure 171/72, pulse 86, temperature 100 F (37.8 C), temperature source Oral, resp. rate 18, height 5\' 11"  (1.803 m), weight 77.792 kg (171 lb 8 oz), SpO2 97.00%. No results found. No results found for this or any previous visit (from the past 72 hour(s)).   HEENT: normal except horz nystagmus Cardio: RRR and no murmur Resp: CTA B/L GI: BS positive and NT, ND Extremity:  Pulses positive and No Edema Skin:   Intact Neuro: Alert/Oriented to person Cranial Nerve Abnormalities nystagmus horz, Normal Sensory, Abnormal Motor 4/5 in BUE and BLE and Abnormal FMC Ataxic/ dec FMC- R>L UE pass pointed on FNF, mild tremor R hand Musc/Skel:  Normal Gen NAD  Assessment/Plan: 1. Functional deficits secondary to RIght cerebellar ICH which require 3+ hours per day of interdisciplinary therapy in a comprehensive inpatient rehab  setting. Physiatrist is providing close team supervision and 24 hour management of active medical problems listed below. Physiatrist and rehab team continue to assess barriers to discharge/monitor patient progress toward functional and medical goals. Decline in mental status, suspect recurrent UTI vs medication related (?baclofen ) Will recheck UA C and S, CXR, BMET,CBC, reduce baclofen dose FIM: FIM - Bathing Bathing Steps Patient Completed: Chest;Right Arm;Left Arm;Abdomen;Front perineal area;Buttocks;Right upper leg;Left upper leg Bathing: 4: Min-Patient completes 8-9 65f 10 parts or 75+ percent  FIM - Upper Body Dressing/Undressing Upper body dressing/undressing steps patient completed: Thread/unthread right sleeve of pullover shirt/dresss;Thread/unthread left sleeve of pullover shirt/dress;Put head through opening of pull over shirt/dress;Pull shirt over trunk Upper body dressing/undressing: 5: Set-up assist to: Obtain clothing/put away FIM - Lower Body Dressing/Undressing Lower body dressing/undressing steps patient completed: Thread/unthread left underwear leg;Pull underwear up/down;Thread/unthread right pants leg;Thread/unthread left pants leg;Fasten/unfasten pants Lower body dressing/undressing: 1: Total-Patient completed less than 25% of tasks     FIM - Radio producer Devices: Recruitment consultant Transfers: 3-To toilet/BSC: Mod A (lift or lower assist)  FIM - Control and instrumentation engineer Devices: Arm rests Bed/Chair Transfer: 3: Sit > Supine: Mod A (lifting assist/Pt. 50-74%/lift 2 legs);3: Supine > Sit: Mod A (lifting assist/Pt. 50-74%/lift 2 legs;3: Bed > Chair or W/C: Mod A (lift or lower assist)  FIM - Locomotion: Wheelchair Distance: 65 Locomotion: Wheelchair: 0: Activity did not occur FIM - Locomotion: Ambulation Locomotion: Ambulation Assistive Devices:  (L handrail) Ambulation/Gait Assistance: 3: Mod assist;1: +2  Total assist (+2 for chair follow) Locomotion: Ambulation: 0: Activity did not occur  Comprehension Comprehension Mode: Auditory Comprehension: 3-Understands basic 50 - 74% of the  time/requires cueing 25 - 50%  of the time  Expression Expression Mode: Verbal Expression: 4-Expresses basic 75 - 89% of the time/requires cueing 10 - 24% of the time. Needs helper to occlude trach/needs to repeat words.  Social Interaction Social Interaction: 3-Interacts appropriately 50 - 74% of the time - May be physically or verbally inappropriate.  Problem Solving Problem Solving: 2-Solves basic 25 - 49% of the time - needs direction more than half the time to initiate, plan or complete simple activities  Memory Memory: 2-Recognizes or recalls 25 - 49% of the time/requires cueing 51 - 75% of the time  Medical Problem List and Plan:   1. Functional deficits secondary to right cerebellar ICH.   2. DVT Prophylaxis/Anticoagulation: Subcutaneous heparin initiated 09/28/2013. Monitor for any bleeding episodes   3. Pain Management: , Hydrocodone as needed. Monitor with increased mobility   4. Dysphagia. Dysphagia 2 nectar liquids. Monitor for any signs of aspiration. Followup speech therapy   5. Neuropsych: This patient is capable of making decisions on his own behalf.   6. Skin/Wound Care: Routine skin checks   7. Hypertension. Lasix 40 mg daily, Imdur 60 mg daily, Cozaar 50 mg daily, Lopressor 100 mg twice a day.   8. History of coronary artery disease with cardiac arrest July 2015. No chest pain or shortness of breath. Patient on Plavix prior to admission currently on hold secondary to [place]. Remains on aspirin 81 mg daily. Followup cardiology Dr. Meda Coffee as needed   9. Hypothyroidism. Synthroid. Latest TSH level 4.173   10. UTI. Urine study 09/28/2013 positive nitrite many bacteria D/C Bactrim  repeat UA C and S after tx 11. Hiccups-trial baclofen, CVA related improved sill reduce dose to see if this is  contributing to lethargy LOS (Days) 6 A FACE TO FACE EVALUATION WAS PERFORMED  Kristin Lamagna E 10/06/2013, 8:44 AM

## 2013-10-06 NOTE — Progress Notes (Signed)
PT relates concerns about patient's lethargy as well as decline in activity level. Labs indicated resolution of leucocytosis and CXR showing improvement but patient with fever of 101 this afternoon. Cath for repeat ua/ucs. Check BC X 2. Will repeat CT head to rule out extension of bleed v/s edema v/s worsening of hydrocephalus.

## 2013-10-06 NOTE — Progress Notes (Signed)
Occupational Therapy Session Note  Patient Details  Name: Taylor Keller MRN: [id number] Date of Birth: 1935-02-17  Today's Date: 10/06/2013 OT Individual Time: 1740-8144 OT Individual Time Calculation (min): 45 min    Short Term Goals: Week 1:  OT Short Term Goal 1 (Week 1): Pt will complete bathing with min assist at sit > stand level OT Short Term Goal 2 (Week 1): Pt will complete LB dressing with mod assist  OT Short Term Goal 3 (Week 1): Pt will complete toilet transfer with min assist OT Short Term Goal 4 (Week 1): Pt will complete 2/4 grooming tasks in standing with min assist  Skilled Therapeutic Interventions/Progress Updates:    Engaged in ADL retraining with focus on bed mobility, sit <> stand, transfers, and initiation with self-care tasks.  Pt in bed upon arrival with eyes closed.  Required increased time to arouse, using cool wash cloth and sternal rubs.  Pt required hand over hand to initiate washing face to increase level of arousal and alertness.  Pt's wife reports pt unable to feed self this AM due to decreased grasp and mild tremorlike movements in RUE.  Pt reports "very fatigued".  Upon seated EOB pt with increased arousal, however still requiring max verbal cues for initiation and participation throughout session.  Performed squat pivot to w/c with multiple scoot/squats for safety due to decreased initiation. Total assist for bathing due to decreased attention and initiation, requiring +2 to don brief in standing as pt with increased Lt lateral lean and forward flexed trunk.  Pt requesting to return to bed at end of session.  Performed stand pivot to bed with max assist due to decreased initiation.  Pt with no c/o nausea during session until supine in bed, elevated HOB and left with basin in reach and wife present.    Encouraged sitting OOB as tolerated to increase arousal and to increase safety with self-feeding.  Discussed with wife who reports understanding.  Therapy  Documentation Precautions:  Precautions Precautions: Fall Precaution Comments: dizziness w/ all movement, nystagmus w/ R gaze, uncoordinated movements Restrictions Weight Bearing Restrictions: No General:   Vital Signs: Therapy Vitals Temp: 98.5 F (36.9 C) Temp src: Oral Pain:   Pt with c/o pain in buttocks, repositioned in bed. ADL: ADL ADL Comments: See FIM  See FIM for current functional status  Therapy/Group: Individual Therapy  Simonne Come 10/06/2013, 12:19 PM

## 2013-10-07 ENCOUNTER — Inpatient Hospital Stay (HOSPITAL_COMMUNITY): Payer: Medicare HMO | Admitting: Speech Pathology

## 2013-10-07 ENCOUNTER — Inpatient Hospital Stay (HOSPITAL_COMMUNITY): Payer: Medicare HMO | Admitting: Occupational Therapy

## 2013-10-07 ENCOUNTER — Encounter (HOSPITAL_COMMUNITY): Payer: Medicare HMO | Admitting: Occupational Therapy

## 2013-10-07 ENCOUNTER — Inpatient Hospital Stay (HOSPITAL_COMMUNITY): Payer: Medicare HMO

## 2013-10-07 ENCOUNTER — Inpatient Hospital Stay (HOSPITAL_COMMUNITY): Payer: Medicare HMO | Admitting: Physical Therapy

## 2013-10-07 ENCOUNTER — Inpatient Hospital Stay (HOSPITAL_COMMUNITY)
Admission: AD | Admit: 2013-10-07 | Discharge: 2013-10-21 | DRG: 871 | Disposition: A | Discharge status: Home Health | Payer: Medicare HMO | Source: Ambulatory Visit | Attending: Internal Medicine | Admitting: Internal Medicine

## 2013-10-07 DIAGNOSIS — I248 Other forms of acute ischemic heart disease: Secondary | ICD-10-CM | POA: Diagnosis not present

## 2013-10-07 DIAGNOSIS — R5381 Other malaise: Secondary | ICD-10-CM | POA: Diagnosis not present

## 2013-10-07 DIAGNOSIS — Z87440 Personal history of urinary (tract) infections: Secondary | ICD-10-CM

## 2013-10-07 DIAGNOSIS — Z8674 Personal history of sudden cardiac arrest: Secondary | ICD-10-CM | POA: Diagnosis not present

## 2013-10-07 DIAGNOSIS — I672 Cerebral atherosclerosis: Secondary | ICD-10-CM

## 2013-10-07 DIAGNOSIS — Z7982 Long term (current) use of aspirin: Secondary | ICD-10-CM

## 2013-10-07 DIAGNOSIS — E785 Hyperlipidemia, unspecified: Secondary | ICD-10-CM | POA: Diagnosis present

## 2013-10-07 DIAGNOSIS — J9600 Acute respiratory failure, unspecified whether with hypoxia or hypercapnia: Secondary | ICD-10-CM | POA: Diagnosis present

## 2013-10-07 DIAGNOSIS — IMO0002 Reserved for concepts with insufficient information to code with codable children: Secondary | ICD-10-CM | POA: Diagnosis not present

## 2013-10-07 DIAGNOSIS — I10 Essential (primary) hypertension: Secondary | ICD-10-CM | POA: Diagnosis present

## 2013-10-07 DIAGNOSIS — E861 Hypovolemia: Secondary | ICD-10-CM | POA: Diagnosis present

## 2013-10-07 DIAGNOSIS — I614 Nontraumatic intracerebral hemorrhage in cerebellum: Secondary | ICD-10-CM

## 2013-10-07 DIAGNOSIS — I472 Ventricular tachycardia, unspecified: Secondary | ICD-10-CM | POA: Diagnosis present

## 2013-10-07 DIAGNOSIS — I69991 Dysphagia following unspecified cerebrovascular disease: Secondary | ICD-10-CM | POA: Diagnosis not present

## 2013-10-07 DIAGNOSIS — C649 Malignant neoplasm of unspecified kidney, except renal pelvis: Secondary | ICD-10-CM | POA: Diagnosis present

## 2013-10-07 DIAGNOSIS — I509 Heart failure, unspecified: Secondary | ICD-10-CM | POA: Diagnosis present

## 2013-10-07 DIAGNOSIS — A419 Sepsis, unspecified organism: Principal | ICD-10-CM | POA: Diagnosis present

## 2013-10-07 DIAGNOSIS — Z23 Encounter for immunization: Secondary | ICD-10-CM | POA: Diagnosis not present

## 2013-10-07 DIAGNOSIS — Z66 Do not resuscitate: Secondary | ICD-10-CM | POA: Diagnosis present

## 2013-10-07 DIAGNOSIS — Z9861 Coronary angioplasty status: Secondary | ICD-10-CM | POA: Diagnosis not present

## 2013-10-07 DIAGNOSIS — R1312 Dysphagia, oropharyngeal phase: Secondary | ICD-10-CM | POA: Diagnosis present

## 2013-10-07 DIAGNOSIS — E039 Hypothyroidism, unspecified: Secondary | ICD-10-CM

## 2013-10-07 DIAGNOSIS — R4702 Dysphasia: Secondary | ICD-10-CM

## 2013-10-07 DIAGNOSIS — I2510 Atherosclerotic heart disease of native coronary artery without angina pectoris: Secondary | ICD-10-CM | POA: Diagnosis present

## 2013-10-07 DIAGNOSIS — E871 Hypo-osmolality and hyponatremia: Secondary | ICD-10-CM | POA: Diagnosis present

## 2013-10-07 DIAGNOSIS — C7970 Secondary malignant neoplasm of unspecified adrenal gland: Secondary | ICD-10-CM | POA: Diagnosis present

## 2013-10-07 DIAGNOSIS — R6520 Severe sepsis without septic shock: Secondary | ICD-10-CM

## 2013-10-07 DIAGNOSIS — J9601 Acute respiratory failure with hypoxia: Secondary | ICD-10-CM

## 2013-10-07 DIAGNOSIS — R1311 Dysphagia, oral phase: Secondary | ICD-10-CM | POA: Diagnosis present

## 2013-10-07 DIAGNOSIS — D649 Anemia, unspecified: Secondary | ICD-10-CM | POA: Diagnosis present

## 2013-10-07 DIAGNOSIS — E876 Hypokalemia: Secondary | ICD-10-CM | POA: Diagnosis not present

## 2013-10-07 DIAGNOSIS — I610 Nontraumatic intracerebral hemorrhage in hemisphere, subcortical: Secondary | ICD-10-CM

## 2013-10-07 DIAGNOSIS — I619 Nontraumatic intracerebral hemorrhage, unspecified: Secondary | ICD-10-CM

## 2013-10-07 DIAGNOSIS — G9340 Encephalopathy, unspecified: Secondary | ICD-10-CM | POA: Diagnosis present

## 2013-10-07 DIAGNOSIS — E41 Nutritional marasmus: Secondary | ICD-10-CM | POA: Diagnosis present

## 2013-10-07 DIAGNOSIS — R079 Chest pain, unspecified: Secondary | ICD-10-CM

## 2013-10-07 DIAGNOSIS — Z5189 Encounter for other specified aftercare: Secondary | ICD-10-CM

## 2013-10-07 DIAGNOSIS — Z7902 Long term (current) use of antithrombotics/antiplatelets: Secondary | ICD-10-CM

## 2013-10-07 DIAGNOSIS — R1314 Dysphagia, pharyngoesophageal phase: Secondary | ICD-10-CM | POA: Diagnosis present

## 2013-10-07 DIAGNOSIS — Z888 Allergy status to other drugs, medicaments and biological substances status: Secondary | ICD-10-CM

## 2013-10-07 DIAGNOSIS — I498 Other specified cardiac arrhythmias: Secondary | ICD-10-CM | POA: Diagnosis present

## 2013-10-07 DIAGNOSIS — I219 Acute myocardial infarction, unspecified: Secondary | ICD-10-CM | POA: Diagnosis present

## 2013-10-07 DIAGNOSIS — N179 Acute kidney failure, unspecified: Secondary | ICD-10-CM | POA: Diagnosis present

## 2013-10-07 DIAGNOSIS — G9389 Other specified disorders of brain: Secondary | ICD-10-CM | POA: Diagnosis present

## 2013-10-07 DIAGNOSIS — Z800 Family history of malignant neoplasm of digestive organs: Secondary | ICD-10-CM | POA: Diagnosis not present

## 2013-10-07 DIAGNOSIS — I5021 Acute systolic (congestive) heart failure: Secondary | ICD-10-CM

## 2013-10-07 DIAGNOSIS — R401 Stupor: Secondary | ICD-10-CM

## 2013-10-07 DIAGNOSIS — I4729 Other ventricular tachycardia: Secondary | ICD-10-CM | POA: Diagnosis present

## 2013-10-07 DIAGNOSIS — R6521 Severe sepsis with septic shock: Secondary | ICD-10-CM

## 2013-10-07 DIAGNOSIS — I2489 Other forms of acute ischemic heart disease: Secondary | ICD-10-CM | POA: Diagnosis not present

## 2013-10-07 DIAGNOSIS — I259 Chronic ischemic heart disease, unspecified: Secondary | ICD-10-CM

## 2013-10-07 DIAGNOSIS — I618 Other nontraumatic intracerebral hemorrhage: Secondary | ICD-10-CM

## 2013-10-07 DIAGNOSIS — N2889 Other specified disorders of kidney and ureter: Secondary | ICD-10-CM

## 2013-10-07 DIAGNOSIS — R509 Fever, unspecified: Secondary | ICD-10-CM | POA: Diagnosis present

## 2013-10-07 DIAGNOSIS — Z515 Encounter for palliative care: Secondary | ICD-10-CM

## 2013-10-07 LAB — COMPREHENSIVE METABOLIC PANEL
ALK PHOS: 60 U/L (ref 39–117)
ALT: 20 U/L (ref 0–53)
ALT: 19 U/L (ref 0–53)
AST: 21 U/L (ref 0–37)
AST: 24 U/L (ref 0–37)
Albumin: 2.6 g/dL — ABNORMAL LOW (ref 3.5–5.2)
Albumin: 2.6 g/dL — ABNORMAL LOW (ref 3.5–5.2)
Alkaline Phosphatase: 59 U/L (ref 39–117)
Anion gap: 17 — ABNORMAL HIGH (ref 5–15)
Anion gap: 19 — ABNORMAL HIGH (ref 5–15)
BUN: 34 mg/dL — AB (ref 6–23)
BUN: 34 mg/dL — ABNORMAL HIGH (ref 6–23)
CO2: 20 meq/L (ref 19–32)
CO2: 16 mEq/L — ABNORMAL LOW (ref 19–32)
CREATININE: 2.26 mg/dL — AB (ref 0.50–1.35)
Calcium: 9.2 mg/dL (ref 8.4–10.5)
Calcium: 8.9 mg/dL (ref 8.4–10.5)
Chloride: 97 mEq/L (ref 96–112)
Chloride: 96 mEq/L (ref 96–112)
Creatinine, Ser: 2.15 mg/dL — ABNORMAL HIGH (ref 0.50–1.35)
GFR calc Af Amer: 31 mL/min — ABNORMAL LOW (ref >90)
GFR calc Af Amer: 33 mL/min — ABNORMAL LOW (ref >90)
GFR calc non Af Amer: 28 mL/min — ABNORMAL LOW (ref >90)
GFR, EST NON AFRICAN AMERICAN: 26 mL/min — AB (ref >90)
Glucose, Bld: 164 mg/dL — ABNORMAL HIGH (ref 70–99)
Glucose, Bld: 206 mg/dL — ABNORMAL HIGH (ref 70–99)
Potassium: 4.4 mEq/L (ref 3.7–5.3)
Potassium: 4.4 mEq/L (ref 3.7–5.3)
SODIUM: 131 meq/L — AB (ref 137–147)
Sodium: 134 mEq/L — ABNORMAL LOW (ref 137–147)
TOTAL PROTEIN: 7.6 g/dL (ref 6.0–8.3)
Total Bilirubin: 0.4 mg/dL (ref 0.3–1.2)
Total Bilirubin: 0.5 mg/dL (ref 0.3–1.2)
Total Protein: 7.7 g/dL (ref 6.0–8.3)

## 2013-10-07 LAB — URINE CULTURE
Colony Count: NO GROWTH
Culture: NO GROWTH

## 2013-10-07 LAB — CBC
HEMATOCRIT: 33.7 % — AB (ref 39.0–52.0)
Hemoglobin: 10.9 g/dL — ABNORMAL LOW (ref 13.0–17.0)
MCH: 26.8 pg (ref 26.0–34.0)
MCHC: 32.3 g/dL (ref 30.0–36.0)
MCV: 83 fL (ref 78.0–100.0)
Platelets: 362 10*3/uL (ref 150–400)
RBC: 4.06 MIL/uL — AB (ref 4.22–5.81)
RDW: 17.5 % — ABNORMAL HIGH (ref 11.5–15.5)
WBC: 19.1 10*3/uL — ABNORMAL HIGH (ref 4.0–10.5)

## 2013-10-07 LAB — AMYLASE: Amylase: 60 U/L (ref 0–105)

## 2013-10-07 LAB — POCT I-STAT 3, ART BLOOD GAS (G3+)
Acid-Base Excess: 1 mmol/L (ref 0.0–2.0)
Bicarbonate: 25.3 mEq/L — ABNORMAL HIGH (ref 20.0–24.0)
O2 Saturation: 100 %
PCO2 ART: 39.7 mmHg (ref 35.0–45.0)
PH ART: 7.412 (ref 7.350–7.450)
Patient temperature: 98.6
TCO2: 26 mmol/L (ref 0–100)
pO2, Arterial: 521 mmHg — ABNORMAL HIGH (ref 80.0–100.0)

## 2013-10-07 LAB — CBC WITH DIFFERENTIAL/PLATELET
BASOS ABS: 0 10*3/uL (ref 0.0–0.1)
Basophils Relative: 0 % (ref 0–1)
Eosinophils Absolute: 0 10*3/uL (ref 0.0–0.7)
Eosinophils Relative: 0 % (ref 0–5)
HCT: 34.6 % — ABNORMAL LOW (ref 39.0–52.0)
Hemoglobin: 11 g/dL — ABNORMAL LOW (ref 13.0–17.0)
LYMPHS PCT: 4 % — AB (ref 12–46)
Lymphs Abs: 0.8 10*3/uL (ref 0.7–4.0)
MCH: 27.2 pg (ref 26.0–34.0)
MCHC: 31.8 g/dL (ref 30.0–36.0)
MCV: 85.6 fL (ref 78.0–100.0)
MONOS PCT: 4 % (ref 3–12)
Monocytes Absolute: 0.8 10*3/uL (ref 0.1–1.0)
NEUTROS PCT: 92 % — AB (ref 43–77)
Neutro Abs: 18.9 10*3/uL — ABNORMAL HIGH (ref 1.7–7.7)
PLATELETS: 394 10*3/uL (ref 150–400)
RBC: 4.04 MIL/uL — ABNORMAL LOW (ref 4.22–5.81)
RDW: 17.5 % — AB (ref 11.5–15.5)
WBC: 20.5 10*3/uL — ABNORMAL HIGH (ref 4.0–10.5)

## 2013-10-07 LAB — ALKALINE PHOSPHATASE: Alkaline Phosphatase: 60 U/L (ref 39–117)

## 2013-10-07 LAB — TSH: TSH: 5.24 u[IU]/mL — ABNORMAL HIGH (ref 0.350–4.500)

## 2013-10-07 LAB — FIBRINOGEN: Fibrinogen: 730 mg/dL — ABNORMAL HIGH (ref 204–475)

## 2013-10-07 LAB — PROTIME-INR
INR: 1.62 — ABNORMAL HIGH (ref 0.00–1.49)
Prothrombin Time: 19.2 seconds — ABNORMAL HIGH (ref 11.6–15.2)

## 2013-10-07 LAB — GLUCOSE, CAPILLARY: Glucose-Capillary: 223 mg/dL — ABNORMAL HIGH (ref 70–99)

## 2013-10-07 LAB — TYPE AND SCREEN
ABO/RH(D): B POS
Antibody Screen: NEGATIVE

## 2013-10-07 LAB — ABO/RH: ABO/RH(D): B POS

## 2013-10-07 LAB — APTT: APTT: 37 s (ref 24–37)

## 2013-10-07 LAB — PROCALCITONIN: Procalcitonin: 8.86 ng/mL

## 2013-10-07 LAB — LIPASE, BLOOD: Lipase: 36 U/L (ref 11–59)

## 2013-10-07 LAB — LACTIC ACID, PLASMA: Lactic Acid, Venous: 1.7 mmol/L (ref 0.5–2.2)

## 2013-10-07 LAB — TROPONIN I: TROPONIN I: 0.85 ng/mL — AB (ref <0.30)

## 2013-10-07 LAB — LACTATE DEHYDROGENASE: LDH: 255 U/L — ABNORMAL HIGH (ref 94–250)

## 2013-10-07 MED ORDER — SODIUM CHLORIDE 0.9 % IV SOLN: 250.0000 mL | INTRAVENOUS | Status: DC | PRN

## 2013-10-07 MED ORDER — LEVOTHYROXINE SODIUM 175 MCG PO TABS
175.0000 ug | ORAL_TABLET | Freq: Every day | ORAL | Status: DC
  Filled 2013-10-07: qty 1

## 2013-10-07 MED ORDER — MIDAZOLAM HCL 2 MG/2ML IJ SOLN
2.0000 mg | Freq: Once | INTRAMUSCULAR | Status: AC
  Administered 2013-10-07: 2 mg via INTRAVENOUS

## 2013-10-07 MED ORDER — ASPIRIN 81 MG PO CHEW: 81.0000 mg | CHEWABLE_TABLET | Freq: Every day | ORAL | Status: DC

## 2013-10-07 MED ORDER — PANTOPRAZOLE SODIUM 40 MG IV SOLR: 40.0000 mg | Freq: Every day | INTRAVENOUS | Status: DC

## 2013-10-07 MED ORDER — DEXTROSE 5 % IV SOLN
2.0000 ug/min | INTRAVENOUS | Status: DC
  Administered 2013-10-07: 15 ug/min via INTRAVENOUS
  Administered 2013-10-07: 25 ug/min via INTRAVENOUS
  Administered 2013-10-08: 15 ug/min via INTRAVENOUS
  Administered 2013-10-08: 5 ug/min via INTRAVENOUS
  Filled 2013-10-07 (×5): qty 4

## 2013-10-07 MED ORDER — SODIUM CHLORIDE 0.9 % IV BOLUS (SEPSIS)
500.0000 mL | Freq: Once | INTRAVENOUS | Status: DC
  Administered 2013-10-07: 500 mL via INTRAVENOUS

## 2013-10-07 MED ORDER — VANCOMYCIN HCL IN DEXTROSE 750-5 MG/150ML-% IV SOLN
750.0000 mg | Freq: Two times a day (BID) | INTRAVENOUS | Status: DC
  Filled 2013-10-07: qty 150

## 2013-10-07 MED ORDER — FENTANYL CITRATE 0.05 MG/ML IJ SOLN: 50.0000 ug | INTRAMUSCULAR | Status: DC | PRN

## 2013-10-07 MED ORDER — DOPAMINE-DEXTROSE 3.2-5 MG/ML-% IV SOLN
5.0000 ug/kg/min | INTRAVENOUS | Status: DC
  Administered 2013-10-07: 5 ug/kg/min via INTRAVENOUS

## 2013-10-07 MED ORDER — FENTANYL CITRATE 0.05 MG/ML IJ SOLN
100.0000 ug | Freq: Once | INTRAMUSCULAR | Status: AC
  Administered 2013-10-07: 100 ug via INTRAVENOUS

## 2013-10-07 MED ORDER — BOOST / RESOURCE BREEZE PO LIQD: 1.0000 | Freq: Two times a day (BID) | ORAL | Status: DC

## 2013-10-07 MED ORDER — SODIUM CHLORIDE 0.9 % IV SOLN
25.0000 ug/h | INTRAVENOUS | Status: DC
  Administered 2013-10-07: 25 ug/h via INTRAVENOUS
  Filled 2013-10-07 (×2): qty 50

## 2013-10-07 MED ORDER — MIDAZOLAM HCL 2 MG/2ML IJ SOLN
INTRAMUSCULAR | Status: AC
  Filled 2013-10-07: qty 4

## 2013-10-07 MED ORDER — DOPAMINE-DEXTROSE 3.2-5 MG/ML-% IV SOLN
INTRAVENOUS | Status: AC
  Filled 2013-10-07: qty 250

## 2013-10-07 MED ORDER — PIPERACILLIN-TAZOBACTAM 3.375 G IVPB
3.3750 g | Freq: Three times a day (TID) | INTRAVENOUS | Status: DC
  Administered 2013-10-07 – 2013-10-08 (×2): 3.375 g via INTRAVENOUS
  Filled 2013-10-07 (×4): qty 50

## 2013-10-07 MED ORDER — PIPERACILLIN-TAZOBACTAM 3.375 G IVPB: 3.3750 g | Freq: Three times a day (TID) | INTRAVENOUS | Status: DC

## 2013-10-07 MED ORDER — HEPARIN SODIUM (PORCINE) 5000 UNIT/ML IJ SOLN
5000.0000 [IU] | Freq: Three times a day (TID) | INTRAMUSCULAR | Status: DC
  Administered 2013-10-07 – 2013-10-21 (×38): 5000 [IU] via SUBCUTANEOUS
  Filled 2013-10-07 (×48): qty 1

## 2013-10-07 MED ORDER — METOPROLOL TARTRATE 100 MG PO TABS
100.0000 mg | ORAL_TABLET | Freq: Two times a day (BID) | ORAL | Status: DC
  Filled 2013-10-07 (×2): qty 1

## 2013-10-07 MED ORDER — LOSARTAN POTASSIUM 50 MG PO TABS
50.0000 mg | ORAL_TABLET | Freq: Every day | ORAL | Status: DC
  Filled 2013-10-07: qty 1

## 2013-10-07 MED ORDER — MIDAZOLAM HCL 2 MG/2ML IJ SOLN
1.0000 mg | INTRAMUSCULAR | Status: DC | PRN
  Administered 2013-10-07: 2 mg via INTRAVENOUS
  Administered 2013-10-07: 1 mg via INTRAVENOUS
  Filled 2013-10-07 (×2): qty 2

## 2013-10-07 MED ORDER — FENTANYL CITRATE 0.05 MG/ML IJ SOLN
INTRAMUSCULAR | Status: AC
  Filled 2013-10-07: qty 4

## 2013-10-07 MED ORDER — CHLORHEXIDINE GLUCONATE 0.12 % MT SOLN
15.0000 mL | Freq: Two times a day (BID) | OROMUCOSAL | Status: DC
  Administered 2013-10-08 – 2013-10-19 (×23): 15 mL via OROMUCOSAL
  Filled 2013-10-07 (×30): qty 15

## 2013-10-07 MED ORDER — VANCOMYCIN HCL IN DEXTROSE 750-5 MG/150ML-% IV SOLN
750.0000 mg | Freq: Two times a day (BID) | INTRAVENOUS | Status: DC
  Administered 2013-10-08 – 2013-10-09 (×3): 750 mg via INTRAVENOUS
  Filled 2013-10-07 (×4): qty 150

## 2013-10-07 MED ORDER — VANCOMYCIN HCL IN DEXTROSE 1-5 GM/200ML-% IV SOLN
1000.0000 mg | Freq: Once | INTRAVENOUS | Status: AC
  Administered 2013-10-07: 1000 mg via INTRAVENOUS
  Filled 2013-10-07: qty 200

## 2013-10-07 MED ORDER — ACETAMINOPHEN 650 MG RE SUPP: 650.0000 mg | Freq: Four times a day (QID) | RECTAL | Status: DC | PRN

## 2013-10-07 MED ORDER — VANCOMYCIN HCL IN DEXTROSE 1-5 GM/200ML-% IV SOLN
1000.0000 mg | Freq: Once | INTRAVENOUS | Status: DC
  Filled 2013-10-07: qty 200

## 2013-10-07 MED ORDER — ATORVASTATIN CALCIUM 80 MG PO TABS
80.0000 mg | ORAL_TABLET | Freq: Every evening | ORAL | Status: DC
  Administered 2013-10-08 – 2013-10-10 (×3): 80 mg via ORAL
  Filled 2013-10-07 (×5): qty 1

## 2013-10-07 MED ORDER — LEVOTHYROXINE SODIUM 175 MCG PO TABS
175.0000 ug | ORAL_TABLET | Freq: Every day | ORAL | Status: DC
  Administered 2013-10-08 – 2013-10-11 (×4): 175 ug via ORAL
  Filled 2013-10-07 (×5): qty 1

## 2013-10-07 MED ORDER — ISOSORBIDE MONONITRATE ER 60 MG PO TB24
60.0000 mg | ORAL_TABLET | Freq: Every day | ORAL | Status: DC
  Filled 2013-10-07: qty 1

## 2013-10-07 MED ORDER — PANTOPRAZOLE SODIUM 40 MG IV SOLR
40.0000 mg | INTRAVENOUS | Status: DC
  Administered 2013-10-07 – 2013-10-19 (×13): 40 mg via INTRAVENOUS
  Filled 2013-10-07 (×15): qty 40

## 2013-10-07 MED ORDER — SODIUM CHLORIDE 0.9 % IV BOLUS (SEPSIS): 1000.0000 mL | INTRAVENOUS | Status: DC | PRN

## 2013-10-07 MED ORDER — NOREPINEPHRINE BITARTRATE 1 MG/ML IV SOLN: 12.0000 ug/min | INTRAVENOUS | Status: DC

## 2013-10-07 MED ORDER — INSULIN ASPART 100 UNIT/ML ~~LOC~~ SOLN
2.0000 [IU] | SUBCUTANEOUS | Status: DC
  Administered 2013-10-07: 4 [IU] via SUBCUTANEOUS
  Administered 2013-10-08: 2 [IU] via SUBCUTANEOUS
  Administered 2013-10-08: 6 [IU] via SUBCUTANEOUS
  Administered 2013-10-08 – 2013-10-09 (×3): 2 [IU] via SUBCUTANEOUS

## 2013-10-07 MED ORDER — ETOMIDATE 2 MG/ML IV SOLN
20.0000 mg | Freq: Once | INTRAVENOUS | Status: AC
  Administered 2013-10-07: 20 mg via INTRAVENOUS

## 2013-10-07 MED ORDER — SODIUM CHLORIDE 0.9 % IV SOLN
INTRAVENOUS | Status: DC
  Administered 2013-10-07 – 2013-10-14 (×5): via INTRAVENOUS

## 2013-10-07 MED ORDER — IPRATROPIUM BROMIDE 0.02 % IN SOLN: 0.5000 mg | Freq: Four times a day (QID) | RESPIRATORY_TRACT | Status: DC | PRN

## 2013-10-07 MED ORDER — PIPERACILLIN-TAZOBACTAM 3.375 G IVPB 30 MIN
3.3750 g | Freq: Once | INTRAVENOUS | Status: DC
  Filled 2013-10-07: qty 50

## 2013-10-07 MED ORDER — FUROSEMIDE 40 MG PO TABS
40.0000 mg | ORAL_TABLET | Freq: Every day | ORAL | Status: DC
  Filled 2013-10-07: qty 1

## 2013-10-07 MED ORDER — DOPAMINE-DEXTROSE 3.2-5 MG/ML-% IV SOLN: 5.0000 ug/kg/min | INTRAVENOUS | Status: DC

## 2013-10-07 MED ORDER — CETYLPYRIDINIUM CHLORIDE 0.05 % MT LIQD
7.0000 mL | Freq: Four times a day (QID) | OROMUCOSAL | Status: DC
  Administered 2013-10-08 – 2013-10-19 (×39): 7 mL via OROMUCOSAL

## 2013-10-07 MED ORDER — FENTANYL CITRATE 0.05 MG/ML IJ SOLN
100.0000 ug | Freq: Once | INTRAMUSCULAR | Status: AC
  Administered 2013-10-07: 100 ug via INTRAVENOUS
  Filled 2013-10-07: qty 2

## 2013-10-07 NOTE — Progress Notes (Signed)
78 y.o. right-handed male with history of CAD with cardiac arrest July 2015 and maintained on Plavix. CVA 2011 with little residual weakness. Independent prior to admission living with his wife. Presented 09/25/2013 with headache, nausea and vomiting as well as right-sided weakness. CT/MRI of the brain showed a 2.6 x 1.9 cm hematoma in the inferior right cerebellum. Neurosurgery consulted advise conservative care. Noted fall 09/27/2013 while in the hospital as patient attempted to get out of bed unassisted. A followup cranial CT scan completed showing interval decrease in size of right cerebellar hemorrhage with slightly increased ventriculomegaly  involving the lateral and third ventricles. Maintained on a dysphagia 2 nectar thick liquid diet. Subcutaneous heparin initiated for DVT prophylaxis 09/28/2013  Subjective/Complaints: Temp down this am Pt feels better ate a little Denies any c/o symptoms Review of Systems - Negative except tired Objective: Vital Signs: Blood pressure 148/80, pulse 81, temperature 98 F (36.7 C), temperature source Oral, resp. rate 18, height 5' 11"  (1.803 m), weight 77.792 kg (171 lb 8 oz), SpO2 95.00%. Dg Chest 2 View  10/06/2013   CLINICAL DATA:  Cough  EXAM: CHEST  2 VIEW  COMPARISON:  09/08/2013  FINDINGS: Cardiac shadow is stable. Previously seen basilar changes have resolved in the interval. No the significant residual infiltrate or effusion is noted. Degenerative changes of the thoracic spine are again seen.  IMPRESSION: Interval clearing of bibasilar changes.   Electronically Signed   By: Inez Catalina M.D.   On: 10/06/2013 09:48   Ct Head Wo Contrast  10/06/2013   CLINICAL DATA:  Followup bleed.  Lethargy.  EXAM: CT HEAD WITHOUT CONTRAST  TECHNIQUE: Contiguous axial images were obtained from the base of the skull through the vertex without intravenous contrast.  COMPARISON:  09/28/2013.  FINDINGS: The right cerebellar hemispheric infarct measures slightly smaller,  now 3.5 cm x 2.4 cm transversely, previously 3.7 cm x 2.8 cm. Surrounding vasogenic edema and mass effect are similar with a similar degree of shift of the posterior fossa midline to the left. Fourth ventricular hemorrhage noted on the prior study is not evident currently. There is a small amount of dependent hemorrhage in the occipital horn of the left lateral ventricle, with the lateral ventricular hemorrhage also decreased. Compression of the lower fourth ventricle is similar the prior study. The lateral ventricles are normal in configuration. There is ventricular and sulcal enlargement reflecting atrophy. No evidence of hydrocephalus.  There is no new intracranial hemorrhage. There is no evidence of an ischemic infarct.  Visualized sinuses and mastoid air cells are clear.  IMPRESSION: 1. No new intracranial hemorrhage. No evidence of an ischemic infarct. No evidence of hydrocephalus. 2. There has been evolution of the right cerebellar infarct, which measures smaller and is less dense, although the overall mass-effect is similar. There is significantly less intraventricular hemorrhage.   Electronically Signed   By: Lajean Manes M.D.   On: 10/06/2013 21:14   Results for orders placed during the hospital encounter of 09/30/13 (from the past 72 hour(s))  BASIC METABOLIC PANEL     Status: Abnormal   Collection Time    10/06/13  9:54 AM      Result Value Ref Range   Sodium 130 (*) 137 - 147 mEq/L   Potassium 4.6  3.7 - 5.3 mEq/L   Chloride 93 (*) 96 - 112 mEq/L   CO2 24  19 - 32 mEq/L   Glucose, Bld 115 (*) 70 - 99 mg/dL   BUN 19  6 -  23 mg/dL   Creatinine, Ser 1.33  0.50 - 1.35 mg/dL   Calcium 9.2  8.4 - 10.5 mg/dL   GFR calc non Af Amer 50 (*) >90 mL/min   GFR calc Af Amer 58 (*) >90 mL/min   Comment: (NOTE)     The eGFR has been calculated using the CKD EPI equation.     This calculation has not been validated in all clinical situations.     eGFR's persistently <90 mL/min signify possible  Chronic Kidney     Disease.   Anion gap 13  5 - 15  CBC WITH DIFFERENTIAL     Status: Abnormal   Collection Time    10/06/13  9:54 AM      Result Value Ref Range   WBC 9.4  4.0 - 10.5 K/uL   RBC 4.43  4.22 - 5.81 MIL/uL   Hemoglobin 11.8 (*) 13.0 - 17.0 g/dL   HCT 37.0 (*) 39.0 - 52.0 %   MCV 83.5  78.0 - 100.0 fL   MCH 26.6  26.0 - 34.0 pg   MCHC 31.9  30.0 - 36.0 g/dL   RDW 17.3 (*) 11.5 - 15.5 %   Platelets 353  150 - 400 K/uL   Neutrophils Relative % 85 (*) 43 - 77 %   Neutro Abs 8.0 (*) 1.7 - 7.7 K/uL   Lymphocytes Relative 7 (*) 12 - 46 %   Lymphs Abs 0.6 (*) 0.7 - 4.0 K/uL   Monocytes Relative 8  3 - 12 %   Monocytes Absolute 0.8  0.1 - 1.0 K/uL   Eosinophils Relative 0  0 - 5 %   Eosinophils Absolute 0.0  0.0 - 0.7 K/uL   Basophils Relative 0  0 - 1 %   Basophils Absolute 0.0  0.0 - 0.1 K/uL  URINALYSIS, ROUTINE W REFLEX MICROSCOPIC     Status: Abnormal   Collection Time    10/06/13  5:21 PM      Result Value Ref Range   Color, Urine AMBER (*) YELLOW   Comment: BIOCHEMICALS MAY BE AFFECTED BY COLOR   APPearance CLEAR  CLEAR   Specific Gravity, Urine 1.021  1.005 - 1.030   pH 6.0  5.0 - 8.0   Glucose, UA NEGATIVE  NEGATIVE mg/dL   Hgb urine dipstick NEGATIVE  NEGATIVE   Bilirubin Urine SMALL (*) NEGATIVE   Ketones, ur NEGATIVE  NEGATIVE mg/dL   Protein, ur NEGATIVE  NEGATIVE mg/dL   Urobilinogen, UA 1.0  0.0 - 1.0 mg/dL   Nitrite NEGATIVE  NEGATIVE   Leukocytes, UA NEGATIVE  NEGATIVE   Comment: MICROSCOPIC NOT DONE ON URINES WITH NEGATIVE PROTEIN, BLOOD, LEUKOCYTES, NITRITE, OR GLUCOSE <1000 mg/dL.     HEENT: normal except horz nystagmus Cardio: RRR and no murmur Resp: CTA B/L GI: BS positive and NT, ND Extremity:  Pulses positive and No Edema, neg calf tenderness Skin:   Intact Neuro: Alert/Oriented to person Cranial Nerve Abnormalities nystagmus horz, Normal Sensory, Abnormal Motor 4/5 in BUE and BLE and Abnormal FMC Ataxic/ dec FMC- R>L UE pass pointed on  FNF, mild tremor R hand Musc/Skel:  Normal Gen NAD  Assessment/Plan: 1. Functional deficits secondary to RIght cerebellar ICH which require 3+ hours per day of interdisciplinary therapy in a comprehensive inpatient rehab setting. Physiatrist is providing close team supervision and 24 hour management of active medical problems listed below. Physiatrist and rehab team continue to assess barriers to discharge/monitor patient progress toward functional and medical goals.  Decline in mental status, suspect recurrent UTI vs medication related (?baclofen ) Will recheck UA C and S, CXR, BMET,CBC, reduce baclofen dose FIM: FIM - Bathing Bathing Steps Patient Completed: Chest;Right Arm;Left Arm;Abdomen;Front perineal area;Buttocks;Right upper leg;Left upper leg Bathing: 1: Total-Patient completes 0-2 of 10 parts or less than 25%  FIM - Upper Body Dressing/Undressing Upper body dressing/undressing steps patient completed: Thread/unthread right sleeve of pullover shirt/dresss;Thread/unthread left sleeve of pullover shirt/dress;Put head through opening of pull over shirt/dress;Pull shirt over trunk Upper body dressing/undressing: 5: Set-up assist to: Obtain clothing/put away FIM - Lower Body Dressing/Undressing Lower body dressing/undressing steps patient completed: Thread/unthread left underwear leg;Pull underwear up/down;Thread/unthread right pants leg;Thread/unthread left pants leg;Fasten/unfasten pants Lower body dressing/undressing: 1: Total-Patient completed less than 25% of tasks  FIM - Toileting Toileting: 0: No continent bowel/bladder events this shift  FIM - Radio producer Devices: Recruitment consultant Transfers: 3-To toilet/BSC: Mod A (lift or lower assist)  FIM - Control and instrumentation engineer Devices: Arm rests;Walker Bed/Chair Transfer: 2: Sit > Supine: Max A (lifting assist/Pt. 25-49%);2: Chair or W/C > Bed: Max A (lift and lower  assist)  FIM - Locomotion: Wheelchair Distance: 65 Locomotion: Wheelchair: 0: Activity did not occur FIM - Locomotion: Ambulation Locomotion: Ambulation Assistive Devices:  (L handrail) Ambulation/Gait Assistance: 3: Mod assist;1: +2 Total assist (+2 for chair follow) Locomotion: Ambulation: 0: Activity did not occur  Comprehension Comprehension Mode: Auditory Comprehension: 3-Understands basic 50 - 74% of the time/requires cueing 25 - 50%  of the time  Expression Expression Mode: Verbal Expression: 2-Expresses basic 25 - 49% of the time/requires cueing 50 - 75% of the time. Uses single words/gestures.  Social Interaction Social Interaction: 2-Interacts appropriately 25 - 49% of time - Needs frequent redirection.  Problem Solving Problem Solving: 3-Solves basic 50 - 74% of the time/requires cueing 25 - 49% of the time  Memory Memory: 2-Recognizes or recalls 25 - 49% of the time/requires cueing 51 - 75% of the time  Medical Problem List and Plan:   1. Functional deficits secondary to right cerebellar ICH.   2. DVT Prophylaxis/Anticoagulation: Subcutaneous heparin initiated 09/28/2013. Monitor for any bleeding episodes   3. Pain Management: , Hydrocodone as needed. Monitor with increased mobility   4. Dysphagia. Dysphagia 2 nectar liquids. Monitor for any signs of aspiration. Followup speech therapy   5. Neuropsych: This patient is capable of making decisions on his own behalf.   6. Skin/Wound Care: Routine skin checks   7. Hypertension. Lasix 40 mg daily, Imdur 60 mg daily, Cozaar 50 mg daily, Lopressor 100 mg twice a day.   8. History of coronary artery disease with cardiac arrest July 2015. No chest pain or shortness of breath. Patient on Plavix prior to admission currently on hold secondary to [place]. Remains on aspirin 81 mg daily. Followup cardiology Dr. Meda Coffee as needed   9. Hypothyroidism. Synthroid. Latest TSH level 4.173   10. UTI. Urine study 09/28/2013 positive nitrite  many bacteria D/C Bactrim  repeat UA C and S after tx 11.  Low grade temp no obvious etiology, ? Viral syndrome, monitor and await BC LOS (Days) 7 A FACE TO FACE EVALUATION WAS PERFORMED  Raphaella Larkin E 10/07/2013, 8:54 AM

## 2013-10-07 NOTE — Progress Notes (Signed)
Pt brought to unit by Rapid Response RN and Dr Titus Mould. Pt prepared for central line awaiting levo infusion from pharm. Pt with periods of apnea and decision was made by Dr Titus Mould to intubate the patient. RT at bedside, medications given for intubation, followed by central line placement and ALine placement, pt tolerated all procedures well with no complications. Pt awakened afterwards reaching for ETT, restraints placed per MD. Pt able to follow commands and move all extremities. Family brought to bedside at 19:40, questions answered and emotional support given.

## 2013-10-07 NOTE — Progress Notes (Signed)
Speech Language Pathology Daily Session Note  Patient Details  Name: Taylor Keller MRN: [id number] Date of Birth: [birth year]  Today's Date: 10/07/2013 SLP Individual Time: 1330-1400 SLP Individual Time Calculation (min): 30 min  Short Term Goals: Week 1: SLP Short Term Goal 1 (Week 1): Pt will utilize an increased vocal intensity at the sentence level with supervision verbal cues.  SLP Short Term Goal 2 (Week 1): Pt will utilize swallowing compensatory strategies with current diet to minimize overt s/s of aspiration with Min A multimodal cues.  SLP Short Term Goal 3 (Week 1): Pt will demonstrate selective attention in a mildly distracting enviornment with supervision verbal cues for 60 minutes.  SLP Short Term Goal 4 (Week 1): Pt will demonstrate functional problem solving for basic and familiar tasks with Min A multimodal cues.  SLP Short Term Goal 5 (Week 1): Pt will recall new, daily information with use of external aids with supervision multimodal cues.   Skilled Therapeutic Interventions: Skilled treatment session focused on addressing dysphagia goals. Wife reported patient has had difficult with PO and as a result, SLP facilitated session with diagnostic trials.  Patient demonstrated overt s/s of aspiration with thin liquids and nectar-thick liquids via cup.  Patient demonstrated significant oral holding, up to ~45 seconds as well as multiple swallows with all trials.  As a result, a diet downgrade to Dys 1 textures and nectar-thick liquids via teaspoon with full supervision is recommended as well as a repeat objective assessment to determine safest PO intake.     FIM:  Comprehension Comprehension Mode: Auditory Comprehension: 2-Understands basic 25 - 49% of the time/requires cueing 51 - 75% of the time Expression Expression Mode: Verbal Expression: 1-Expresses basis less than 25% of the time/requires cueing greater than 75% of the time. Social Interaction Social Interaction:  1-Interacts appropriately less than 25% of the time. May be withdrawn or combative. Problem Solving Problem Solving: 1-Solves basic less than 25% of the time - needs direction nearly all the time or does not effectively solve problems and may need a restraint for safety Memory Memory: 1-Recognizes or recalls less than 25% of the time/requires cueing greater than 75% of the time FIM - Eating Eating Activity: 4: Help with managing cup/glass;5: Needs verbal cues/supervision  Pain Pain Assessment Pain Assessment: No/denies pain  Therapy/Group: Individual Therapy  Carmelia Roller., [place] [phone]  [place] 10/07/2013, 3:06 PM

## 2013-10-07 NOTE — Progress Notes (Signed)
Physical Therapy Session Note  Patient Details  Name: Taylor Keller MRN: [id number] Date of Birth: [birth year]  Today's Date: 10/07/2013 PT Individual Time: 4010-2725 PT Individual Time Calculation (min): 62 min   Short Term Goals: Week 1:  PT Short Term Goal 1 (Week 1): Pt will perform dynamic sitting balance w/ min A and min cues for midline orientation.  PT Short Term Goal 2 (Week 1): Pt will perform stand pivot transfer consistently at mod A.  PT Short Term Goal 3 (Week 1): Pt will report dizziness <5/10 during visual/vestibular exercises PT Short Term Goal 4 (Week 1): Pt will ambulate x 50' w/ LRAD at mod A PT Short Term Goal 5 (Week 1): Pt will self propel w/c x 100' at S level  Skilled Therapeutic Interventions/Progress Updates:    Pt received semi reclined in bed with wife present; agreeable to therapy. Per wife, pt ate some breakfast this AM but is still "somewhat confused," per wife. Oriented x3 to self, place, and situation; disoriented to time. Baseline subjective rating of dizziness/disequilibrium: 0/10. Donned bilat Teds.  Performed supine>sit with HOB elevated using rails with max A for bilat LE management and to stabilize trunk when seated EOB.  Assisted pt with donning shorts and shirt (per pt request for clean shirt). During dressing, pt required mod-Total A for static/dynamic sitting balance with RUE support at bed rail secondary to significant trunk flexion and R lateropulsion (creasingly more pushing trunk to R side with tactile cueing at R). Pt consistently required cueing, reinforcement to utilize visual targeting to decrease dizziness. Midline orientation, postural stability improved with use of visual fixation to compensate for vestibular impairments. Performed lateral scooting transfer from bed>w/c with Total A, 75% cueing for initiation and sequencing. Dizziness increased to 3/10 but mitigated to 0/10 within 2 minutes with use of visual target. Transported pt in w/c to  hallway outside gym with Total A for energy conservation.See below for detailed description of NMR interventions performed in hallway.  Upon returning to room, pt with increased respiratory rate, shallow breathing. Noted increased tremor. HR 126. SpO2 initially 62%. Increased to 79% within 10 seconds with cueing for diaphragmatic breathing. Immediately notified RN and PA Taylor Keller of vitals, presentation. Both RN and PA Taylor Keller present soon thereafter. At this time, SpO2 100% and HR 129. After PA evaluated pt, recommended pt be left to rest in recliner with bilat LE's elevated. Performed squat pivot transfer from w/c>recliner with max A, 50% cueing for initiation, technique. Therapist departed with pt seated in recliner with wife and RN present, pt in no apparent distress, and all needs within reach.  Therapy Documentation Precautions:  Precautions Precautions: Fall Precaution Comments: dizziness w/ all movement, nystagmus w/ R gaze, uncoordinated movements Restrictions Weight Bearing Restrictions: No Pain: Pain Assessment Pain Score: 0-No pain Locomotion : Ambulation Ambulation/Gait Assistance: 1: +2 Total assist;1: +1 Total assist (+2 for w/c follow)  NMR:  Performed sit<>stand from w/c with max A, 75% cueing for initiation/sequencing. Pt performed static standing with RUE support at hallway hand rail x1.5 minutes with max-Total A for postural stability (postural abnormalities as described above for sitting) with mirror positioned anterior to pt for visual feedback. Frequent verbal reminders to utilize mirror, which was effective in facilitating midline posture. Pt ambulate x2 steps with R rail, Total A for stability but +2A for w'c follow. Gait trial ended due to bilat knee buckling, at which time pt was safely repositioned into seated in w/c. Pt reporting 8/10 dizziness at this time.  See FIM for current functional status  Therapy/Group: Individual Therapy  [place], Malva Cogan 10/07/2013, 12:18 PM

## 2013-10-07 NOTE — Progress Notes (Signed)
Patient has had upward trend in temp to 103 with hypotension and concerns of sepsis. BC X 2 ordered (cannot be done till later today) as well as IV vancomycin. IV bolus of 500 cc ordered. TRH consulted and recommended CCM for evaluation. Labs ordered. Dr. Titus Mould recommended transfer to [facility] for closer monitoring.

## 2013-10-07 NOTE — Progress Notes (Signed)
NUTRITION FOLLOW-UP  Pt meets criteria for SEVERE MALNUTRITION in the context of acute illness or injury as evidenced by a 2.2% weight loss in one week, energy intake </= 50% for >/= 5 days, and moderate fat and muscle mass depletion.  DOCUMENTATION CODES Per approved criteria  -Severe malnutrition in the context of acute illness or injury   INTERVENTION: Continue Ensure Complete po BID, each supplement provides 350 kcal and 13 grams of protein.  Continue Resource Breeze po BID, each supplement provides 250 kcal and 9 grams of protein.  Provide Magic cup TID with meals, each supplement provides 290 kcal and 9 grams of protein  Encourage PO intake.  NUTRITION DIAGNOSIS: Inadequate oral intake related to decreased appetite as evidenced by meal completion of 10-25%; improving  Goal: Pt to meet >/= 90% of their estimated nutrition needs; not met  Monitor:  PO intake, weight trends, labs, I/O's  78 y.o. male  Admitting Dx: Intracerebral hemorrhage   ASSESSMENT: Pt with history of CAD with cardiac arrest July 2015. CVA 2011 with little residual weakness. Independent prior to admission living with his wife. Presented 09/25/2013 with headache, nausea and vomiting as well as right-sided weakness. CT/MRI of the brain showed a hematoma in the inferior right cerebellum.   Pt was seen by a RD during his acute hospitalization stay.  8/26-Pt reports having a lack of appetite. Meal completion is 10-25%. Pt reports he has been having nausea and vomiting, which has started yesterday. Pt does report that he has been having nausea over the past week since his stroke. Pt reports prior to his stroke, he was eating fine at home with no difficulties. Pt reports he has been having weight loss due to his poor appetite. Pt's usual body weight is 185 lbs. Noted pt with a 2.2% weight loss in one week. Pt reports having received Ensure during stay, but is not drinking it as the consistency is "thick and heavy"  on the pt's stomach, which may make the nausea worse. Pt is willing to try [employer]. Will order. Pt was encouraged to eat his foods at meals and drink his oral supplements.   9/1- Pt reports his appetite has been improving. Meal completion varies from 10-85%. Pt reports he currently has no stomach pains or nausea. Pt reports he has been drinking some of his Ensure along with some of the [employer]. Pt also reports he would like to have Magic cup ordered for him. Will order Pt was encouraged to eat his food at meals and to drink his supplements.  Labs:Low sodium, chloride, and GFR.  Height: Ht Readings from Last 1 Encounters:  09/30/13 _0  (1.803 m)    Weight: Wt Readings from Last 1 Encounters:  10/01/13 171 lb 8 oz (77.792 kg)   BMI:  Body mass index is 23.93 kg/(m^2).  Estimated Nutritional Needs: Kcal: 2000-2200  Protein: 85-95 grams Fluid: 2- 2.2 L/day  Skin: Small abrasion above right eye, wound on sacrum  Diet Order: Dysphagia 3   Intake/Output Summary (Last 24 hours) at 10/07/13 1152 Last data filed at 10/07/13 0745  Gross per 24 hour  Intake    180 ml  Output    590 ml  Net   -410 ml    Last BM: 9/1  Labs:   Recent Labs Lab 10/01/13 0740 10/06/13 0954  NA 133* 130*  K 4.1 4.6  CL 94* 93*  CO2 24 24  BUN 14 19  CREATININE 1.11 1.33  CALCIUM 9.1  9.2  GLUCOSE 117* 115*    CBG (last 3)  No results found for this basename: GLUCAP,  in the last 72 hours  Scheduled Meds: . aspirin EC  81 mg Oral Daily  . atorvastatin  80 mg Oral QPM  . baclofen  5 mg Oral TID  . feeding supplement (ENSURE COMPLETE)  237 mL Oral BID BM  . feeding supplement (RESOURCE BREEZE)  1 Container Oral TID BM  . [START ON 10/08/2013] furosemide  40 mg Oral Daily  . heparin  5,000 Units Subcutaneous 3 times per day  . [START ON 10/08/2013] isosorbide mononitrate  60 mg Oral Daily  . [START ON 10/08/2013] levothyroxine  175 mcg Oral QAC breakfast  . [START ON 10/08/2013]  losartan  50 mg Oral Daily  . metoprolol  100 mg Oral BID  . ondansetron  4 mg Oral TID AC  . pantoprazole  40 mg Oral Daily  . senna-docusate  1 tablet Oral BID    Continuous Infusions:   Past Medical History  Diagnosis Date  . Lumbar stenosis     L5  . Hypothyroidism   . Hyperlipidemia   . Hypertension   . ED (erectile dysfunction)   . Internal carotid artery stenosis 5/15    bilateral, 40-59%  . Myocardial infarction 08/2013  . Heart murmur   . Stroke ~ 2011    "slight; no permanent damage"    Past Surgical History  Procedure Laterality Date  . Tonsillectomy and adenoidectomy    . Appendectomy    . Coronary angioplasty with stent placement  08/2013    "2"  . Cataract extraction, bilateral Bilateral ~ 2012    Kallie Locks, MS, Provisional LDN Pager # [phone] After hours/ weekend pager # [phone]

## 2013-10-07 NOTE — Progress Notes (Signed)
Occupational Therapy Session Note  Patient Details  Name: Taylor Keller MRN: [id number] Date of Birth: 05-Nov-1935  Today's Date: 10/07/2013 OT Individual Time: 6237-6283 and 1517-6160 OT Individual Time Calculation (min): 66 min and 26 min   Short Term Goals: Week 1:  OT Short Term Goal 1 (Week 1): Pt will complete bathing with min assist at sit > stand level OT Short Term Goal 2 (Week 1): Pt will complete LB dressing with mod assist  OT Short Term Goal 3 (Week 1): Pt will complete toilet transfer with min assist OT Short Term Goal 4 (Week 1): Pt will complete 2/4 grooming tasks in standing with min assist  Skilled Therapeutic Interventions/Progress Updates:    1) Engaged in ADL retraining with focus on sitting balance, sit > stand, initiation, and increased participation in self-care tasks.  Pt more alert upon arrival, but continues to have decreased arousal, initiation, and difficulty following one step directions.  Max-total manual facilitation to promote midline sitting balance (as pt leaning Rt in chair) to allow increased participation in self-care tasks.  Provided visual target for pt to reach for to promote anterior weight shift in preparation for sit > stand for LB self-care, with pt reaching for therapist's pants pocket.  Hand over hand to initiate grooming task of washing face with pt able to complete task with increased time and physical assist to initiate, also required assist to Rt elbow to bring hand to face.  Pt unable to initiate or problem solve brushing hair, initially holding brush in Rt hand and transferring to Lt still with inability to complete task despite increased time and physical assist.  Pt reports needing to urinate, setup with urinal with no success however noted wet brief.  Stood to remove soiled brief and don clean one.  Pt required total assist sit > stand x5-6 and +2 to don new brief and to wipe buttocks.  Mod verbal cues and manual facilitation to promote  upright standing with pt maintaining flexed posture despite cues.  Pt returned to recliner to rest until next session.  2) Engaged in therapeutic activity with focus on midline sitting balance and anterior weight shift.  Required increased time, verbal cues, and manual facilitation to promote upright sitting to initiate reaching task.  Pt unable to initiate reaching with RUE and required increased time to initiate with LUE.  Pt required max-total manual facilitation to maintain midline sitting in unsupported sitting at edge of recliner chair.  Repositioned in upright supported sitting in recliner with focus on anterior weight shift to lift buttocks to assist in repositioning, required total +2 in preparation for feeding task with SLP.  Therapy Documentation Precautions:  Precautions Precautions: Fall Precaution Comments: dizziness w/ all movement, nystagmus w/ R gaze, uncoordinated movements Restrictions Weight Bearing Restrictions: No Pain: Pain Assessment Pain Score: 0-No pain ADL: ADL ADL Comments: See FIM  See FIM for current functional status  Therapy/Group: Individual Therapy  Simonne Come 10/07/2013, 12:20 PM

## 2013-10-07 NOTE — Significant Event (Signed)
Rapid Response Event Note  Overview: Time Called: 5208 Arrival Time: 1720 Event Type: Neurologic;Respiratory;Other (Comment);Hypotension (sepsis)  Initial Focused Assessment: Patient lethargic, arousable to touch, answers weakly.  Cold and diaphoretic. Temp 103.2 rectally  BP 68/42  SR 80s occ PAC  RR 28  o2 sat 97% on RA NS infusing at 250cc/hr Wife at bedside  Interventions: Increased NS to 999cc/hr Dr Titus Mould at bedside to consult. Transferred to 3M07 via bed with zoll heart monitor. Began to have periods of apnea during transport. Levophed started at 79mcg/kg Intubated and Central line placed upon arrival 172mcg Fentanyl, 2mg  Versed and 20 mg Etomidate given IV Dopamine started at 69mcg/kg Wife updated on patient status by MD   Event Summary: Name of Physician Notified: Olin Hauser PA at bedside at    Name of Consulting Physician Notified: Dr Titus Mould at 1730  Outcome: Transferred (Comment) [phone])  Event End Time: 1836  Raliegh Ip

## 2013-10-07 NOTE — Procedures (Signed)
Central Venous Catheter Insertion Procedure Note Taylor Keller [id number] [birth year]  Procedure: Insertion of Central Venous Catheter Indications: Assessment of intravascular volume, Drug and/or fluid administration and Frequent blood sampling  Procedure Details Consent: Risks of procedure as well as the alternatives and risks of each were explained to the (patient/caregiver).  Consent for procedure obtained. Time Out: Verified patient identification, verified procedure, site/side was marked, verified correct patient position, special equipment/implants available, medications/allergies/relevent history reviewed, required imaging and test results available.  Performed  Maximum sterile technique was used including antiseptics, cap, gloves, gown, hand hygiene, mask and sheet. Skin prep: Chlorhexidine; local anesthetic administered A antimicrobial bonded/coated triple lumen catheter was placed in the right internal jugular vein using the Seldinger technique.  Evaluation Blood flow good Complications: No apparent complications Patient did tolerate procedure well. Chest X-ray ordered to verify placement.  CXR: pending.  Taylor Keller 10/07/2013, 6:24 PM  US guidance Septic shock  Taylor Keller. Titus Mould, MD, [place] Pgr: Taylor Keller

## 2013-10-07 NOTE — Progress Notes (Signed)
Pam Love PA-C was notified about pt's VS: T:103,BP: 61/41. Pt. Is very diaphoretic and very lethargic,IV was started on and orders for IV 500 cc bolus of NS @ 250 were received.BP was recheck 71/43 @ 1700.Rapid response nurse at bedside.

## 2013-10-07 NOTE — Progress Notes (Signed)
eLink Physician-Brief Progress Note Patient Name: DARNEL MCHAN DOB: 1935-07-23 MRN: [id number]   Date of Service  10/07/2013  HPI/Events of Note  Previous ICH, to rehab, deteriorated, septic and hypotensive.  eICU Interventions  Sepsis protocol started.        YACOUB,WESAM 10/07/2013, 6:38 PM

## 2013-10-07 NOTE — Progress Notes (Addendum)
ANTIBIOTIC CONSULT NOTE - INITIAL  Pharmacy Consult for vancomycin / zosyn Indication: FUO  Allergies  Allergen Reactions  . Benazepril Cough    Patient Measurements: Height: 5\' 11"  (180.3 cm) Weight: 171 lb 8 oz (77.792 kg) IBW/kg (Calculated) : 75.3   Vital Signs: Temp: 103.2 F (39.6 C) (09/01 1634) Temp src: Rectal (09/01 1634) BP: 71/43 mmHg (09/01 1700) Pulse Rate: 83 (09/01 1700) Intake/Output from previous day: 08/31 0701 - 09/01 0700 In: 180 [P.O.:180] Out: 590 [Urine:590] Intake/Output from this shift: Total I/O In: 180 [P.O.:180] Out: -   Labs:  Recent Labs  10/06/13 0954  WBC 9.4  HGB 11.8*  PLT 353  CREATININE 1.33   Estimated Creatinine Clearance: 50.3 ml/min (by C-G formula based on Cr of 1.33). No results found for this basename: VANCOTROUGH, Corlis Leak, VANCORANDOM, GENTTROUGH, GENTPEAK, GENTRANDOM, TOBRATROUGH, TOBRAPEAK, TOBRARND, AMIKACINPEAK, AMIKACINTROU, AMIKACIN,  in the last 72 hours   Microbiology: Recent Results (from the past 720 hour(s))  CULTURE, BLOOD (ROUTINE X 2)     Status: None   Collection Time    09/08/13  6:49 AM      Result Value Ref Range Status   Specimen Description BLOOD RIGHT ANTECUBITAL   Final   Special Requests BOTTLES DRAWN AEROBIC AND ANAEROBIC 4CC   Final   Culture  Setup Time     Final   Value: 09/08/2013 16:09     Performed at [facility]   Culture     Final   Value: NO GROWTH 5 DAYS     Performed at [facility]   Report Status 09/14/2013 FINAL   Final  CULTURE, BLOOD (ROUTINE X 2)     Status: None   Collection Time    09/08/13  6:55 AM      Result Value Ref Range Status   Specimen Description BLOOD LEFT ANTECUBITAL   Final   Special Requests BOTTLES DRAWN AEROBIC AND ANAEROBIC [facility]   Final   Culture  Setup Time     Final   Value: 09/08/2013 16:09     Performed at [facility]   Culture     Final   Value: NO GROWTH 5 DAYS     Performed at [facility]   Report  Status 09/14/2013 FINAL   Final  MRSA PCR SCREENING     Status: None   Collection Time    09/25/13  3:11 AM      Result Value Ref Range Status   MRSA by PCR NEGATIVE  NEGATIVE Final   Comment:            The GeneXpert MRSA Assay (FDA     approved for NASAL specimens     only), is one component of a     comprehensive MRSA colonization     surveillance program. It is not     intended to diagnose MRSA     infection nor to guide or     monitor treatment for     MRSA infections.  URINE CULTURE     Status: None   Collection Time    10/06/13  5:21 PM      Result Value Ref Range Status   Specimen Description URINE, CATHETERIZED   Final   Special Requests septra   Final   Culture  Setup Time     Final   Value: 10/06/2013 18:37     Performed at [place]     Final  Value: NO GROWTH     Performed at [facility]   Culture     Final   Value: NO GROWTH     Performed at [facility]   Report Status 10/07/2013 FINAL   Final    Medical History: Past Medical History  Diagnosis Date  . Lumbar stenosis     L5  . Hypothyroidism   . Hyperlipidemia   . Hypertension   . ED (erectile dysfunction)   . Internal carotid artery stenosis 5/15    bilateral, 40-59%  . Myocardial infarction 08/2013  . Heart murmur   . Stroke ~ 2011    "slight; no permanent damage"     Assessment: 78yom admitted with ICH, s/p cardiac arrest and MI and 2 stents places 7/15.  He is currently in rehab to regain strength and mobility.  He developed a fever Tm 103, Ucx neg, Cxr resolving infiltrate, Bcx in process, WBC wnl, renal fx stable.  Plan to begin broad spectrum ABX and monitor.     Goal of Therapy:  Vancomycin trough level 15-20 mcg/ml  Plan:  Vancomycin 1gm IV x1 then 750mg  IV q12hr Zosyn 3.375gm IV q8 EI  Bonnita Nasuti Pharm.D. CPP, BCPS Clinical Pharmacist [phone] 10/07/2013 5:32 PM

## 2013-10-07 NOTE — Procedures (Signed)
Intubation Procedure Note Taylor Keller [id number] 03-03-1935  Procedure: Intubation Indications: Respiratory insufficiency  Procedure Details Consent: Unable to obtain consent because of emergent medical necessity. Time Out: Verified patient identification, verified procedure, site/side was marked, verified correct patient position, special equipment/implants available, medications/allergies/relevent history reviewed, required imaging and test results available.  Performed  Maximum sterile technique was used including cap, gloves, gown and hand hygiene.  MAC and 4    Evaluation Hemodynamic Status: Persistent hypotension treated with pressors; O2 sats: stable throughout Patient's Current Condition: unstable Complications: No apparent complications Patient did tolerate procedure well. Chest X-ray ordered to verify placement.  CXR: pending.   Raylene Miyamoto 10/07/2013  [place] Titus Mould, MD, [place] Pgr: [facility]

## 2013-10-07 NOTE — H&P (Signed)
PULMONARY / CRITICAL CARE MEDICINE   Name: Taylor Keller MRN: [id number] DOB: [birth year]    ADMISSION DATE:  10/07/2013 CONSULTATION DATE:  10/07/2013  REFERRING MD :  Inpatient rehab  CHIEF COMPLAINT:  fever  INITIAL PRESENTATION: 78 year old male with recent cardiac arrest 2nd to MI, and cerebellar hemorrhage, the latter of which he was discharged for to inpatient rehab 8/25. While there, he developed fevers and lethargy. Medical staff there was concerned for sepsis, PCCM called to readmit.   STUDIES:  CT head 8/31 > No new intracranial hemorrhage. No evidence of an ischemic infarct. No evidence of hydrocephalus. There has been evolution of the right cerebellar infarct, which measures smaller and is less dense, although the overall mass-effect  is similar. There is significantly less intraventricular hemorrhage.   SIGNIFICANT EVENTS: 8/25 - discharged from NICU to inpatient rehab 9/1 readmitted to NICU for presumed septic shock, resp failure, fever 103  HISTORY OF PRESENT ILLNESS:  78 y.o. male with a history of cardiac arrest in July in [place]. He had prolonged ICU stay and 8 days on ventilator. Underwent therapeutic hypothermia 8/25 he presented to [facility] ED for headache. CT scan showed cerebellar hemorrhage. He was admitted to the neuro ICU for further evaluation and treatment. The patient had mild dysarthria and right-sided dysmetria. He however remained neurologically stable.  He was felt to be a good patient for inpatient rehabilitation.  He was discharged to there 8/25 with plans to reintroduce Plavix after 2 weeks when a followup CT scan was planned and if there was no hyperdense blood on the CT scan and Plavix will be added.  8/31 the patient developed fevers and lethargy that only worsened on 9/1. Temperature was 103F. Cultures and labs were ordered, and antibiotics started at rehab, but they felt he was getting sicker. PCCM was called to evaluate.   PAST MEDICAL HISTORY :   Past Medical History  Diagnosis Date  . Lumbar stenosis     L5  . Hypothyroidism   . Hyperlipidemia   . Hypertension   . ED (erectile dysfunction)   . Internal carotid artery stenosis 5/15    bilateral, 40-59%  . Myocardial infarction 08/2013  . Heart murmur   . Stroke ~ 2011    "slight; no permanent damage"   Past Surgical History  Procedure Laterality Date  . Tonsillectomy and adenoidectomy    . Appendectomy    . Coronary angioplasty with stent placement  08/2013    "2"  . Cataract extraction, bilateral Bilateral ~ 2012   Prior to Admission medications   Medication Sig Start Date End Date Taking? Authorizing Provider  acetaminophen (TYLENOL) 325 MG tablet Take 2 tablets (650 mg total) by mouth every 4 (four) hours as needed for headache or mild pain. 09/10/13   Erlene Quan, PA-C  albuterol (PROVENTIL) (5 MG/ML) 0.5% nebulizer solution Take 2.5 mg by nebulization every 6 (six) hours as needed for wheezing or shortness of breath.    Historical Provider, MD  aspirin 81 MG chewable tablet Chew 81 mg by mouth daily.    Historical Provider, MD  atorvastatin (LIPITOR) 80 MG tablet Take 80 mg by mouth every evening.    Historical Provider, MD  clopidogrel (PLAVIX) 75 MG tablet Take 75 mg by mouth daily.    Historical Provider, MD  diphenhydrAMINE (BENADRYL) 25 MG tablet Take 25 mg by mouth every 6 (six) hours as needed.    Historical Provider, MD  furosemide (LASIX) 40 MG tablet Take  1 tablet (40 mg total) by mouth daily. 09/11/13   Erlene Quan, PA-C  ipratropium (ATROVENT) 0.02 % nebulizer solution Take 0.5 mg by nebulization every 6 (six) hours as needed for wheezing or shortness of breath.    Historical Provider, MD  isosorbide mononitrate (IMDUR) 60 MG 24 hr tablet Take 60 mg by mouth daily.    Historical Provider, MD  lansoprazole (PREVACID SOLUTAB) 30 MG disintegrating tablet Take 30 mg by mouth daily.    Historical Provider, MD  levothyroxine (SYNTHROID, LEVOTHROID) 175 MCG tablet  Take 175 mcg by mouth daily.    Historical Provider, MD  losartan (COZAAR) 25 MG tablet Take 1 tablet (25 mg total) by mouth daily. 09/10/13   Erlene Quan, PA-C  metoprolol (LOPRESSOR) 50 MG tablet Take 50 mg by mouth 2 (two) times daily.    Historical Provider, MD  nitroGLYCERIN (NITROSTAT) 0.4 MG SL tablet Place 1 tablet (0.4 mg total) under the tongue every 5 (five) minutes as needed for chest pain. 09/12/13   Darlin Coco, MD  zolpidem (AMBIEN) 5 MG tablet Take 1 tablet (5 mg total) by mouth at bedtime as needed for sleep. 09/15/13   Susy Frizzle, MD   Allergies  Allergen Reactions  . Benazepril Cough    FAMILY HISTORY:  Family History  Problem Relation Age of Onset  . Colon cancer Sister     colon   SOCIAL HISTORY:  reports that he has never smoked. He has never used smokeless tobacco. He reports that he drinks alcohol. He reports that he does not use illicit drugs.  REVIEW OF SYSTEMS:  Unable due to lethargy  SUBJECTIVE: unresponsive intermittent, hypoxia, profound shock  VITAL SIGNS: Temp:  [98 F (36.7 C)-103.2 F (39.6 C)] 103.2 F (39.6 C) (09/01 1634) Pulse Rate:  [54-100] 74 (09/01 1915) Resp:  [14-24] 21 (09/01 1915) BP: (61-167)/(40-80) 157/62 mmHg (09/01 1900) SpO2:  [95 %-100 %] 100 % (09/01 1915) Arterial Line BP: (120-195)/(43-75) 145/64 mmHg (09/01 1915) FiO2 (%):  [100 %] 100 % (09/01 1811) Weight:  [77.8 kg (171 lb 8.3 oz)] 77.8 kg (171 lb 8.3 oz) (09/01 1811) HEMODYNAMICS:   VENTILATOR SETTINGS: Vent Mode:  [-] PRVC FiO2 (%):  [100 %] 100 % Set Rate:  [20 bmp] 20 bmp Vt Set:  [786 mL] 620 mL PEEP:  [5 cmH20] 5 cmH20 Plateau Pressure:  [12 cmH20] 12 cmH20 INTAKE / OUTPUT: No intake or output data in the 24 hours ending 10/07/13 1925  PHYSICAL EXAMINATION: General:  Elderly male, lethargic Neuro:  Alert to verbal, minimally responsive, moves all ext equal HEENT:  [place]/AT, PERRL, no JVD noted Cardiovascular:  Loletha Grayer, regular Lungs:  Periods of  apnea, breath sounds mildly coarse. Abdomen:  Soft, non-distended. Mildly tender RUQ Musculoskeletal:  Frail, no acute deformity Skin:  Intact  LABS:  CBC  Recent Labs Lab 10/01/13 0740 10/06/13 0954 10/07/13 1834  WBC 10.7* 9.4 20.5*  HGB 10.2* 11.8* 11.0*  HCT 31.5* 37.0* 34.6*  PLT 452* 353 394   Coag's No results found for this basename: APTT, INR,  in the last 168 hours BMET  Recent Labs Lab 10/01/13 0740 10/06/13 0954  NA 133* 130*  K 4.1 4.6  CL 94* 93*  CO2 24 24  BUN 14 19  CREATININE 1.11 1.33  GLUCOSE 117* 115*   Electrolytes  Recent Labs Lab 10/01/13 0740 10/06/13 0954  CALCIUM 9.1 9.2   Sepsis Markers No results found for this basename: LATICACIDVEN, PROCALCITON, O2SATVEN,  in the last 168 hours ABG  Recent Labs Lab 10/07/13 1835  PHART 7.412  PCO2ART 39.7  PO2ART 521.0*   Liver Enzymes  Recent Labs Lab 10/01/13 0740  AST 11  ALT 10  ALKPHOS 71  BILITOT 0.2*  ALBUMIN 2.5*   Cardiac Enzymes No results found for this basename: TROPONINI, PROBNP,  in the last 168 hours Glucose No results found for this basename: GLUCAP,  in the last 168 hours  Imaging Dg Chest 2 View  10/06/2013   CLINICAL DATA:  Cough  EXAM: CHEST  2 VIEW  COMPARISON:  09/08/2013  FINDINGS: Cardiac shadow is stable. Previously seen basilar changes have resolved in the interval. No the significant residual infiltrate or effusion is noted. Degenerative changes of the thoracic spine are again seen.  IMPRESSION: Interval clearing of bibasilar changes.   Electronically Signed   By: Inez Catalina M.D.   On: 10/06/2013 09:48   Ct Head Wo Contrast  10/06/2013   CLINICAL DATA:  Followup bleed.  Lethargy.  EXAM: CT HEAD WITHOUT CONTRAST  TECHNIQUE: Contiguous axial images were obtained from the base of the skull through the vertex without intravenous contrast.  COMPARISON:  09/28/2013.  FINDINGS: The right cerebellar hemispheric infarct measures slightly smaller, now 3.5 cm x  2.4 cm transversely, previously 3.7 cm x 2.8 cm. Surrounding vasogenic edema and mass effect are similar with a similar degree of shift of the posterior fossa midline to the left. Fourth ventricular hemorrhage noted on the prior study is not evident currently. There is a small amount of dependent hemorrhage in the occipital horn of the left lateral ventricle, with the lateral ventricular hemorrhage also decreased. Compression of the lower fourth ventricle is similar the prior study. The lateral ventricles are normal in configuration. There is ventricular and sulcal enlargement reflecting atrophy. No evidence of hydrocephalus.  There is no new intracranial hemorrhage. There is no evidence of an ischemic infarct.  Visualized sinuses and mastoid air cells are clear.  IMPRESSION: 1. No new intracranial hemorrhage. No evidence of an ischemic infarct. No evidence of hydrocephalus. 2. There has been evolution of the right cerebellar infarct, which measures smaller and is less dense, although the overall mass-effect is similar. There is significantly less intraventricular hemorrhage.   Electronically Signed   By: Lajean Manes M.D.   On: 10/06/2013 21:14     ASSESSMENT / PLAN:  PULMONARY OETT 9/1 >>> A: Acute respiratory failure  P:   Full vent support, PRVC, 8cc/kg, f20, PEEP 5 Titrate FiO2 for SpO2 greater than 92% Check ABG, avoid acidosis with prior bleed CXR in AM  CARDIOVASCULAR CVL- RIJ 9/1 >>> A:  hypovolemia Septic shock Bradycardia Known CAD< r/o ischemia  P:  MAP goal > 70, has some carotid dz Aggressive IVF resuscitation Levophed for MAP goal Dopamine for bradycardia to bet affect 5-7.5 mics and no increase Trend lactic acid Trend troponin ecg cvp goal 12 STAT cortisol TSH ( brady)  RENAL A:   AKI - baseline creat 1.1 hypovolemia  P:   Hydrate, cvp, goals 12 Follow Bmet Bolus aggressive foley  GASTROINTESTINAL A:   No acute issues  P:   SUP: IV PPI Check ,a  my, lips, ldh, lactic likely feed in am   HEMATOLOGIC A:   Anemia - chronic Leukocytosis dvt risk  P:  Follow Bmet Sub q hep ( no changes in recent head CT)  INFECTIOUS A:   Septic shock, etiology unclear at this time.  R/o aspiration (no cns  drains, no abdo symptoms, urine unimpressive)  P:   BCx2 9/1 >>> UC 9/1 >>> Sputum 9/1 >>> Abx: Zosyn, start date 9/1>>> Abx: Vanc, start date 9/1>>> Trend PCT Trend WBC and fever curve If clinically declines consider cns or intraabdominal sources.  Repeat UA  ENDOCRINE A:   Hypothyroid R/o rel AI P:   Check TSH CBG monitoring SSI  NEUROLOGIC A:   Acute encephalopathy - secondary to hypotension in setting carotid dz Recent cerebellar bleed without hydro P:   RASS goal: -1 PAD 1 for sedation Ct head just done Avoid benzo  Georgann Housekeeper, ACNP [place] [facility] Pager [phone] or [phone]   I have personally obtained a history, examined the patient, evaluated laboratory and imaging results, formulated the assessment and plan and placed orders. CRITICAL CARE: The patient is critically ill with multiple organ systems failure and requires high complexity decision making for assessment and support, frequent evaluation and titration of therapies, application of advanced monitoring technologies and extensive interpretation of multiple databases. Critical Care Time devoted to patient care services described in this note is 85 m minutes.   Lavon Paganini. Titus Mould, MD, [place] Pgr: [place] [facility]

## 2013-10-07 NOTE — Progress Notes (Signed)
Note and chart reviewed. Agree with intervention. Note: Pt's diet was downgraded to Dysphagia 1 with nectar-thick liquids at 1450 hr today. All supplements must be thickened to appropriate consistency.   Pryor Ochoa RD, LDN Inpatient Clinical Dietitian Pager: [phone] After Hours Pager: [phone]

## 2013-10-07 NOTE — Procedures (Signed)
Arterial Catheter Insertion Procedure Note Taylor Keller [id number] Aug 11, 1935  Procedure: Insertion of Arterial Catheter  Indications: Blood pressure monitoring and Frequent blood sampling  Procedure Details Consent: Risks of procedure as well as the alternatives and risks of each were explained to the (patient/caregiver).  Consent for procedure obtained. Time Out: Verified patient identification, verified procedure, site/side was marked, verified correct patient position, special equipment/implants available, medications/allergies/relevent history reviewed, required imaging and test results available.  Performed  Maximum sterile technique was used including antiseptics, cap, gloves, gown, hand hygiene, mask and sheet. Skin prep: Chlorhexidine; local anesthetic administered 20 gauge catheter was inserted into left radial artery using the Seldinger technique.  Evaluation Blood flow good; BP tracing good. Complications: No apparent complications. [place] gudiance  Taylor Keller J. Titus Mould, MD, FACP Pgr: [place] J. 10/07/2013

## 2013-10-08 ENCOUNTER — Ambulatory Visit (HOSPITAL_COMMUNITY): Payer: Medicare HMO

## 2013-10-08 ENCOUNTER — Inpatient Hospital Stay (HOSPITAL_COMMUNITY): Payer: Medicare HMO

## 2013-10-08 ENCOUNTER — Inpatient Hospital Stay (HOSPITAL_COMMUNITY): Payer: Medicare HMO | Admitting: Physical Therapy

## 2013-10-08 ENCOUNTER — Encounter (HOSPITAL_COMMUNITY): Payer: Medicare HMO | Admitting: Speech Pathology

## 2013-10-08 ENCOUNTER — Encounter (HOSPITAL_COMMUNITY): Payer: Medicare HMO | Admitting: Occupational Therapy

## 2013-10-08 DIAGNOSIS — R6520 Severe sepsis without septic shock: Secondary | ICD-10-CM

## 2013-10-08 DIAGNOSIS — I2510 Atherosclerotic heart disease of native coronary artery without angina pectoris: Secondary | ICD-10-CM

## 2013-10-08 DIAGNOSIS — A419 Sepsis, unspecified organism: Secondary | ICD-10-CM | POA: Diagnosis present

## 2013-10-08 DIAGNOSIS — I619 Nontraumatic intracerebral hemorrhage, unspecified: Secondary | ICD-10-CM

## 2013-10-08 DIAGNOSIS — J9600 Acute respiratory failure, unspecified whether with hypoxia or hypercapnia: Secondary | ICD-10-CM

## 2013-10-08 DIAGNOSIS — R6521 Severe sepsis with septic shock: Secondary | ICD-10-CM

## 2013-10-08 LAB — CORTISOL: CORTISOL PLASMA: 27.9 ug/dL

## 2013-10-08 LAB — BASIC METABOLIC PANEL
Anion gap: 15 (ref 5–15)
BUN: 34 mg/dL — AB (ref 6–23)
CHLORIDE: 98 meq/L (ref 96–112)
CO2: 19 mEq/L (ref 19–32)
Calcium: 8.8 mg/dL (ref 8.4–10.5)
Creatinine, Ser: 1.65 mg/dL — ABNORMAL HIGH (ref 0.50–1.35)
GFR calc Af Amer: 45 mL/min — ABNORMAL LOW (ref >90)
GFR calc non Af Amer: 39 mL/min — ABNORMAL LOW (ref >90)
GLUCOSE: 84 mg/dL (ref 70–99)
POTASSIUM: 4.1 meq/L (ref 3.7–5.3)
Sodium: 132 mEq/L — ABNORMAL LOW (ref 137–147)

## 2013-10-08 LAB — POCT I-STAT 3, ART BLOOD GAS (G3+)
ACID-BASE EXCESS: 1 mmol/L (ref 0.0–2.0)
Bicarbonate: 21.8 mEq/L (ref 20.0–24.0)
O2 SAT: 100 %
TCO2: 23 mmol/L (ref 0–100)
pCO2 arterial: 28 mmHg — ABNORMAL LOW (ref 35.0–45.0)
pH, Arterial: 7.507 — ABNORMAL HIGH (ref 7.350–7.450)
pO2, Arterial: 205 mmHg — ABNORMAL HIGH (ref 80.0–100.0)

## 2013-10-08 LAB — GLUCOSE, CAPILLARY
GLUCOSE-CAPILLARY: 77 mg/dL (ref 70–99)
GLUCOSE-CAPILLARY: 126 mg/dL — AB (ref 70–99)
GLUCOSE-CAPILLARY: 109 mg/dL — AB (ref 70–99)
Glucose-Capillary: 124 mg/dL — ABNORMAL HIGH (ref 70–99)
Glucose-Capillary: 131 mg/dL — ABNORMAL HIGH (ref 70–99)
Glucose-Capillary: 107 mg/dL — ABNORMAL HIGH (ref 70–99)

## 2013-10-08 LAB — LACTIC ACID, PLASMA
Lactic Acid, Venous: 1.2 mmol/L (ref 0.5–2.2)
Lactic Acid, Venous: 1 mmol/L (ref 0.5–2.2)

## 2013-10-08 LAB — CBC
HEMATOCRIT: 33.8 % — AB (ref 39.0–52.0)
Hemoglobin: 10.9 g/dL — ABNORMAL LOW (ref 13.0–17.0)
MCH: 26.6 pg (ref 26.0–34.0)
MCHC: 32.2 g/dL (ref 30.0–36.0)
MCV: 82.4 fL (ref 78.0–100.0)
Platelets: 304 10*3/uL (ref 150–400)
RBC: 4.1 MIL/uL — AB (ref 4.22–5.81)
RDW: 17.3 % — ABNORMAL HIGH (ref 11.5–15.5)
WBC: 19.8 10*3/uL — AB (ref 4.0–10.5)

## 2013-10-08 LAB — URINALYSIS, ROUTINE W REFLEX MICROSCOPIC
GLUCOSE, UA: NEGATIVE mg/dL
Ketones, ur: NEGATIVE mg/dL
Nitrite: NEGATIVE
PH: 5 (ref 5.0–8.0)
PROTEIN: 30 mg/dL — AB
SPECIFIC GRAVITY, URINE: 1.027 (ref 1.005–1.030)
Urobilinogen, UA: 1 mg/dL (ref 0.0–1.0)

## 2013-10-08 LAB — URINE MICROSCOPIC-ADD ON

## 2013-10-08 LAB — TROPONIN I
TROPONIN I: 0.71 ng/mL — AB (ref <0.30)
Troponin I: 1 ng/mL (ref <0.30)

## 2013-10-08 LAB — PROCALCITONIN: PROCALCITONIN: 7.43 ng/mL

## 2013-10-08 MED ORDER — VITAMIN K1 10 MG/ML IJ SOLN
2.0000 mg | Freq: Once | INTRAMUSCULAR | Status: AC
  Administered 2013-10-08: 2 mg via INTRAVENOUS
  Filled 2013-10-08: qty 0.2

## 2013-10-08 MED ORDER — ADULT MULTIVITAMIN W/MINERALS CH: 1.0000 | ORAL_TABLET | Freq: Every day | ORAL | Status: DC

## 2013-10-08 MED ORDER — DEXTROSE 5 % IV SOLN
1.0000 g | Freq: Two times a day (BID) | INTRAVENOUS | Status: DC
  Administered 2013-10-08 (×2): 1 g via INTRAVENOUS
  Filled 2013-10-08 (×4): qty 1

## 2013-10-08 MED ORDER — PRO-STAT SUGAR FREE PO LIQD
30.0000 mL | Freq: Two times a day (BID) | ORAL | Status: DC
  Administered 2013-10-08 – 2013-10-09 (×2): 30 mL via ORAL
  Filled 2013-10-08 (×8): qty 30

## 2013-10-08 MED ORDER — VITAMIN B-1 100 MG PO TABS
100.0000 mg | ORAL_TABLET | Freq: Every day | ORAL | Status: DC
Start: 2013-10-08 — End: 2013-10-08

## 2013-10-08 MED ORDER — LABETALOL HCL 5 MG/ML IV SOLN
10.0000 mg | INTRAVENOUS | Status: DC | PRN
  Administered 2013-10-08: 10 mg via INTRAVENOUS
  Filled 2013-10-08 (×3): qty 4

## 2013-10-08 MED ORDER — VITAL 1.5 CAL PO LIQD
1000.0000 mL | ORAL | Status: DC
  Administered 2013-10-08: 1000 mL
  Filled 2013-10-08 (×6): qty 1000

## 2013-10-08 MED ORDER — FOLIC ACID 1 MG PO TABS: 1.0000 mg | ORAL_TABLET | Freq: Every day | ORAL | Status: DC

## 2013-10-08 MED ORDER — ACETAMINOPHEN 160 MG/5ML PO SOLN
650.0000 mg | ORAL | Status: DC | PRN
  Administered 2013-10-08 – 2013-10-09 (×2): 650 mg
  Filled 2013-10-08 (×2): qty 20.3

## 2013-10-08 MED ORDER — VITAL HIGH PROTEIN PO LIQD
1000.0000 mL | ORAL | Status: DC
  Filled 2013-10-08 (×2): qty 1000

## 2013-10-08 NOTE — Progress Notes (Signed)
PULMONARY / CRITICAL CARE MEDICINE   Name: Taylor Keller MRN: [id number] DOB: 16-Aug-1935    ADMISSION DATE:  10/07/2013 CONSULTATION DATE:  10/07/2013  REFERRING MD :  Inpatient rehab  CHIEF COMPLAINT:  fever  INITIAL PRESENTATION: 78 year old male with recent cardiac arrest 2nd to MI, and cerebellar hemorrhage, the latter of which he was discharged for to inpatient rehab 8/25. While there, he developed fevers and lethargy. Medical staff there was concerned for sepsis, PCCM called to readmit.   STUDIES:  CT head 8/31 > No new intracranial hemorrhage. No evidence of an ischemic infarct. No evidence of hydrocephalus. There has been evolution of the right cerebellar infarct, which measures smaller and is less dense, although the overall mass-effect  is similar. There is significantly less intraventricular hemorrhage.   SIGNIFICANT EVENTS: 8/25 - discharged from NICU to inpatient rehab 9/1 readmitted to NICU for presumed septic shock, resp failure, fever 103 9/2- improved pressors  SUBJECTIVE: almost off pressors  VITAL SIGNS: Temp:  [97.5 F (36.4 C)-103.2 F (39.6 C)] 97.5 F (36.4 C) (09/02 0316) Pulse Rate:  [54-117] 117 (09/02 0700) Resp:  [14-30] 20 (09/02 0700) BP: (61-185)/(40-72) 121/60 mmHg (09/02 0700) SpO2:  [97 %-100 %] 100 % (09/02 0700) Arterial Line BP: (120-233)/(43-123) 156/56 mmHg (09/02 0700) FiO2 (%):  [50 %-100 %] 50 % (09/02 0324) Weight:  [72.7 kg (160 lb 4.4 oz)-77.8 kg (171 lb 8.3 oz)] 72.7 kg (160 lb 4.4 oz) (09/02 0316) HEMODYNAMICS: CVP:  [0 mmHg-10 mmHg] 10 mmHg VENTILATOR SETTINGS: Vent Mode:  [-] PRVC FiO2 (%):  [50 %-100 %] 50 % Set Rate:  [20 bmp] 20 bmp Vt Set:  [644 mL] 620 mL PEEP:  [5 cmH20] 5 cmH20 Plateau Pressure:  [12 IHK74-25 cmH20] 18 cmH20 INTAKE / OUTPUT:  Intake/Output Summary (Last 24 hours) at 10/08/13 0743 Last data filed at 10/08/13 0700  Gross per 24 hour  Intake 2793.75 ml  Output    750 ml  Net 2043.75 ml     PHYSICAL EXAMINATION: General:  Elderly male, rass -1 Neuro:  rass -1, moves ext as pior, weaker lowers, following come commands HEENT:  [place]/AT, PERRL, no JVD noted Cardiovascular:  s1 s2 rrt Lungs:  Coarse increased Abdomen:  Soft, non-distended. Mildly tender RUQ - resolved Musculoskeletal:  No edema Skin:  Intact  LABS:  CBC  Recent Labs Lab 10/07/13 1834 10/07/13 1929 10/08/13 0630  WBC 20.5* 19.1* 19.8*  HGB 11.0* 10.9* 10.9*  HCT 34.6* 33.7* 33.8*  PLT 394 362 304   Coag's  Recent Labs Lab 10/07/13 1929  APTT 37  INR 1.62*   BMET  Recent Labs Lab 10/07/13 1834 10/07/13 1929 10/08/13 0630  NA 134* 131* 132*  K 4.4 4.4 4.1  CL 97 96 98  CO2 20 16* 19  BUN 34* 34* 34*  CREATININE 2.26* 2.15* 1.65*  GLUCOSE 164* 206* 84   Electrolytes  Recent Labs Lab 10/07/13 1834 10/07/13 1929 10/08/13 0630  CALCIUM 9.2 8.9 8.8   Sepsis Markers  Recent Labs Lab 10/07/13 1834 10/07/13 1929 10/08/13 0010 10/08/13 0630  LATICACIDVEN 1.7  --  1.2 1.0  PROCALCITON  --  8.86  --   --    ABG  Recent Labs Lab 10/07/13 1835  PHART 7.412  PCO2ART 39.7  PO2ART 521.0*   Liver Enzymes  Recent Labs Lab 10/07/13 1834 10/07/13 1929  AST 21 24  ALT 20 19  ALKPHOS 59 60  60  BILITOT 0.4 0.5  ALBUMIN  2.6* 2.6*   Cardiac Enzymes  Recent Labs Lab 10/07/13 1929 10/08/13 0010 10/08/13 0630  TROPONINI 0.85* 0.71* 1.00*   Glucose  Recent Labs Lab 10/07/13 2312 10/08/13 0318  GLUCAP 223* 124*    Imaging Dg Chest Port 1 View  10/07/2013   CLINICAL DATA:  Evaluate line placement  EXAM: PORTABLE CHEST - 1 VIEW  COMPARISON:  10/06/2013  FINDINGS: Endotracheal tube tip is above the carina. There is a right IJ catheter with tip in the projection of the cavoatrial junction. No pneumothorax identified. There is a nasogastric tube with tip in the stomach. Heart size is normal. No pleural or pericardial effusion.  IMPRESSION: 1. The tip of the right IJ  catheter is in the cavoatrial junction. No pneumothorax identified.   Electronically Signed   By: Kerby Moors M.D.   On: 10/07/2013 19:13   Dg Abd Portable 1v  10/07/2013   CLINICAL DATA:  Nasogastric tube placement.  EXAM: PORTABLE ABDOMEN - 1 VIEW  COMPARISON:  None.  FINDINGS: Nasogastric tube tip projects at gastroesophageal junction. Moderate amount of retained large bowel stool. Nonspecific bowel gas pattern partially imaged. Imaged lung bases are clear, central venous catheter at distal superior vena cava region. Tissue planes and included osseous structures are nonsuspicious.  IMPRESSION: Nasogastric tube tip projects at GE junction, recommend advancement.  Moderate retained large bowel stool, partially imaged.   Electronically Signed   By: Elon Alas   On: 10/07/2013 19:03     ASSESSMENT / PLAN:  PULMONARY OETT 9/1 >>> A: Acute respiratory failure  P:   Obtain pcxr now, assess ett and developing infiltrates Keep same MV, obtain abg this am  After abg, consider cpap 5 ps 5, goal 1 hr, assess neuro strength, rsbi  CARDIOVASCULAR CVL- RIJ 9/1 >>> A:  hypovolemia Septic shock Bradycardia resolved likely stress sichemia  P:  Echo Asa when bleeding from ngt resolved Aggressive IVF resuscitation maintain to cvp goals Levophed for MAP goal, to goal 65 now, likley can dc Dopamine off cvp goal 12, not met , allow pos balance STAT cortisol - no need steroids TSH ( brady) - wnl Lactic re assuring  RENAL A:   AKI - baseline creat 1.1 Hypovolemia, hyponatremia  P:   cvp, goals 12 Follow Bmet Foley Maintain fluids  GASTROINTESTINAL A:   NGT irritation  P:   SUP: IV PPI Check ,a my, lips, ldh, lactic - unimpressive likely feed today  HEMATOLOGIC A:   Anemia - chronic Leukocytosis dvt risk coagulapathy ( sepsis?)  P:  Follow Bmet Sub q hep Vit K , repeat coags  INFECTIOUS A:   Septic shock, etiology unclear R/o aspiration  P:   BCx2 9/1  >>> UC 9/1 >>> Sputum 9/1 >>> Abx: Zosyn, start date 9/1>>> Abx: Vanc, start date 9/1>>> Trend PCT further If clinically declines consider cns or intraabdominal sources. Has improved overall  ENDOCRINE A:   Hypothyroid No evidence for rel AI P:   CBG monitoring SSI  NEUROLOGIC A:   Acute encephalopathy - secondary to hypotension in setting carotid dz Recent cerebellar bleed without hydro P:   RASS goal: 0 PAD 1 for sedation - fent Dc all versed Avoid benzo Control temps Will ask neuro to re evaluate him  Global: pressors better, continues hydration, source not clear, await pcxr, wean, unlikely to extubate, neuro consult, feed  I have personally obtained a history, examined the patient, evaluated laboratory and imaging results, formulated the assessment and plan and placed orders.  CRITICAL CARE: The patient is critically ill with multiple organ systems failure and requires high complexity decision making for assessment and support, frequent evaluation and titration of therapies, application of advanced monitoring technologies and extensive interpretation of multiple databases. Critical Care Time devoted to patient care services described in this note is 35 m minutes.   Lavon Paganini. Titus Mould, MD, [place] Pgr: [place] [facility]

## 2013-10-08 NOTE — Progress Notes (Signed)
Patient had 13 beat run of V-tach.  Dr. Erlinda Hong made aware and assessed patient. [place], [place]

## 2013-10-08 NOTE — Progress Notes (Signed)
Note/chart reviewed. Agree with note.   Ohanna Gassert RD, LDN, CNSC [phone] Pager [phone] After Hours Pager   

## 2013-10-08 NOTE — Progress Notes (Signed)
UR completed.  Nicolasa Milbrath, RN BSN MHA CCM Trauma/Neuro ICU Case Manager [phone]  

## 2013-10-08 NOTE — Discharge Summary (Signed)
Taylor, Keller NO.:  [id number]  MEDICAL RECORD NO.:  [id number]  LOCATION:                                 FACILITY:  PHYSICIAN:  Taylor Keller, M.D.DATE OF BIRTH:  [birth year]  DATE OF ADMISSION:  09/30/2013 DATE OF DISCHARGE:  10/07/2013                              DISCHARGE SUMMARY   DISCHARGE DIAGNOSES: 1. Suspect sepsis with hypotension. 2. Right cerebellar ICH. 3. Subcutaneous heparin for DVT prophylaxis. 4. Dysphagia. 5. History of coronary artery disease, cardiac arrest in July 2015. 6. Hypothyroidism. 7. Recent urinary tract infection.  HISTORY OF PRESENT ILLNESS:  [name], history of coronary artery disease, cardiac arrest in July 2015, maintained on Plavix.  Presented on September 25, 2013, with headache, nausea, vomiting as well as right-sided weakness.  Denied any recent fall or trauma.  Cranial CT scan, MRI showed a 2.6 x 1.9 cm hematoma in the inferior right cerebellum.  Neurosurgery consulted, advised conservative care.  Noted fall on September 27, 2013, while in the hospital, patient attempt to get out of bed unassisted.  Followup cranial CT scan completed showed interval decrease in size of right cerebellar hemorrhage with slightly increased ventriculomegaly involving the lateral and third 3rd ventricles.  Maintained on a dysphagia II nectar thick liquid diet.  Subcutaneous heparin initiated on September 28, 2013, for DVT prophylaxis.  Urinalysis study on September 28, 2013, positive nitrite with many bacteria, placed on empiric Bactrim.  The patient was admitted for comprehensive rehab program.  PAST MEDICAL HISTORY:  See discharge diagnoses.  SOCIAL HISTORY:  Lives with wife.  FUNCTIONAL HISTORY:  Prior to admission, independent, retired, Theme park manager. Functional status upon admission to rehab services was moderate assist, ambulate 3 feet rolling walker, plus to physical assist sit to stand; mod and max  assist, activities of daily living.  PHYSICAL EXAMINATION:  VITAL SIGNS:  Blood pressure 155/66, pulse 68, temperature 97, respirations 18. GENERAL:  This was an alert male, flat affect.  Made good eye contact with examiner.  He was able to provide his name and age and follows simple commands.  Had some decreased awareness and insight to his deficits. HEENT:  Pupils round and reactive to light. LUNGS:  Clear to auscultation. CARDIAC:  Rate controlled. ABDOMEN:  Soft, nontender.  Good bowel sounds.  REHABILITATION HOSPITAL COURSE:  The patient was admitted to inpatient rehab services with therapies initiated on a 3-hour daily basis consisting of physical therapy, occupational therapy, speech therapy, and rehabilitation nursing.  The following issues were addressed during the patient's rehabilitation stay.  Pertaining to [name]'s right cerebellar ICH remained stable, followed by Neurosurgery.  Latest cranial CT scan on 10/06/2013 showed no new intracranial abnormalities. Evolution of right cerebellar infarct measuring smaller and less dense in prior studies.  Subcutaneous heparin initiated on September 28, 2013, for DVT prophylaxis.  No bleeding episodes.  Maintained on a dysphagia II nectar thick liquid diet, close monitoring of hydration.  Blood pressures monitored on Lasix, Imdur, Cozaar, Lopressor, monitoring for any kind of hypotension.  He did have a history of coronary artery disease, cardiac arrest in July 2015.  He had been on Plavix prior to admission, held  due to the [place] Keller and remained on low-dose aspirin per Cardiology Services.  He had initially been placed on empiric Bactrim for suspect UTI on September 28, 2013, positive nitrite, later discontinued.  The patient with low-grade fever, bouts of hypotension. Blood cultures ordered were pending.  Increased lethargy and somnolence. Followup chemistries showed a elevated white count increased from 10,700 to 20,500, suspect sepsis  rule out aspiration.  He was transferred to [place] for ongoing close monitoring.  Condition guarded. All medication changes were made as per Critical Care Services.     Taylor Keller, P.A.   ______________________________ Taylor Keller, M.D.    DA/MEDQ  D:  10/08/2013  T:  10/08/2013  Job:  [id number]  cc:   Taylor Keller, M.D.

## 2013-10-08 NOTE — Discharge Summary (Deleted)
NAMEULYSSE, SIEMEN NO.:  [id number]  MEDICAL RECORD NO.:  [id number]  LOCATION:                                 FACILITY:  PHYSICIAN:  Charlett Blake, M.D.DATE OF BIRTH:  [birth year]  DATE OF ADMISSION:  09/30/2013 DATE OF DISCHARGE:  10/07/2013                              DISCHARGE SUMMARY   DISCHARGE DIAGNOSES: 1. Suspect sepsis with hypotension. 2. Right cerebellar ICH. 3. Subcutaneous heparin for DVT prophylaxis. 4. Dysphagia. 5. History of coronary artery disease, cardiac arrest in July 2015. 6. Hypothyroidism. 7. Recent urinary tract infection.  HISTORY OF PRESENT ILLNESS:  [name], history of coronary artery disease, cardiac arrest in July 2015, maintained on Plavix.  Presented on September 25, 2013, with headache, nausea, vomiting as well as right-sided weakness.  Denied any recent fall or trauma.  Cranial CT scan, MRI showed a 2.6 x 1.9 cm hematoma in the inferior right cerebellum.  Neurosurgery consulted, advised conservative care.  Noted fall on September 27, 2013, while in the hospital, patient attempt to get out of bed unassisted.  Followup cranial CT scan completed showed interval decrease in size of right cerebellar hemorrhage with slightly increased ventriculomegaly involving the lateral and third 3rd ventricles.  Maintained on a dysphagia II nectar thick liquid diet.  Subcutaneous heparin initiated on September 28, 2013, for DVT prophylaxis.  Urinalysis study on September 28, 2013, positive nitrite with many bacteria, placed on empiric Bactrim.  The patient was admitted for comprehensive rehab program.  PAST MEDICAL HISTORY:  See discharge diagnoses.  SOCIAL HISTORY:  Lives with wife.  FUNCTIONAL HISTORY:  Prior to admission, independent, retired, Theme park manager. Functional status upon admission to rehab services was moderate assist, ambulate 3 feet rolling walker, plus to physical assist sit to stand; mod and max  assist, activities of daily living.  PHYSICAL EXAMINATION:  VITAL SIGNS:  Blood pressure 155/66, pulse 68, temperature 97, respirations 18. GENERAL:  This was an alert male, flat affect.  Made good eye contact with examiner.  He was able to provide his name and age and follows simple commands.  Had some decreased awareness and insight to his deficits. HEENT:  Pupils round and reactive to light. LUNGS:  Clear to auscultation. CARDIAC:  Rate controlled. ABDOMEN:  Soft, nontender.  Good bowel sounds.  REHABILITATION HOSPITAL COURSE:  The patient was admitted to inpatient rehab services with therapies initiated on a 3-hour daily basis consisting of physical therapy, occupational therapy, speech therapy, and rehabilitation nursing.  The following issues were addressed during the patient's rehabilitation stay.  Pertaining to Mr. Kitchings's right cerebellar ICH remained stable, followed by Neurosurgery.  Latest cranial CT scan on 10/06/2013 showed no new intracranial abnormalities. Evolution of right cerebellar infarct measuring smaller and less dense in prior studies.  Subcutaneous heparin initiated on September 28, 2013, for DVT prophylaxis.  No bleeding episodes.  Maintained on a dysphagia II nectar thick liquid diet, close monitoring of hydration.  Blood pressures monitored on Lasix, Imdur, Cozaar, Lopressor, monitoring for any kind of hypotension.  He did have a history of coronary artery disease, cardiac arrest in July 2015.  He had been on Plavix prior to admission, held  due to the [place] and remained on low-dose aspirin per Cardiology Services.  He had initially been placed on empiric Bactrim for suspect UTI on September 28, 2013, positive nitrite, later discontinued.  The patient with low-grade fever, bouts of hypotension. Blood cultures ordered were pending.  Increased lethargy and somnolence. Followup chemistries showed a elevated white count increased from 10,700 to 20,500, suspect sepsis  rule out aspiration.  He was transferred to [place] for ongoing close monitoring.  Condition guarded. All medication changes were made as per Critical Care Services.     Lauraine Rinne, P.A.   ______________________________ Charlett Blake, M.D.    DA/MEDQ  D:  10/08/2013  T:  10/08/2013  Job:  [id number]  cc:   Charlett Blake, M.D.

## 2013-10-08 NOTE — Discharge Summary (Signed)
Discharge summary job # [id number]

## 2013-10-08 NOTE — Progress Notes (Signed)
Bedside EEG completed, results pending. 

## 2013-10-08 NOTE — Procedures (Signed)
History: 78 year old male with a history ofcerebellar hemorrhage and somnolence.  Technique: This is a 17 channel routine scalp EEG performed at the bedside with bipolar and monopolar montages arranged in accordance to the international 10/20 system of electrode placement. One channel was dedicated to EKG recording.    Background: This EEG consists predominately of intermixed delta and theta activities. With painful stimulation, there is an increase in faster activity in the posterior rhythm of 7 Hz is seen. There are at times runs essentially predominant beta activity most consistent with sleep spindles.  Photic stimulation: Physiologic driving is not performed  EEG Abnormalities: 1) generalized irregular slow activity  Clinical Interpretation: This EEG is consistent with a generalized nonspecific cerebral dysfunction (encephalopathy). There was no seizure or seizure predisposition recorded on this study.   Roland Rack, MD [facility] [phone]  If 7pm- 7am, please page neurology on call as listed in [place].

## 2013-10-08 NOTE — Progress Notes (Addendum)
STROKE TEAM CONSULT NOTE   Referring Physician: Dr. Titus Mould    Chief Complaint: sepsis and AMS  HPI: Taylor Keller is an 78 y.o. male just recently discharged to rehab with right cerebellar hemorrhage. As per wife, he apparently doing well in rehab. However, developed low grade fever and bouts of hypotension, as well as increased lethargy and somnolence. Yesterday showed marked elevated WBC count and suspect sepsis. He was transferred to [facility] and admitted by [facility] team. Repeat CT head showed evolution of right cerebellar hemorrhage with near resolution of IVH and no hydrocephalus. He had hypotension after admission and was put on pressors. He also became apnea during bronchoscopy, and he was intubated. Bronch did not show aspiration or pneumonia. UA was negative also. Blood culture pending.  Neurology was consulted for AMS. Stroke is not the reason leading to this admission.   Past Medical History  Diagnosis Date  . Lumbar stenosis     L5  . Hypothyroidism   . Hyperlipidemia   . Hypertension   . ED (erectile dysfunction)   . Internal carotid artery stenosis 5/15    bilateral, 40-59%  . Myocardial infarction 08/2013  . Heart murmur   . Stroke ~ 2011    "slight; no permanent damage"    Past Surgical History  Procedure Laterality Date  . Tonsillectomy and adenoidectomy    . Appendectomy    . Coronary angioplasty with stent placement  08/2013    "2"  . Cataract extraction, bilateral Bilateral ~ 2012    Family History  Problem Relation Age of Onset  . Colon cancer Sister     colon   Social History:  reports that he has never smoked. He has never used smokeless tobacco. He reports that he drinks alcohol. He reports that he does not use illicit drugs.  Allergies:  Allergies  Allergen Reactions  . Benazepril Cough    Medications: I have reviewed the patient's current medications.  REVIEW OF SYSTEMS: not able to perform due to AMS and intubated.    Results for orders  placed during the hospital encounter of 10/07/13 (from the past 48 hour(s))  POCT I-STAT 3, ART BLOOD GAS (G3+)     Status: Abnormal   Collection Time    10/07/13  6:35 PM      Result Value Ref Range   pH, Arterial 7.412  7.350 - 7.450   pCO2 arterial 39.7  35.0 - 45.0 mmHg   pO2, Arterial 521.0 (*) 80.0 - 100.0 mmHg   Bicarbonate 25.3 (*) 20.0 - 24.0 mEq/L   TCO2 26  0 - 100 mmol/L   O2 Saturation 100.0     Acid-Base Excess 1.0  0.0 - 2.0 mmol/L   Patient temperature 98.6 F     Collection site ARTERIAL LINE     Drawn by Operator     Sample type ARTERIAL    CORTISOL     Status: None   Collection Time    10/07/13  7:29 PM      Result Value Ref Range   Cortisol, Plasma 27.9     Comment: (NOTE)     AM:  4.3 - 22.4 ug/dL     PM:  3.1 - 16.7 ug/dL     Performed at [facility]  TSH     Status: Abnormal   Collection Time    10/07/13  7:29 PM      Result Value Ref Range   TSH 5.240 (*) 0.350 - 4.500 uIU/mL  PROTIME-INR     Status: Abnormal   Collection Time    10/07/13  7:29 PM      Result Value Ref Range   Prothrombin Time 19.2 (*) 11.6 - 15.2 seconds   INR 1.62 (*) 0.00 - 1.49  APTT     Status: None   Collection Time    10/07/13  7:29 PM      Result Value Ref Range   aPTT 37  24 - 37 seconds   Comment:            IF BASELINE aPTT IS ELEVATED,     SUGGEST PATIENT RISK ASSESSMENT     BE USED TO DETERMINE APPROPRIATE     ANTICOAGULANT THERAPY.  FIBRINOGEN     Status: Abnormal   Collection Time    10/07/13  7:29 PM      Result Value Ref Range   Fibrinogen 730 (*) 204 - 475 mg/dL  TYPE AND SCREEN     Status: None   Collection Time    10/07/13  7:29 PM      Result Value Ref Range   ABO/RH(D) B POS     Antibody Screen NEG     Sample Expiration 10/10/2013    TROPONIN I     Status: Abnormal   Collection Time    10/07/13  7:29 PM      Result Value Ref Range   Troponin I 0.85 (*) <0.30 ng/mL   Comment:            Due to the release kinetics of cTnI,     a  negative result within the first hours     of the onset of symptoms does not rule out     myocardial infarction with certainty.     If myocardial infarction is still suspected,     repeat the test at appropriate intervals.     CRITICAL RESULT CALLED TO, READ BACK BY AND VERIFIED WITH:     LILLIEY T,RN 10/07/13 2248 WAYK  COMPREHENSIVE METABOLIC PANEL     Status: Abnormal   Collection Time    10/07/13  7:29 PM      Result Value Ref Range   Sodium 131 (*) 137 - 147 mEq/L   Potassium 4.4  3.7 - 5.3 mEq/L   Chloride 96  96 - 112 mEq/L   CO2 16 (*) 19 - 32 mEq/L   Glucose, Bld 206 (*) 70 - 99 mg/dL   BUN 34 (*) 6 - 23 mg/dL   Creatinine, Ser 2.15 (*) 0.50 - 1.35 mg/dL   Calcium 8.9  8.4 - 10.5 mg/dL   Total Protein 7.6  6.0 - 8.3 g/dL   Albumin 2.6 (*) 3.5 - 5.2 g/dL   AST 24  0 - 37 U/L   ALT 19  0 - 53 U/L   Alkaline Phosphatase 60  39 - 117 U/L   Total Bilirubin 0.5  0.3 - 1.2 mg/dL   GFR calc non Af Amer 28 (*) >90 mL/min   GFR calc Af Amer 33 (*) >90 mL/min   Comment: (NOTE)     The eGFR has been calculated using the CKD EPI equation.     This calculation has not been validated in all clinical situations.     eGFR's persistently <90 mL/min signify possible Chronic Kidney     Disease.   Anion gap 19 (*) 5 - 15  CBC     Status: Abnormal   Collection Time  10/07/13  7:29 PM      Result Value Ref Range   WBC 19.1 (*) 4.0 - 10.5 K/uL   RBC 4.06 (*) 4.22 - 5.81 MIL/uL   Hemoglobin 10.9 (*) 13.0 - 17.0 g/dL   HCT 33.7 (*) 39.0 - 52.0 %   MCV 83.0  78.0 - 100.0 fL   MCH 26.8  26.0 - 34.0 pg   MCHC 32.3  30.0 - 36.0 g/dL   RDW 17.5 (*) 11.5 - 15.5 %   Platelets 362  150 - 400 K/uL  LACTATE DEHYDROGENASE     Status: Abnormal   Collection Time    10/07/13  7:29 PM      Result Value Ref Range   LDH 255 (*) 94 - 250 U/L  AMYLASE     Status: None   Collection Time    10/07/13  7:29 PM      Result Value Ref Range   Amylase 60  0 - 105 U/L   Comment: CALLED TO WILLIAMS L,RN  10/07/13 2258 WAYK     CORRECTED ON 09/01 AT 2255: PREVIOUSLY REPORTED AS 3  LIPASE, BLOOD     Status: None   Collection Time    10/07/13  7:29 PM      Result Value Ref Range   Lipase 36  11 - 59 U/L  ALKALINE PHOSPHATASE     Status: None   Collection Time    10/07/13  7:29 PM      Result Value Ref Range   Alkaline Phosphatase 60  39 - 117 U/L   Comment: CALLED TO WILLIAMS L,RN 10/07/13 2258 [place] ON 09/01 AT 2255: PREVIOUSLY REPORTED AS 5  PROCALCITONIN     Status: None   Collection Time    10/07/13  7:29 PM      Result Value Ref Range   Procalcitonin 8.86     Comment:            Interpretation:     PCT > 2 ng/mL:     Systemic infection (sepsis) is likely,     unless other causes are known.     (NOTE)             ICU PCT Algorithm               Non ICU PCT Algorithm        ----------------------------     ------------------------------             PCT < 0.25 ng/mL                 PCT < 0.1 ng/mL         Stopping of antibiotics            Stopping of antibiotics           strongly encouraged.               strongly encouraged.        ----------------------------     ------------------------------           PCT level decrease by               PCT < 0.25 ng/mL           >= 80% from peak PCT           OR PCT 0.25 - 0.5 ng/mL          Stopping of antibiotics  encouraged.         Stopping of antibiotics               encouraged.        ----------------------------     ------------------------------           PCT level decrease by              PCT >= 0.25 ng/mL           < 80% from peak PCT            AND PCT >= 0.5 ng/mL            Continuing antibiotics                                                  encouraged.           Continuing antibiotics                encouraged.        ----------------------------     ------------------------------         PCT level increase compared          PCT > 0.5 ng/mL              with peak PCT AND              PCT >= 0.5 ng/mL             Escalation of antibiotics                                              strongly encouraged.          Escalation of antibiotics            strongly encouraged.  ABO/RH     Status: None   Collection Time    10/07/13  7:29 PM      Result Value Ref Range   ABO/RH(D) B POS    CULTURE, RESPIRATORY (NON-EXPECTORATED)     Status: None   Collection Time    10/07/13 10:10 PM      Result Value Ref Range   Specimen Description TRACHEAL ASPIRATE     Special Requests NONE     Gram Stain       Value: FEW WBC PRESENT,BOTH PMN AND MONONUCLEAR     RARE SQUAMOUS EPITHELIAL CELLS PRESENT     RARE YEAST     Performed at [facility]   Culture PENDING     Report Status PENDING    GLUCOSE, CAPILLARY     Status: Abnormal   Collection Time    10/07/13 11:12 PM      Result Value Ref Range   Glucose-Capillary 223 (*) 70 - 99 mg/dL   Comment 1 Documented in Chart     Comment 2 Notify RN    LACTIC ACID, PLASMA     Status: None   Collection Time    10/08/13 12:10 AM      Result Value Ref Range   Lactic Acid, Venous 1.2  0.5 - 2.2 mmol/L  TROPONIN I     Status: Abnormal   Collection Time    10/08/13 12:10 AM  Result Value Ref Range   Troponin I 0.71 (*) <0.30 ng/mL   Comment:            Due to the release kinetics of cTnI,     a negative result within the first hours     of the onset of symptoms does not rule out     myocardial infarction with certainty.     If myocardial infarction is still suspected,     repeat the test at appropriate intervals.     CRITICAL VALUE NOTED.  VALUE IS CONSISTENT WITH PREVIOUSLY REPORTED AND CALLED VALUE.  GLUCOSE, CAPILLARY     Status: Abnormal   Collection Time    10/08/13  3:18 AM      Result Value Ref Range   Glucose-Capillary 124 (*) 70 - 99 mg/dL   Comment 1 Documented in Chart     Comment 2 Notify RN    LACTIC ACID, PLASMA     Status: None   Collection Time    10/08/13  6:30 AM       Result Value Ref Range   Lactic Acid, Venous 1.0  0.5 - 2.2 mmol/L  TROPONIN I     Status: Abnormal   Collection Time    10/08/13  6:30 AM      Result Value Ref Range   Troponin I 1.00 (*) <0.30 ng/mL   Comment:            Due to the release kinetics of cTnI,     a negative result within the first hours     of the onset of symptoms does not rule out     myocardial infarction with certainty.     If myocardial infarction is still suspected,     repeat the test at appropriate intervals.     CRITICAL VALUE NOTED.  VALUE IS CONSISTENT WITH PREVIOUSLY REPORTED AND CALLED VALUE.  CBC     Status: Abnormal   Collection Time    10/08/13  6:30 AM      Result Value Ref Range   WBC 19.8 (*) 4.0 - 10.5 K/uL   RBC 4.10 (*) 4.22 - 5.81 MIL/uL   Hemoglobin 10.9 (*) 13.0 - 17.0 g/dL   HCT 33.8 (*) 39.0 - 52.0 %   MCV 82.4  78.0 - 100.0 fL   MCH 26.6  26.0 - 34.0 pg   MCHC 32.2  30.0 - 36.0 g/dL   RDW 17.3 (*) 11.5 - 15.5 %   Platelets 304  150 - 400 K/uL  BASIC METABOLIC PANEL     Status: Abnormal   Collection Time    10/08/13  6:30 AM      Result Value Ref Range   Sodium 132 (*) 137 - 147 mEq/L   Potassium 4.1  3.7 - 5.3 mEq/L   Chloride 98  96 - 112 mEq/L   CO2 19  19 - 32 mEq/L   Glucose, Bld 84  70 - 99 mg/dL   BUN 34 (*) 6 - 23 mg/dL   Creatinine, Ser 1.65 (*) 0.50 - 1.35 mg/dL   Calcium 8.8  8.4 - 10.5 mg/dL   GFR calc non Af Amer 39 (*) >90 mL/min   GFR calc Af Amer 45 (*) >90 mL/min   Comment: (NOTE)     The eGFR has been calculated using the CKD EPI equation.     This calculation has not been validated in all clinical situations.     eGFR's persistently <90 mL/min  signify possible Chronic Kidney     Disease.   Anion gap 15  5 - 15  PROCALCITONIN     Status: None   Collection Time    10/08/13  6:30 AM      Result Value Ref Range   Procalcitonin 7.43     Comment:            Interpretation:     PCT > 2 ng/mL:     Systemic infection (sepsis) is likely,     unless other  causes are known.     (NOTE)             ICU PCT Algorithm               Non ICU PCT Algorithm        ----------------------------     ------------------------------             PCT < 0.25 ng/mL                 PCT < 0.1 ng/mL         Stopping of antibiotics            Stopping of antibiotics           strongly encouraged.               strongly encouraged.        ----------------------------     ------------------------------           PCT level decrease by               PCT < 0.25 ng/mL           >= 80% from peak PCT           OR PCT 0.25 - 0.5 ng/mL          Stopping of antibiotics                                                 encouraged.         Stopping of antibiotics               encouraged.        ----------------------------     ------------------------------           PCT level decrease by              PCT >= 0.25 ng/mL           < 80% from peak PCT            AND PCT >= 0.5 ng/mL            Continuing antibiotics                                                  encouraged.           Continuing antibiotics                encouraged.        ----------------------------     ------------------------------         PCT level increase compared          PCT > 0.5  ng/mL             with peak PCT AND              PCT >= 0.5 ng/mL             Escalation of antibiotics                                              strongly encouraged.          Escalation of antibiotics            strongly encouraged.  GLUCOSE, CAPILLARY     Status: None   Collection Time    10/08/13  7:57 AM      Result Value Ref Range   Glucose-Capillary 77  70 - 99 mg/dL   Comment 1 Notify RN     Comment 2 Documented in Chart    POCT I-STAT 3, ART BLOOD GAS (G3+)     Status: Abnormal   Collection Time    10/08/13  8:25 AM      Result Value Ref Range   pH, Arterial 7.507 (*) 7.350 - 7.450   pCO2 arterial 28.0 (*) 35.0 - 45.0 mmHg   pO2, Arterial 205.0 (*) 80.0 - 100.0 mmHg   Bicarbonate 21.8  20.0 - 24.0  mEq/L   TCO2 23  0 - 100 mmol/L   O2 Saturation 100.0     Acid-Base Excess 1.0  0.0 - 2.0 mmol/L   Patient temperature 103.3 F     Collection site RADIAL, ALLEN'S TEST ACCEPTABLE     Drawn by Operator     Sample type ARTERIAL    GLUCOSE, CAPILLARY     Status: Abnormal   Collection Time    10/08/13 11:39 AM      Result Value Ref Range   Glucose-Capillary 131 (*) 70 - 99 mg/dL   Comment 1 Notify RN     Comment 2 Documented in Chart    URINALYSIS, ROUTINE W REFLEX MICROSCOPIC     Status: Abnormal   Collection Time    10/08/13 11:55 AM      Result Value Ref Range   Color, Urine AMBER (*) YELLOW   Comment: BIOCHEMICALS MAY BE AFFECTED BY COLOR   APPearance TURBID (*) CLEAR   Specific Gravity, Urine 1.027  1.005 - 1.030   pH 5.0  5.0 - 8.0   Glucose, UA NEGATIVE  NEGATIVE mg/dL   Hgb urine dipstick SMALL (*) NEGATIVE   Bilirubin Urine SMALL (*) NEGATIVE   Ketones, ur NEGATIVE  NEGATIVE mg/dL   Protein, ur 30 (*) NEGATIVE mg/dL   Urobilinogen, UA 1.0  0.0 - 1.0 mg/dL   Nitrite NEGATIVE  NEGATIVE   Leukocytes, UA SMALL (*) NEGATIVE  URINE MICROSCOPIC-ADD ON     Status: Abnormal   Collection Time    10/08/13 11:55 AM      Result Value Ref Range   WBC, UA 21-50  <3 WBC/hpf   Bacteria, UA FEW (*) RARE   Casts GRANULAR CAST (*) NEGATIVE   Crystals URIC ACID CRYSTALS (*) NEGATIVE   Urine-Other AMORPHOUS URATES/PHOSPHATES     Comment: MUCOUS PRESENT   Ct Head Wo Contrast  10/06/2013   IMPRESSION: 1. No new intracranial hemorrhage. No evidence of an ischemic infarct. No evidence of hydrocephalus. 2. There has been evolution of the right cerebellar  infarct, which measures smaller and is less dense, although the overall mass-effect is similar. There is significantly less intraventricular hemorrhage.     Dg Chest Port 1 View  10/08/2013   IMPRESSION: 1.  Stable positioning of support apparatus.  No pneumothorax. 2. Minimally improved aeration of lungs suggests resolving atelectasis.      Dg Chest Port 1 View  10/07/2013   IMPRESSION: 1. The tip of the right IJ catheter is in the cavoatrial junction. No pneumothorax identified.      Dg Abd Portable 1v  10/07/2013    IMPRESSION: Nasogastric tube tip projects at GE junction, recommend advancement.  Moderate retained large bowel stool, partially imaged.      PHYSICAL EXAM  Temp:  [97.5 F (36.4 C)-103.3 F (39.6 C)] 100.9 F (38.3 C) (09/02 0900) Pulse Rate:  [54-124] 78 (09/02 1030) Resp:  [14-30] 14 (09/02 1030) BP: (61-185)/(40-72) 95/51 mmHg (09/02 1030) SpO2:  [97 %-100 %] 100 % (09/02 1030) Arterial Line BP: (83-233)/(39-123) 89/42 mmHg (09/02 1030) FiO2 (%):  [40 %-100 %] 40 % (09/02 0735) Weight:  [160 lb 4.4 oz (72.7 kg)-171 lb 8.3 oz (77.8 kg)] 160 lb 4.4 oz (72.7 kg) (09/02 0316)  General - Well nourished, well developed, intubated on fentanyl drip.  Ophthalmologic - not able to see through.  Cardiovascular - Regular rhythm with no murmur, but tachycardia.  Neuro - intubated on sedation, able to open eyes on calling his name, not able to follow commands. Still intubated, pupil left 2.26m reactive to light, right irregular sharp due to post surgical pupil 564mdiameter, sluggish to light reaction, facial symmetrical, blink to visual threat bilaterally. Spontaneously moving both LEs but not UEs, reflex decreased throughout, no babinski.    ASSESSMENT/PLAN Mr. TeBANKS CHAIKINs a [age].o. male with hx of R cerebellar ICH, d/c to CIR. Presenting there with fever and lethargy, concerning for sepsis. Stable from the stroke standpoint, with hemorrhage evolution. Concerned for etiology of sepsis and septic shock.   Sepsis and septic shock - fever and elevated WBC with high neutrophils - elevated procalcitronin - source not clear so far - UA negative 8/31 but 21-50 WBC on 9/2, urine culture pending - blood culture pending - CXR and bronch not much concern for pneumonia or aspiration. - recommend abdominal [place] rule out cholecystitis. - pt had fever and AMS, meningitis / encephalitis are in ddx. CT not revealing. Not able to do LP due to recent cerebellar hemorrhage and risk of herniation. Will recommend empiric treatment of bacterial meningitis and follow up with blood culture.  Respiratory failure - intubated  - manage as per CCM  AMS - likely due to sepsis and septic shock - will do EEG to rule out seizure and NCSE.   right cerebellar hemorrhage with IVH last admission secondary to hypertension  CT head showed evolving hemorrahge  No hydrocephalus.  Seems not the reason for this admission  Continue monitoring  CAD - Coronary artery disease with cardiac arrest July 2015 & stent placement x 2. Placed on aspirin and plavix at that time. plavix on hold due to hemorrhage last admission - cardiology recommended to resume plavix whenever appropriate - troponin elevated  - need to close monitoring troponin - series EKGs  - from neuro standpoint, OK to restart plavix for CAD s/p stent if not contraindicated - pt had 13 beats run of Vt at 15:31pm. Revered back SR spontaneously. Recommend cardiology follow up.    Hypothyroid  On synthroid  TSH 4.173  Hospital day # 1  This patient is critically ill due to septic shock, CAD s/p stent not on antiplatelet with elevated troponin, recent cerebellar hemorrhage as well as respiratory failure. He is at significant risk of neurological worsening, death form sepsis, MI, recurrent cerebral hemorrhage and respiratory failure. This patient's care requires constant monitoring of vital signs, hemodynamics, respiratory and cardiac monitoring, review of multiple databases, neurological assessment, discussion with family, other specialists and medical decision making of high complexity. I spent 55 minutes of neurocritical care time in the care of this patient.  Rosalin Hawking, MD PhD Stroke Neurology 10/08/2013 3:10 PM  To contact Stroke Continuity  provider, please refer to [email or web address] After hours, contact General Neurology

## 2013-10-08 NOTE — Progress Notes (Signed)
INITIAL NUTRITION ASSESSMENT  DOCUMENTATION CODES Per approved criteria  -Severe malnutrition in the context of acute illness  Pt meets criteria for severe MALNUTRITION in the context of acute illness as evidenced by moderate subcutaneous fat and severe muscle depletion.  INTERVENTION:  Initiate Vital 1.5 @ 15 ml/hr via OGT and increase by 10 ml every 12 hours to goal rate of 55 ml/hr.    30 ml Prostat BID.     Tube feeding regimen provides 2180 kcal (100% of needs), 119 grams of protein, and 1009 ml of H2O.   Monitor magnesium, potassium, and phosphorus daily for at least 3 days, MD to replete as needed, as pt is at risk for refeeding syndrome given prolonged fasting during hospital stay and severe malnutrition.  NUTRITION DIAGNOSIS: Inadequate oral intake related to inability to eat as evidenced by NPO status.  Goal: Pt to meet >/= 90% of their estimated nutrition needs   Monitor:  TF regimen & tolerance, respiratory status, weight, labs, I/O's  Reason for Assessment: Consult for Enteral/tube feeding initiation and management  Admitting Dx: fever  ASSESSMENT: 78 year old male with recent cardiac arrest 2nd to MI, and cerebellar hemorrhage. In inpatient rehab, he developed fevers and lethargy. 9/1 readmitted to NICU for presumed septic shock, resp failure, fever 103.  Patient is currently intubated on ventilator support d/t presumed septic shock and resp failure MV: 9.7 L/min Temp (24hrs), Avg:99.8 F (37.7 C), Min:97.5 F (36.4 C), Max:103.3 F (39.6 C)  Nutrition Focused Physical Exam:  Subcutaneous Fat:  Orbital Region: WNL Upper Arm Region: moderate depletion Thoracic and Lumbar Region: NA  Muscle:  Temple Region: moderate depletion Clavicle Bone Region: severe depletion Clavicle and Acromion Bone Region: severe depletion Scapular Bone Region: severe depletion Dorsal Hand: WNL Patellar Region: mild depletion Anterior Thigh Region: moderate  depletion Posterior Calf Region: moderate depletion  Edema: no LE edema  Labs reviewed:  Low Na High BUN & Creatinine   Height: Ht Readings from Last 1 Encounters:  10/07/13 6' (1.829 m)    Weight: Wt Readings from Last 1 Encounters:  10/08/13 160 lb 4.4 oz (72.7 kg)    Ideal Body Weight: 178 lb  % Ideal Body Weight: 90%  Wt Readings from Last 10 Encounters:  10/08/13 160 lb 4.4 oz (72.7 kg)  10/01/13 171 lb 8 oz (77.792 kg)  09/25/13 175 lb (79.379 kg)  09/19/13 174 lb (78.926 kg)  09/10/13 177 lb 7.5 oz (80.5 kg)  07/16/13 185 lb (83.915 kg)  05/13/13 191 lb (86.637 kg)  12/10/12 193 lb (87.544 kg)  05/27/12 201 lb (91.173 kg)  04/05/12 207 lb (93.895 kg)    Usual Body Weight: variable  % Usual Body Weight: NA  BMI:  Body mass index is 21.73 kg/(m^2).  Estimated Nutritional Needs: Kcal: 2137 Protein: 115-125g Fluid: 2.1L/ day  Skin: scratches, ecchymosis  Diet Order: NPO  EDUCATION NEEDS: -No education needs identified at this time   Intake/Output Summary (Last 24 hours) at 10/08/13 0904 Last data filed at 10/08/13 0700  Gross per 24 hour  Intake 2793.75 ml  Output    750 ml  Net 2043.75 ml    Last BM: 9/1   Labs:   Recent Labs Lab 10/07/13 1834 10/07/13 1929 10/08/13 0630  NA 134* 131* 132*  K 4.4 4.4 4.1  CL 97 96 98  CO2 20 16* 19  BUN 34* 34* 34*  CREATININE 2.26* 2.15* 1.65*  CALCIUM 9.2 8.9 8.8  GLUCOSE 164* 206* 84  CBG (last 3)   Recent Labs  10/07/13 2312 10/08/13 0318 10/08/13 0757  GLUCAP 223* 124* 77    Scheduled Meds: . antiseptic oral rinse  7 mL Mouth Rinse QID  . atorvastatin  80 mg Oral QPM  . chlorhexidine  15 mL Mouth Rinse BID  . feeding supplement (VITAL HIGH PROTEIN)  1,000 mL Per Tube Q24H  . heparin  5,000 Units Subcutaneous 3 times per day  . insulin aspart  2-6 Units Subcutaneous 6 times per day  . levothyroxine  175 mcg Oral QAC breakfast  . pantoprazole (PROTONIX) IV  40 mg  Intravenous Q24H  . phytonadione (VITAMIN K) IV  2 mg Intravenous Once  . piperacillin-tazobactam (ZOSYN)  IV  3.375 g Intravenous Q8H  . vancomycin  750 mg Intravenous Q12H    Continuous Infusions: . sodium chloride 125 mL/hr at 10/08/13 0338  . fentaNYL infusion INTRAVENOUS 50 mcg/hr (10/07/13 2015)  . norepinephrine (LEVOPHED) Adult infusion Stopped (10/08/13 1740)    Past Medical History  Diagnosis Date  . Lumbar stenosis     L5  . Hypothyroidism   . Hyperlipidemia   . Hypertension   . ED (erectile dysfunction)   . Internal carotid artery stenosis 5/15    bilateral, 40-59%  . Myocardial infarction 08/2013  . Heart murmur   . Stroke ~ 2011    "slight; no permanent damage"    Past Surgical History  Procedure Laterality Date  . Tonsillectomy and adenoidectomy    . Appendectomy    . Coronary angioplasty with stent placement  08/2013    "2"  . Cataract extraction, bilateral Bilateral ~ 2012    Clayton Bibles, [place], [place] Licensed Dietitian Nutritionist Pager: [phone]

## 2013-10-09 ENCOUNTER — Encounter: Payer: Medicare HMO | Admitting: Cardiology

## 2013-10-09 ENCOUNTER — Inpatient Hospital Stay (HOSPITAL_COMMUNITY): Payer: Medicare HMO

## 2013-10-09 DIAGNOSIS — I248 Other forms of acute ischemic heart disease: Secondary | ICD-10-CM

## 2013-10-09 DIAGNOSIS — I4729 Other ventricular tachycardia: Secondary | ICD-10-CM

## 2013-10-09 DIAGNOSIS — Z8674 Personal history of sudden cardiac arrest: Secondary | ICD-10-CM

## 2013-10-09 DIAGNOSIS — I509 Heart failure, unspecified: Secondary | ICD-10-CM

## 2013-10-09 DIAGNOSIS — I472 Ventricular tachycardia: Secondary | ICD-10-CM

## 2013-10-09 LAB — GLUCOSE, CAPILLARY
GLUCOSE-CAPILLARY: 114 mg/dL — AB (ref 70–99)
GLUCOSE-CAPILLARY: 105 mg/dL — AB (ref 70–99)
GLUCOSE-CAPILLARY: 95 mg/dL (ref 70–99)
Glucose-Capillary: 108 mg/dL — ABNORMAL HIGH (ref 70–99)
Glucose-Capillary: 122 mg/dL — ABNORMAL HIGH (ref 70–99)
Glucose-Capillary: 98 mg/dL (ref 70–99)

## 2013-10-09 LAB — POCT I-STAT 3, ART BLOOD GAS (G3+)
ACID-BASE DEFICIT: 4 mmol/L — AB (ref 0.0–2.0)
Bicarbonate: 20.5 mEq/L (ref 20.0–24.0)
O2 Saturation: 99 %
PH ART: 7.407 (ref 7.350–7.450)
TCO2: 21 mmol/L (ref 0–100)
pCO2 arterial: 32.3 mmHg — ABNORMAL LOW (ref 35.0–45.0)
pO2, Arterial: 126 mmHg — ABNORMAL HIGH (ref 80.0–100.0)

## 2013-10-09 LAB — CBC WITH DIFFERENTIAL/PLATELET
BASOS PCT: 0 % (ref 0–1)
Basophils Absolute: 0 10*3/uL (ref 0.0–0.1)
Eosinophils Absolute: 0.1 10*3/uL (ref 0.0–0.7)
Eosinophils Relative: 1 % (ref 0–5)
HEMATOCRIT: 31.5 % — AB (ref 39.0–52.0)
Hemoglobin: 9.9 g/dL — ABNORMAL LOW (ref 13.0–17.0)
LYMPHS ABS: 0.7 10*3/uL (ref 0.7–4.0)
Lymphocytes Relative: 6 % — ABNORMAL LOW (ref 12–46)
MCH: 27 pg (ref 26.0–34.0)
MCHC: 31.4 g/dL (ref 30.0–36.0)
MCV: 86.1 fL (ref 78.0–100.0)
MONO ABS: 0.3 10*3/uL (ref 0.1–1.0)
MONOS PCT: 3 % (ref 3–12)
NEUTROS ABS: 10.1 10*3/uL — AB (ref 1.7–7.7)
Neutrophils Relative %: 90 % — ABNORMAL HIGH (ref 43–77)
Platelets: 228 10*3/uL (ref 150–400)
RBC: 3.66 MIL/uL — AB (ref 4.22–5.81)
RDW: 17.7 % — ABNORMAL HIGH (ref 11.5–15.5)
WBC: 11.2 10*3/uL — ABNORMAL HIGH (ref 4.0–10.5)

## 2013-10-09 LAB — COMPREHENSIVE METABOLIC PANEL
ALT: 18 U/L (ref 0–53)
AST: 24 U/L (ref 0–37)
Albumin: 1.9 g/dL — ABNORMAL LOW (ref 3.5–5.2)
Alkaline Phosphatase: 54 U/L (ref 39–117)
Anion gap: 13 (ref 5–15)
BUN: 26 mg/dL — AB (ref 6–23)
CALCIUM: 8.1 mg/dL — AB (ref 8.4–10.5)
CO2: 19 mEq/L (ref 19–32)
Chloride: 102 mEq/L (ref 96–112)
Creatinine, Ser: 1.18 mg/dL (ref 0.50–1.35)
GFR calc non Af Amer: 58 mL/min — ABNORMAL LOW (ref >90)
GFR, EST AFRICAN AMERICAN: 67 mL/min — AB (ref >90)
GLUCOSE: 127 mg/dL — AB (ref 70–99)
Potassium: 4.1 mEq/L (ref 3.7–5.3)
Sodium: 134 mEq/L — ABNORMAL LOW (ref 137–147)
TOTAL PROTEIN: 6.4 g/dL (ref 6.0–8.3)
Total Bilirubin: 0.3 mg/dL (ref 0.3–1.2)

## 2013-10-09 LAB — BLOOD GAS, ARTERIAL
Acid-base deficit: 3.1 mmol/L — ABNORMAL HIGH (ref 0.0–2.0)
Bicarbonate: 20.4 mEq/L (ref 20.0–24.0)
Drawn by: 41977
FIO2: 0.4 %
MECHVT: 620 mL
O2 SAT: 99.3 %
PATIENT TEMPERATURE: 98.6
PEEP: 5 cmH2O
PO2 ART: 177 mmHg — AB (ref 80.0–100.0)
RATE: 13 resp/min
TCO2: 21.3 mmol/L (ref 0–100)
pCO2 arterial: 30.8 mmHg — ABNORMAL LOW (ref 35.0–45.0)
pH, Arterial: 7.437 (ref 7.350–7.450)

## 2013-10-09 LAB — PROTIME-INR
INR: 1.49 (ref 0.00–1.49)
PROTHROMBIN TIME: 18 s — AB (ref 11.6–15.2)

## 2013-10-09 LAB — TROPONIN I
TROPONIN I: 1.41 ng/mL — AB (ref <0.30)
Troponin I: 1.77 ng/mL (ref <0.30)
Troponin I: 1.91 ng/mL (ref <0.30)

## 2013-10-09 LAB — CK TOTAL AND CKMB (NOT AT ARMC)
CK, MB: 6.4 ng/mL — AB (ref 0.3–4.0)
CK, MB: 7.1 ng/mL — AB (ref 0.3–4.0)
RELATIVE INDEX: INVALID (ref 0.0–2.5)
Relative Index: INVALID (ref 0.0–2.5)
Total CK: 52 U/L (ref 7–232)
Total CK: 49 U/L (ref 7–232)

## 2013-10-09 LAB — APTT: aPTT: 33 seconds (ref 24–37)

## 2013-10-09 LAB — PROCALCITONIN: PROCALCITONIN: 4.88 ng/mL

## 2013-10-09 MED ORDER — IOHEXOL 300 MG/ML  SOLN
100.0000 mL | Freq: Once | INTRAMUSCULAR | Status: AC | PRN
  Administered 2013-10-09: 100 mL via INTRAVENOUS

## 2013-10-09 MED ORDER — ASPIRIN 81 MG PO CHEW
81.0000 mg | CHEWABLE_TABLET | Freq: Every day | ORAL | Status: DC
  Administered 2013-10-09 – 2013-10-11 (×3): 81 mg via ORAL
  Filled 2013-10-09 (×3): qty 1

## 2013-10-09 MED ORDER — NITROGLYCERIN 0.4 MG SL SUBL
0.4000 mg | SUBLINGUAL_TABLET | SUBLINGUAL | Status: DC | PRN
Start: 2013-10-09 — End: 2013-10-21

## 2013-10-09 MED ORDER — DEXTROSE 5 % IV SOLN
1.0000 g | Freq: Three times a day (TID) | INTRAVENOUS | Status: AC
  Administered 2013-10-09 – 2013-10-13 (×13): 1 g via INTRAVENOUS
  Filled 2013-10-09 (×14): qty 1

## 2013-10-09 MED ORDER — ISOSORBIDE MONONITRATE ER 30 MG PO TB24
30.0000 mg | ORAL_TABLET | Freq: Every day | ORAL | Status: DC
  Administered 2013-10-09 – 2013-10-11 (×2): 30 mg via ORAL
  Filled 2013-10-09 (×3): qty 1

## 2013-10-09 MED ORDER — METOPROLOL TARTRATE 12.5 MG HALF TABLET
12.5000 mg | ORAL_TABLET | Freq: Two times a day (BID) | ORAL | Status: DC
  Administered 2013-10-09 – 2013-10-11 (×3): 12.5 mg via ORAL
  Filled 2013-10-09 (×5): qty 1

## 2013-10-09 MED ORDER — IOHEXOL 300 MG/ML  SOLN
25.0000 mL | INTRAMUSCULAR | Status: AC
  Administered 2013-10-09 (×2): 25 mL via ORAL

## 2013-10-09 NOTE — Progress Notes (Addendum)
STROKE TEAM PROGRESS NOTE   HISTORY HPI: Taylor Keller is an 78 y.o. male just recently discharged to rehab with right cerebellar hemorrhage. As per wife, he apparently doing well in rehab. However, developed low grade fever and bouts of hypotension, as well as increased lethargy and somnolence. Yesterday showed marked elevated WBC count and suspect sepsis. He was transferred to [facility] and admitted by [facility] team. Repeat CT head showed evolution of right cerebellar hemorrhage with near resolution of IVH and no hydrocephalus. He had hypotension after admission and was put on pressors. He also became apnea during bronchoscopy, and he was intubated. Bronch did not show aspiration or pneumonia. UA was negative also. Blood culture pending. Neurology was consulted for AMS. Stroke is not the reason leading to this admission.  SUBJECTIVE (INTERVAL HISTORY) His wife is at the bedside.  Overall he feels his condition is much improve. He is awake alert follow simple commands although still intubated. Afebrile now. Will extubate later today. 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 Pt troponin elevated, cardiology on board again.   OBJECTIVE Temp:  [96.9 F (36.1 C)-100.5 F (38.1 C)] 99.8 F (37.7 C) (09/03 1955) Pulse Rate:  [77-114] 106 (09/03 1900) Cardiac Rhythm:  [-] Normal sinus rhythm (09/03 0800) Resp:  [11-28] 28 (09/03 1900) BP: (105-148)/(53-70) 131/62 mmHg (09/03 1900) SpO2:  [98 %-100 %] 98 % (09/03 1900) Arterial Line BP: (70-185)/(47-128) 162/62 mmHg (09/03 1100) FiO2 (%):  [30 %-40 %] 30 % (09/03 0750) Weight:  [165 lb 12.6 oz (75.2 kg)] 165 lb 12.6 oz (75.2 kg) (09/03 0332)   Recent Labs Lab 10/09/13 0330 10/09/13 0757 10/09/13 1152 10/09/13 1536 10/09/13 1950  GLUCAP 122* 114* 108* 105* 95    Recent Labs Lab 10/06/13 0954 10/07/13 1834 10/07/13 1929 10/08/13 0630 10/09/13 0500  NA 130* 134* 131* 132*  134*  K 4.6 4.4 4.4 4.1 4.1  CL 93* 97 96 98 102  CO2 24 20 16* 19 19  GLUCOSE 115* 164* 206* 84 127*  BUN 19 34* 34* 34* 26*  CREATININE 1.33 2.26* 2.15* 1.65* 1.18  CALCIUM 9.2 9.2 8.9 8.8 8.1*    Recent Labs Lab 10/07/13 1834 10/07/13 1929 10/09/13 0500  AST 21 24 24   ALT 20 19 18   ALKPHOS 59 60  60 54  BILITOT 0.4 0.5 0.3  PROT 7.7 7.6 6.4  ALBUMIN 2.6* 2.6* 1.9*    Recent Labs Lab 10/06/13 0954 10/07/13 1834 10/07/13 1929 10/08/13 0630 10/09/13 0500  WBC 9.4 20.5* 19.1* 19.8* 11.2*  NEUTROABS 8.0* 18.9*  --   --  10.1*  HGB 11.8* 11.0* 10.9* 10.9* 9.9*  HCT 37.0* 34.6* 33.7* 33.8* 31.5*  MCV 83.5 85.6 83.0 82.4 86.1  PLT 353 394 362 304 228    Recent Labs Lab 10/08/13 0010 10/08/13 0630 10/09/13 0955 10/09/13 1244 10/09/13 1940  CKTOTAL  --   --   --  52 49  CKMB  --   --   --  6.4* 7.1*  TROPONINI 0.71* 1.00* 1.41* 1.77* 1.91*    Recent Labs  10/07/13 1929 10/09/13 0500  LABPROT 19.2* 18.0*  INR 1.62* 1.49    Recent Labs  10/08/13 1155  COLORURINE AMBER*  LABSPEC 1.027  PHURINE 5.0  GLUCOSEU NEGATIVE  HGBUR SMALL*  BILIRUBINUR SMALL*  KETONESUR NEGATIVE  PROTEINUR 30*  UROBILINOGEN 1.0  NITRITE NEGATIVE  LEUKOCYTESUR SMALL*       Component Value Date/Time  CHOL 99 05/13/2013 0833   TRIG 48 05/13/2013 0833   HDL 38* 05/13/2013 0833   CHOLHDL 2.6 05/13/2013 0833   VLDL 10 05/13/2013 0833   LDLCALC 51 05/13/2013 0833   Lab Results  Component Value Date   HGBA1C 6.0* 09/25/2013      Component Value Date/Time   LABOPIA NONE DETECTED 09/25/2013 0312   LABOPIA NEGATIVE 08/18/2008 0400   COCAINSCRNUR NONE DETECTED 09/25/2013 0312   COCAINSCRNUR NEGATIVE 08/18/2008 0400   LABBENZ NONE DETECTED 09/25/2013 0312   LABBENZ NEGATIVE 08/18/2008 0400   AMPHETMU NONE DETECTED 09/25/2013 0312   AMPHETMU NEGATIVE 08/18/2008 0400   THCU NONE DETECTED 09/25/2013 0312   LABBARB NONE DETECTED 09/25/2013 0312    No results found for this basename: ETH,  in  the last 168 hours  Ct Chest Wo Contrast   10/09/2013   Endotracheal and nasogastric tubes are in grossly good position.  Minimal bilateral posterior basilar subsegmental atelectasis is noted. Also noted is minimal ill-defined opacity in the right lower lobe which may represent focal atelectasis or possibly early pneumonia. Followup radiographs are recommended.     US Abdomen Complete  10/08/2013   IMPRESSION: 1. Multiple gallstones. 2. 4.2 cm right renal mass. This is suspicious for renal cell carcinoma. 3. Two large solid retroperitoneal mass lesions, the largest measures 9.6 cm in diameter. Metastatic disease, primary retroperitoneal tumor, and lymphoma could present in this fashion. Contrast -enhanced CT of the abdomen and pelvis is suggested for further evaluation of these findings.     Ct Abdomen Pelvis W Contrast  10/09/2013   7.4 cm enhancing mass is seen involving the right kidney consistent with renal cell carcinoma. It may evolve the renal vein in the hilum, but does not appear to extend significantly centrally. 3.7 cm right adrenal mass is noted consistent with metastatic disease. Large multiloculated mass is noted in the right retroperitoneal region measuring 10 x 5.9 cm consistent with metastatic disease.     Dg Chest Port 1 View  10/09/2013   IMPRESSION: There is stable positioning of the support tubes and lines. There has been slight interval improvement in the appearance of the pulmonary interstitium since yesterday's study.   Electronically Signed   By: David  [place]   On: 10/09/2013 07:48   Ct Head Wo Contrast  10/06/2013 IMPRESSION: 1. No new intracranial hemorrhage. No evidence of an ischemic infarct. No evidence of hydrocephalus. 2. There has been evolution of the right cerebellar infarct, which measures smaller and is less dense, although the overall mass-effect is similar. There is significantly less intraventricular hemorrhage.    PHYSICAL EXAM  Physical exam  Temp:  [96.9  F (36.1 C)-100.5 F (38.1 C)] 99.8 F (37.7 C) (09/03 1955) Pulse Rate:  [77-114] 106 (09/03 1900) Resp:  [11-28] 28 (09/03 1900) BP: (105-148)/(53-70) 131/62 mmHg (09/03 1900) SpO2:  [98 %-100 %] 98 % (09/03 1900) Arterial Line BP: (70-185)/(47-128) 162/62 mmHg (09/03 1100) FiO2 (%):  [30 %-40 %] 30 % (09/03 0750) Weight:  [165 lb 12.6 oz (75.2 kg)] 165 lb 12.6 oz (75.2 kg) (09/03 0332)  General - Well nourished, well developed, intubated, but awake alert following simple commands.  Ophthalmologic - not able to see through.  Cardiovascular - Regular rate and rhythm with no murmur.  Neuro - intubated but awake alert following simple commands, pupil left 2.84m reactive to light, right irregular sharp due to post surgical pupil 583mdiameter, sluggish to light reaction, facial seems symmetrical, blink to visual threat bilaterally.  Spontaneously moving both LEs and UEs, symmetrically, reflex decreased throughout, no babinski.    ASSESSMENT/PLAN Mr. Taylor Keller is a 78 y.o. male with hx of R cerebellar ICH, d/c to CIR. Presenting there with fever and lethargy, concerning for sepsis. Stable from the stroke standpoint, with hemorrhage evolution. Concerned for etiology of sepsis and septic shock.   Sepsis and septic shock, improved - BP under control and off pressor - fever resolved - WBC trending down nicely  - elevated procalcitronin indicating sepsis - source not clear so far  - UA negative 8/31 but 21-50 WBC on 9/2, urine culture NGTD  - blood culture NGTD  - CXR and bronch not much concern for pneumonia or aspiration.   - continue empiric treatment of bacterial meningitis and follow up with blood culture.   Respiratory failure  - intubated  - CT chest unremarkable, will extubate later today - manage as per CCM   AMS  - resolved today - ikely due to sepsis and septic shock  - EEG ruled out seizure and NCSE.   right cerebellar hemorrhage with IVH last admission secondary  to hypertension  CT head showed evolving hemorrahge  No hydrocephalus.  Seems not the reason for this admission  Continue monitoring  CAD  - Coronary artery disease with cardiac arrest July 2015 & stent placement x 2. Placed on aspirin and plavix at that time. plavix on hold due to hemorrhage last admission  - cardiology recommended to resume plavix whenever appropriate  - troponin elevated, continue to trend - cardiology on board, do not think NSTEMI  - series EKGs  - from neuro standpoint, OK to restart plavix for CAD s/p stent if not contraindicated   Renal cell carcinoma with met - confirmed with [place] and CT  - likely mets to adrenal gland and retroperitoneal - may need oncology consult soon  Hypothyroid  On synthroid  TSH 4.173  Hospital day # 2  This patient is critically ill due to septic shock, CAD s/p stent not on antiplatelet with elevated troponin, recent cerebellar hemorrhage as well as respiratory failure. He is at significant risk of neurological worsening, death form sepsis, MI, recurrent cerebral hemorrhage and respiratory failure. This patient's care requires constant monitoring of vital signs, hemodynamics, respiratory and cardiac monitoring, review of multiple databases, neurological assessment, discussion with family, other specialists and medical decision making of high complexity. I spent 45 minutes of neurocritical care time in the care of this patient.  Rosalin Hawking, MD PhD Stroke Neurology 10/09/2013 10:06 PM  To contact Stroke Continuity provider, please refer to [email or web address] After hours, contact General Neurology

## 2013-10-09 NOTE — Progress Notes (Signed)
    Subjective:  Intubated. Alert. 13 beats NSVT yesterday. No CP.  Objective:  Vital Signs in the last 24 hours: Temp:  [97.1 F (36.2 C)-100.5 F (38.1 C)] 97.5 F (36.4 C) (09/03 0758) Pulse Rate:  [66-114] 85 (09/03 0750) Resp:  [11-41] 11 (09/03 0800) BP: (95-149)/(51-71) 111/56 mmHg (09/03 0800) SpO2:  [99 %-100 %] 100 % (09/03 0800) Arterial Line BP: (89-203)/(42-82) 127/51 mmHg (09/03 0800) FiO2 (%):  [30 %-40 %] 30 % (09/03 0750) Weight:  [165 lb 12.6 oz (75.2 kg)] 165 lb 12.6 oz (75.2 kg) (09/03 0332)  Intake/Output from previous day: 09/02 0701 - 09/03 0700 In: 4206.2 [I.V.:2905.9; NG/GT:350.3; IV Piggyback:450] Out: 1250 [Urine:1250]   Physical Exam: General: Well developed, well nourished, in no acute distress, intubated eyes open, alert.  Head:  Normocephalic and atraumatic. Lungs: Clear to auscultation and percussion. Vent noise. Heart: Normal S1 and S2.  No murmur, rubs or gallops.  Abdomen: soft, non-tender, positive bowel sounds. Extremities: No clubbing or cyanosis. No edema. Neurologic: Alert.    Lab Results:  Recent Labs  10/08/13 0630 10/09/13 0500  WBC 19.8* 11.2*  HGB 10.9* 9.9*  PLT 304 228    Recent Labs  10/08/13 0630 10/09/13 0500  NA 132* 134*  K 4.1 4.1  CL 98 102  CO2 19 19  GLUCOSE 84 127*  BUN 34* 26*  CREATININE 1.65* 1.18    Recent Labs  10/08/13 0010 10/08/13 0630  TROPONINI 0.71* 1.00*   Hepatic Function Panel  Recent Labs  10/09/13 0500  PROT 6.4  ALBUMIN 1.9*  AST 24  ALT 18  ALKPHOS 54  BILITOT 0.3     Telemetry: 12 beats NSVT yesterday at 3pm Personally viewed.   Assessment/Plan:  Active Problems:   Acute respiratory failure with hypoxia   Septic shock(785.52)   Septic shock  78 year old with DES placed 08/25/13 following cardiac arrest [facility]) normal EF with recent [place] now in acute care setting with sepsis, fever, abdominal mass?, with acute respiratory failure.   1) CAD -  in review of Dr. Erlinda Hong note from 10/08/13, OK to restart Plavix. However with his abdominal findings on ultrasound and possible need for biopsy, I would recommend starting the Plavix 75mg  QD once this situation is better understood. Wife understands. Discussed with Dr. Titus Mould.   2) NSVT - restart beta blocker (metoprolol) once able from blood pressure perspective. Recent hypotension, sepsis precludes use currently. Repeat ECHO pending. Prior EF normal post arrest.   3) Demand ischemia - mildly elevated troponin in the setting of sepsis. Not likely ACS. No CP.   Please let us know if we can be of further assistance.       Darol Cush, [place] 10/09/2013, 10:16 AM

## 2013-10-09 NOTE — Evaluation (Signed)
Clinical/Bedside Swallow Evaluation Patient Details  Name: Taylor Keller MRN: [id number] Date of Birth: 1935-08-29  Today's Date: 10/09/2013 Time: 1640-1700 SLP Time Calculation (min): 20 min  Past Medical History:  Past Medical History  Diagnosis Date  . Lumbar stenosis     L5  . Hypothyroidism   . Hyperlipidemia   . Hypertension   . ED (erectile dysfunction)   . Internal carotid artery stenosis 5/15    bilateral, 40-59%  . Myocardial infarction 08/2013  . Heart murmur   . Stroke ~ 2011    "slight; no permanent damage"   Past Surgical History:  Past Surgical History  Procedure Laterality Date  . Tonsillectomy and adenoidectomy    . Appendectomy    . Coronary angioplasty with stent placement  08/2013    "2"  . Cataract extraction, bilateral Bilateral ~ 2012   HPI:  78 yo M with cerebellar hemorrhage. MRI shows 2.6 x 1.9 cm hematoma in the inferior right cerebellum. Pt also has a history of recent MI and cardiac arrest, intubated 7 days, who was discharged from [facility] on 7/31,  Pt was d/c'd home on Honey thick liquids. Readmitted to [facility] with CHF.  Found to have dysphagia with silent aspiration s/p extubation.but underwent an MBS on 8/3 showing mild sensori-motor pharyngeal dysphagia, characterized by decreased base of tongue contraction to the pharyngeal wall and decreased laryngeal elevation and epiglottic deflection. Pt recommended to consume a dys 3 diet with thin liquids with a chin tuck and second swallow to clear residue. Pt was progressing well on CIR until he became septic and was transferred back to acute care with two-day intubation. SLP was ordered to assess readiness to restart PO diet.   Assessment / Plan / Recommendation Clinical Impression  Pt demonstrates oral holding with suspected delayed swallow initiation and decreased hyolaryngeal movement, leading to immediate coughing with thin liquids. Trials of pureed solids yield no less than 4  swallows per bolus with immediate and delayed throat clearing. Pt's vocal intensity is very diminished with mild hoarseness noted. At this time, recommend to maintian NPO overnight except for a few bites of puree to accept meds PRN. SLP to follow-up on next date to assess readiness for PO diet versus need for objective testing.     Aspiration Risk  Severe    Diet Recommendation NPO except meds   Medication Administration: Crushed with puree    Other  Recommendations Oral Care Recommendations: Oral care Q4 per protocol   Follow Up Recommendations  Inpatient Rehab    Frequency and Duration min 2x/week  2 weeks   Pertinent Vitals/Pain n/a    SLP Swallow Goals     Swallow Study Prior Functional Status       General HPI: 78 yo M with cerebellar hemorrhage. MRI shows 2.6 x 1.9 cm hematoma in the inferior right cerebellum. Pt also has a history of recent MI and cardiac arrest, intubated 7 days, who was discharged from [facility] general hospital on 7/31,  Pt was d/c'd home on Honey thick liquids. Readmitted to [facility] with CHF.  Found to have dysphagia with silent aspiration s/p extubation.but underwent an MBS on 8/3 showing mild sensori-motor pharyngeal dysphagia, characterized by decreased base of tongue contraction to the pharyngeal wall and decreased laryngeal elevation and epiglottic deflection. Pt recommended to consume a dys 3 diet with thin liquids with a chin tuck and second swallow to clear residue. Pt was progressing well on CIR until he became septic and was transferred  back to acute care with two-day intubation. SLP was ordered to assess readiness to restart PO diet. Type of Study: Bedside swallow evaluation Previous Swallow Assessment: BSE on 09/25/13 and recommended Dys. 2 textures with nectar-thick liquids but had since been upgraded to regular textures with thin liquids.  Diet Prior to this Study: NPO Temperature Spikes Noted: No Respiratory Status: Nasal cannula (4L) History of  Recent Intubation: Yes Length of Intubations (days): 2 days Date extubated: 10/09/13 Behavior/Cognition: Alert;Cooperative;Pleasant mood Oral Cavity - Dentition: Adequate natural dentition Self-Feeding Abilities: Needs assist Patient Positioning: Upright in bed Baseline Vocal Quality: Low vocal intensity Volitional Swallow: Able to elicit (with delay)    Oral/Motor/Sensory Function     Ice Chips Ice chips: Impaired Presentation: Spoon Oral Phase Functional Implications: Oral holding Pharyngeal Phase Impairments: Suspected delayed Swallow;Decreased hyoid-laryngeal movement;Throat Clearing - Immediate;Cough - Immediate   Thin Liquid Thin Liquid: Impaired Presentation: Spoon Oral Phase Functional Implications: Oral holding Pharyngeal  Phase Impairments: Suspected delayed Swallow;Decreased hyoid-laryngeal movement;Throat Clearing - Immediate;Cough - Immediate    Nectar Thick Nectar Thick Liquid: Not tested   Honey Thick Honey Thick Liquid: Not tested   Puree Puree: Impaired Presentation: Spoon Oral Phase Functional Implications: Oral holding Pharyngeal Phase Impairments: Suspected delayed Swallow;Decreased hyoid-laryngeal movement;Multiple swallows;Throat Clearing - Immediate;Throat Clearing - Delayed   Solid   GO    Solid: Not tested        [name], M.A. CCC-SLP [phone]  [name] 10/09/2013,5:11 PM

## 2013-10-09 NOTE — Progress Notes (Signed)
PULMONARY / CRITICAL CARE MEDICINE   Name: Taylor Keller MRN: [id number] DOB: [birth year]    ADMISSION DATE:  10/07/2013 CONSULTATION DATE:  10/07/2013  REFERRING MD :  Inpatient rehab  CHIEF COMPLAINT:  fever  INITIAL PRESENTATION: 78 year old male with recent cardiac arrest 2nd to MI, and cerebellar hemorrhage, the latter of which he was discharged for to inpatient rehab 8/25. While there, he developed fevers and lethargy. Medical staff there was concerned for sepsis, PCCM called to readmit.   STUDIES:  CT head 8/31 > No new intracranial hemorrhage. No evidence of an ischemic infarct. No evidence of hydrocephalus. There has been evolution of the right cerebellar infarct, which measures smaller and is less dense, although the overall mass-effect  is similar. There is significantly less intraventricular hemorrhage.  SIGNIFICANT EVENTS: 8/25 - discharged from NICU to inpatient rehab 9/1 readmitted to NICU for presumed septic shock, resp failure, fever 103 9/2- improved pressors 9/2 EEG>>>generalized irregular slow activity 9/2 [place] abdo>>> Multiple gallstones. 4.2 cm right renal mass. This is suspicious for renal cell  carcinoma. Two large solid retroperitoneal mass lesions, the largest  measures 9.6 cm in diameter. Metastatic disease, primary  retroperitoneal tumor, and lymphoma could present in this fashion.   9/3 improved neurostatus 9/3 CT>>>  SUBJECTIVE:no pressors, follows commands  VITAL SIGNS: Temp:  [97.1 F (36.2 C)-100.9 F (38.3 C)] 97.5 F (36.4 C) (09/03 0758) Pulse Rate:  [66-114] 85 (09/03 0750) Resp:  [12-41] 12 (09/03 0750) BP: (80-149)/(47-71) 127/58 mmHg (09/03 0750) SpO2:  [99 %-100 %] 100 % (09/03 0750) Arterial Line BP: (83-203)/(39-82) 108/48 mmHg (09/03 0700) FiO2 (%):  [30 %-40 %] 30 % (09/03 0750) Weight:  [75.2 kg (165 lb 12.6 oz)] 75.2 kg (165 lb 12.6 oz) (09/03 0332) HEMODYNAMICS: CVP:  [2 mmHg-67 mmHg] 6 mmHg VENTILATOR SETTINGS: Vent Mode:   [-] PSV;CPAP FiO2 (%):  [30 %-40 %] 30 % Set Rate:  [13 bmp] 13 bmp Vt Set:  [620 mL] 620 mL PEEP:  [5 cmH20] 5 cmH20 Pressure Support:  [5 cmH20] 5 cmH20 Plateau Pressure:  [12 cmH20-15 cmH20] 12 cmH20 INTAKE / OUTPUT:  Intake/Output Summary (Last 24 hours) at 10/09/13 0809 Last data filed at 10/09/13 0700  Gross per 24 hour  Intake 4036.15 ml  Output   1250 ml  Net 2786.15 ml    PHYSICAL EXAMINATION: General:  Elderly male, rass 0 Neuro:  rass 0, moves lowers ext, perr HEENT:  no JVD noted Cardiovascular:  s1 s2 rrr Lungs:  CTa anterior Abdomen:  Soft, non-distended. None tender Musculoskeletal:  No edema Skin:  Intact  LABS:  CBC  Recent Labs Lab 10/07/13 1929 10/08/13 0630 10/09/13 0500  WBC 19.1* 19.8* 11.2*  HGB 10.9* 10.9* 9.9*  HCT 33.7* 33.8* 31.5*  PLT 362 304 228   Coag's  Recent Labs Lab 10/07/13 1929 10/09/13 0500  APTT 37 33  INR 1.62* 1.49   BMET  Recent Labs Lab 10/07/13 1929 10/08/13 0630 10/09/13 0500  NA 131* 132* 134*  K 4.4 4.1 4.1  CL 96 98 102  CO2 16* 19 19  BUN 34* 34* 26*  CREATININE 2.15* 1.65* 1.18  GLUCOSE 206* 84 127*   Electrolytes  Recent Labs Lab 10/07/13 1929 10/08/13 0630 10/09/13 0500  CALCIUM 8.9 8.8 8.1*   Sepsis Markers  Recent Labs Lab 10/07/13 1834 10/07/13 1929 10/08/13 0010 10/08/13 0630  LATICACIDVEN 1.7  --  1.2 1.0  PROCALCITON  --  8.86  --  7.43  ABG  Recent Labs Lab 10/07/13 1835 10/08/13 0825 10/09/13 0438  PHART 7.412 7.507* 7.437  PCO2ART 39.7 28.0* 30.8*  PO2ART 521.0* 205.0* 177.0*   Liver Enzymes  Recent Labs Lab 10/07/13 1834 10/07/13 1929 10/09/13 0500  AST 21 24 24   ALT 20 19 18   ALKPHOS 59 60  60 54  BILITOT 0.4 0.5 0.3  ALBUMIN 2.6* 2.6* 1.9*   Cardiac Enzymes  Recent Labs Lab 10/07/13 1929 10/08/13 0010 10/08/13 0630  TROPONINI 0.85* 0.71* 1.00*   Glucose  Recent Labs Lab 10/08/13 0757 10/08/13 1139 10/08/13 1552 10/08/13 1923  10/08/13 2316 10/09/13 0330  GLUCAP 77 131* 126* 109* 107* 122*    Imaging US Abdomen Complete  10/08/2013   CLINICAL DATA:  Sepsis.  EXAM: ULTRASOUND ABDOMEN COMPLETE  COMPARISON:  Abdomen 10/07/2013.  FINDINGS: Gallbladder:  Multiple mobile gallstones. Gallbladder wall thickness 3 mm. This is upper limits normal. Negative Murphy sign. No pericholecystic fluid.  Common bile duct:  Diameter: 5 mm  Liver:  No focal lesion identified. Within normal limits in parenchymal echogenicity.  IVC:  No abnormality visualized.  Pancreas:  Visualized portion unremarkable.  Spleen:  Size and appearance within normal limits.  Right Kidney:  Length: 11.6 cm. Echogenicity within normal limits. No hydronephrosis. There appears to be a right mid renal 3.9 x 3.2 x 4.2 cm mass. This is suspicious for renal cell carcinoma. This are associated with large retroperitoneal solid masses. Mass adjacent to the abdominal aorta measures 9.6 cm in maximum diameter, mass adjacent to the IVC measures 5.3 cm in maximum diameter. These could represent metastatic lesions, primary retroperitoneal tumor, and lymphoma. Contrast-enhanced CT of the abdomen pelvis suggested to further evaluate.  Left Kidney:  Length: 12.0 cm. Echogenicity within normal limits. No mass or hydronephrosis visualized.  Abdominal aorta:  No aneurysm visualized.  Other findings:  None.  IMPRESSION: 1. Multiple gallstones. 2. 4.2 cm right renal mass. This is suspicious for renal cell carcinoma. 3. Two large solid retroperitoneal mass lesions, the largest measures 9.6 cm in diameter. Metastatic disease, primary retroperitoneal tumor, and lymphoma could present in this fashion. Contrast -enhanced CT of the abdomen and pelvis is suggested for further evaluation of these findings.   Electronically Signed   By: Marcello Moores  Register   On: 10/08/2013 18:54   Dg Chest Port 1 View  10/08/2013   CLINICAL DATA:  Evaluate endotracheal tube positioning  EXAM: PORTABLE CHEST - 1 VIEW   COMPARISON:  10/07/2013; 10/06/2013; 09/08/2013  FINDINGS: Grossly unchanged cardiac silhouette and mediastinal contours. Stable position of support apparatus. Minimally improved aeration of the lungs. Grossly unchanged bilateral infrahilar heterogeneous opacities, left greater than right. No new focal airspace opacities. No pleural effusion or pneumothorax. No definite evidence of edema. Unchanged bones.  IMPRESSION: 1.  Stable positioning of support apparatus.  No pneumothorax. 2. Minimally improved aeration of lungs suggests resolving atelectasis.   Electronically Signed   By: Sandi Mariscal M.D.   On: 10/08/2013 08:33     ASSESSMENT / PLAN:  PULMONARY OETT 9/1 >>> A: Acute respiratory failure Mild edema P:   ABg wnl, keep same MV on rest Wean cpap5 ps 5, goal 30 min , assess rsbi, and abg and neurostrength pcxr in am  If poor weaning - lasix kvo  CARDIOVASCULAR CVL- RIJ 9/1 >>> A:  Hypovolemia resolved Septic shock- resolved Bradycardia resolved likely stress sichemia  P:  Echo Asa  Restart Echo on order Tele BB when sys tolerates Dc cvp kvo  RENAL A:  AKI - baseline creat 1.1 Hypovolemia, hyponatremia Renal mass P:   kvo today Chem in am  Follow co2 Needs CT  GASTROINTESTINAL A:   NGT irritation- resolved Abdo masses P:   SUP: IV PPI Hold TF CT   HEMATOLOGIC A:   Anemia - chronic Leukocytosis dvt risk coagulapathy ( sepsis?) - resolved  P:  Sub q hep ok as INR less 1.5  INFECTIOUS A:   Septic shock, etiology unclear R/o aspiration event  P:   BCx2 9/1 >>> UC 9/1 >>> Sputum 9/1 >>>candida Abx: Zosyn, start date 9/1>>>9/2 Abx: ceftaz 9/2>>>will consider a full course given nosocomial exposure Abx: Vanc, start date 9/1>>>9/3  ENDOCRINE A:   Hypothyroid No evidence for rel AI P:   CBG monitoring SSI  NEUROLOGIC A:   Acute encephalopathy - secondary to hypotension in setting carotid dz Recent cerebellar bleed without hydro P:    RASS goal: 0 fent eeg done, appreciated neuro follow up wua / sbt Will need pt  Global: wean, abdo mass, will ct, off pressors  I have personally obtained a history, examined the patient, evaluated laboratory and imaging results, formulated the assessment and plan and placed orders. CRITICAL CARE: The patient is critically ill with multiple organ systems failure and requires high complexity decision making for assessment and support, frequent evaluation and titration of therapies, application of advanced monitoring technologies and extensive interpretation of multiple databases. Critical Care Time devoted to patient care services described in this note is 35 m minutes.   Lavon Paganini. Titus Mould, MD, [place] Pgr: [facility]

## 2013-10-09 NOTE — Progress Notes (Signed)
CRITICAL VALUE ALERT  Critical value received:  CKMB, 6.4  Date of notification:  10/09/13  Time of notification:  9833  Critical value read back:Yes.    Nurse who received alert:  Gilford Rile, RN  MD notified (1st page):  Dr. Nadara Mode  Time of first page:  7  MD notified (2nd page):  Time of second page:  Responding MD:  Dr. Nadara Mode  Time MD responded:  [phone]

## 2013-10-09 NOTE — Procedures (Signed)
**Note De-Identified Mareta Chesnut Obfuscation** Extubation Procedure Note  Patient Details:   Name: Taylor Keller DOB: 1935/07/28 MRN: [id number]   Airway Documentation:     Evaluation  O2 sats: stable throughout Complications: No apparent complications Patient did tolerate procedure well. Bilateral Breath Sounds: Clear;Diminished Suctioning: Oral;Airway Yes +leak, no stridor Amor Hyle, Penni Bombard 10/09/2013, 1:34 PM

## 2013-10-10 ENCOUNTER — Inpatient Hospital Stay (HOSPITAL_COMMUNITY): Payer: Medicare HMO

## 2013-10-10 ENCOUNTER — Ambulatory Visit (HOSPITAL_COMMUNITY): Payer: Medicare HMO

## 2013-10-10 DIAGNOSIS — R404 Transient alteration of awareness: Secondary | ICD-10-CM

## 2013-10-10 DIAGNOSIS — I5021 Acute systolic (congestive) heart failure: Secondary | ICD-10-CM

## 2013-10-10 DIAGNOSIS — I059 Rheumatic mitral valve disease, unspecified: Secondary | ICD-10-CM

## 2013-10-10 LAB — COMPREHENSIVE METABOLIC PANEL
ALT: 15 U/L (ref 0–53)
AST: 21 U/L (ref 0–37)
Albumin: 1.9 g/dL — ABNORMAL LOW (ref 3.5–5.2)
Alkaline Phosphatase: 53 U/L (ref 39–117)
Anion gap: 12 (ref 5–15)
BILIRUBIN TOTAL: 0.4 mg/dL (ref 0.3–1.2)
BUN: 22 mg/dL (ref 6–23)
CHLORIDE: 101 meq/L (ref 96–112)
CO2: 20 mEq/L (ref 19–32)
CREATININE: 0.98 mg/dL (ref 0.50–1.35)
Calcium: 8.4 mg/dL (ref 8.4–10.5)
GFR calc non Af Amer: 78 mL/min — ABNORMAL LOW (ref >90)
GLUCOSE: 105 mg/dL — AB (ref 70–99)
Potassium: 3.7 mEq/L (ref 3.7–5.3)
Sodium: 133 mEq/L — ABNORMAL LOW (ref 137–147)
Total Protein: 6.2 g/dL (ref 6.0–8.3)

## 2013-10-10 LAB — GLUCOSE, CAPILLARY
GLUCOSE-CAPILLARY: 96 mg/dL (ref 70–99)
GLUCOSE-CAPILLARY: 101 mg/dL — AB (ref 70–99)
Glucose-Capillary: 136 mg/dL — ABNORMAL HIGH (ref 70–99)

## 2013-10-10 LAB — CBC WITH DIFFERENTIAL/PLATELET
Basophils Absolute: 0 10*3/uL (ref 0.0–0.1)
Basophils Relative: 0 % (ref 0–1)
EOS ABS: 0.1 10*3/uL (ref 0.0–0.7)
EOS PCT: 1 % (ref 0–5)
HCT: 29.6 % — ABNORMAL LOW (ref 39.0–52.0)
Hemoglobin: 9.3 g/dL — ABNORMAL LOW (ref 13.0–17.0)
Lymphocytes Relative: 7 % — ABNORMAL LOW (ref 12–46)
Lymphs Abs: 0.7 10*3/uL (ref 0.7–4.0)
MCH: 27 pg (ref 26.0–34.0)
MCHC: 31.4 g/dL (ref 30.0–36.0)
MCV: 85.8 fL (ref 78.0–100.0)
Monocytes Absolute: 0.4 10*3/uL (ref 0.1–1.0)
Monocytes Relative: 4 % (ref 3–12)
NEUTROS PCT: 88 % — AB (ref 43–77)
Neutro Abs: 8.3 10*3/uL — ABNORMAL HIGH (ref 1.7–7.7)
PLATELETS: 226 10*3/uL (ref 150–400)
RBC: 3.45 MIL/uL — ABNORMAL LOW (ref 4.22–5.81)
RDW: 17.8 % — AB (ref 11.5–15.5)
WBC: 9.5 10*3/uL (ref 4.0–10.5)

## 2013-10-10 LAB — CULTURE, RESPIRATORY W GRAM STAIN

## 2013-10-10 LAB — CULTURE, RESPIRATORY

## 2013-10-10 LAB — CK TOTAL AND CKMB (NOT AT ARMC)
CK, MB: 5.5 ng/mL — ABNORMAL HIGH (ref 0.3–4.0)
Relative Index: INVALID (ref 0.0–2.5)
Total CK: 42 U/L (ref 7–232)

## 2013-10-10 LAB — LACTATE DEHYDROGENASE: LDH: 265 U/L — ABNORMAL HIGH (ref 94–250)

## 2013-10-10 LAB — TROPONIN I: Troponin I: 1.61 ng/mL (ref <0.30)

## 2013-10-10 MED ORDER — CLOPIDOGREL BISULFATE 75 MG PO TABS
75.0000 mg | ORAL_TABLET | Freq: Every day | ORAL | Status: DC
  Administered 2013-10-10 – 2013-10-11 (×2): 75 mg via ORAL
  Filled 2013-10-10 (×2): qty 1

## 2013-10-10 MED ORDER — FUROSEMIDE 10 MG/ML IJ SOLN
40.0000 mg | Freq: Two times a day (BID) | INTRAMUSCULAR | Status: DC
Start: 2013-10-10 — End: 2013-10-11
  Administered 2013-10-10 – 2013-10-11 (×3): 40 mg via INTRAVENOUS
  Filled 2013-10-10 (×5): qty 4

## 2013-10-10 MED ORDER — SODIUM CHLORIDE 0.9 % IV SOLN
12.5000 mg | Freq: Once | INTRAVENOUS | Status: AC
  Administered 2013-10-10: 12.5 mg via INTRAVENOUS
  Filled 2013-10-10: qty 0.5

## 2013-10-10 MED ORDER — MIDAZOLAM HCL 2 MG/2ML IJ SOLN
INTRAMUSCULAR | Status: AC
  Filled 2013-10-10: qty 2

## 2013-10-10 MED ORDER — FENTANYL CITRATE 0.05 MG/ML IJ SOLN
INTRAMUSCULAR | Status: AC
  Filled 2013-10-10: qty 2

## 2013-10-10 MED ORDER — SODIUM CHLORIDE 0.9 % IV SOLN
INTRAVENOUS | Status: AC | PRN
  Administered 2013-10-10: 10 mL/h via INTRAVENOUS

## 2013-10-10 NOTE — Sedation Documentation (Signed)
O2 2L/[place] in place

## 2013-10-10 NOTE — Progress Notes (Signed)
eLink Physician-Brief Progress Note Patient Name: Taylor Keller DOB: [birth year] MRN: [id number]   Date of Service  10/10/2013  HPI/Events of Note  Chart reviewed, discussed with RN Renal biopsy held today by IR due to positive troponin  eICU Interventions  Restart plavix given recent drug eluding stent     Intervention Category Intermediate Interventions: Diagnostic test evaluation Minor Interventions: Communication with other healthcare providers and/or family  Simonne Maffucci 10/10/2013, 5:24 PM

## 2013-10-10 NOTE — Progress Notes (Signed)
PULMONARY / CRITICAL CARE MEDICINE   Name: Taylor Keller MRN: [id number] DOB: [birth year]    ADMISSION DATE:  10/07/2013 CONSULTATION DATE:  10/07/2013  REFERRING MD :  Inpatient rehab  CHIEF COMPLAINT:  fever  INITIAL PRESENTATION: 78 year old male with recent cardiac arrest 2nd to MI, and cerebellar hemorrhage, the latter of which he was discharged for to inpatient rehab 8/25. While there, he developed fevers and lethargy. Medical staff there was concerned for sepsis, PCCM called to readmit.   STUDIES:  CT head 8/31 > No new intracranial hemorrhage. No evidence of an ischemic infarct. No evidence of hydrocephalus. There has been evolution of the right cerebellar infarct, which measures smaller and is less dense, although the overall mass-effect  is similar. There is significantly less intraventricular hemorrhage.  SIGNIFICANT EVENTS: 8/25 - discharged from NICU to inpatient rehab 9/1 readmitted to NICU for presumed septic shock, resp failure, fever 103 9/2- improved pressors 9/2 EEG>>>generalized irregular slow activity 9/2 [place] abdo>>> Multiple gallstones. 4.2 cm right renal mass. This is suspicious for renal cell  carcinoma. Two large solid retroperitoneal mass lesions, the largest  measures 9.6 cm in diameter. Metastatic disease, primary  retroperitoneal tumor, and lymphoma could present in this fashion.   9/3 improved neurostatus 9/3 CT>>>7.4 cm enhancing mass is seen involving the right kidney consistent with renal cell carcinoma. It may evolve the renal vein in the hilum, but does not appear to extend significantly centrally. 3.7 cm right adrenal mass is noted consistent with metastatic disease.Large multiloculated mass is noted in the right retroperitoneal region measuring 10 x 5.9 cm consistent with metastatic disease  SUBJECTIVE: no distress  VITAL SIGNS: Temp:  [96.9 F (36.1 C)-99.8 F (37.7 C)] 97.5 F (36.4 C) (09/04 0800) Pulse Rate:  [67-108] 77 (09/04  0800) Resp:  [13-29] 19 (09/04 0800) BP: (102-148)/(52-70) 116/60 mmHg (09/04 0800) SpO2:  [97 %-100 %] 99 % (09/04 0800) Arterial Line BP: (70-185)/(61-128) 162/62 mmHg (09/03 1100) Weight:  [74.1 kg (163 lb 5.8 oz)] 74.1 kg (163 lb 5.8 oz) (09/04 0400) HEMODYNAMICS: CVP:  [6 mmHg-23 mmHg] 6 mmHg VENTILATOR SETTINGS:   INTAKE / OUTPUT:  Intake/Output Summary (Last 24 hours) at 10/10/13 0853 Last data filed at 10/10/13 0800  Gross per 24 hour  Intake   1265 ml  Output   1095 ml  Net    170 ml    PHYSICAL EXAMINATION: General:  Elderly male, no distress Neuro:  Awake, perr HEENT:  no JVD noted Cardiovascular:  s1 s2 rrr Lungs:  Slight coarse bases Abdomen:  Soft, non-distended. None tender Musculoskeletal:  No edema Skin:  Intact  LABS:  CBC  Recent Labs Lab 10/08/13 0630 10/09/13 0500 10/10/13 0059  WBC 19.8* 11.2* 9.5  HGB 10.9* 9.9* 9.3*  HCT 33.8* 31.5* 29.6*  PLT 304 228 226   Coag's  Recent Labs Lab 10/07/13 1929 10/09/13 0500  APTT 37 33  INR 1.62* 1.49   BMET  Recent Labs Lab 10/08/13 0630 10/09/13 0500 10/10/13 0059  NA 132* 134* 133*  K 4.1 4.1 3.7  CL 98 102 101  CO2 19 19 20   BUN 34* 26* 22  CREATININE 1.65* 1.18 0.98  GLUCOSE 84 127* 105*   Electrolytes  Recent Labs Lab 10/08/13 0630 10/09/13 0500 10/10/13 0059  CALCIUM 8.8 8.1* 8.4   Sepsis Markers  Recent Labs Lab 10/07/13 1834 10/07/13 1929 10/08/13 0010 10/08/13 0630 10/09/13 0500  LATICACIDVEN 1.7  --  1.2 1.0  --  PROCALCITON  --  8.86  --  7.43 4.88   ABG  Recent Labs Lab 10/08/13 0825 10/09/13 0438 10/09/13 0938  PHART 7.507* 7.437 7.407  PCO2ART 28.0* 30.8* 32.3*  PO2ART 205.0* 177.0* 126.0*   Liver Enzymes  Recent Labs Lab 10/07/13 1929 10/09/13 0500 10/10/13 0059  AST 24 24 21   ALT 19 18 15   ALKPHOS 60  60 54 53  BILITOT 0.5 0.3 0.4  ALBUMIN 2.6* 1.9* 1.9*   Cardiac Enzymes  Recent Labs Lab 10/09/13 1244 10/09/13 1940  10/10/13 0059  TROPONINI 1.77* 1.91* 1.61*   Glucose  Recent Labs Lab 10/09/13 1152 10/09/13 1536 10/09/13 1950 10/09/13 2338 10/10/13 0343 10/10/13 0804  GLUCAP 108* 105* 95 98 96 101*    Imaging Ct Chest Wo Contrast  10/09/2013   CLINICAL DATA:  Metastatic disease, infiltrates.  EXAM: CT CHEST WITHOUT CONTRAST  CT ABDOMEN AND PELVIS WITH CONTRAST  TECHNIQUE: Multidetector CT imaging of the chest was performed without intravenous contrast administration.  Multidetector CT imaging of the abdomen and pelvis was performed following the standard protocol during bolus administration of intravenous contrast.  CONTRAST:  156mL OMNIPAQUE IOHEXOL 300 MG/ML  SOLN  COMPARISON:  None.  FINDINGS: CT CHEST FINDINGS  Endotracheal tube is in grossly good position. Nasogastric tube is seen passing through the esophagus with tip in distal stomach. No pneumothorax or significant pleural effusion is noted. Minimal posterior basilar subsegmental atelectasis is noted bilaterally. Minimal ill-defined opacity is noted in the right lower lobe which may represent focal atelectasis or possibly early infiltrate. Coronary artery stents are noted. Right internal jugular catheter is noted with distal tip at the cavoatrial junction. No mediastinal mass or significant adenopathy is noted. Bilateral old lower rib fractures are noted.  CT ABDOMEN AND PELVIS FINDINGS  Gallbladder is distended, but no gallstones or significant gallbladder wall thickening is noted. The liver, spleen and pancreas appear normal. 3.7 cm right adrenal mass is noted consistent with metastatic disease. Left adrenal gland appears normal. Left kidney appears normal. Atherosclerotic calcifications of abdominal aorta are noted without aneurysm formation. 7.4 x 6.2 cm irregular enhancing mass is seen arising from the right kidney concerning for renal cell carcinoma. There is an adjacent large multilobulated mass in the right retroperitoneal region which measures  10.0 x 5.9 cm most consistent with metastatic disease. There is no evidence of bowel obstruction. Sigmoid diverticulosis is noted without inflammation. No abnormal fluid collection is noted. Urinary bladder is decompressed secondary to Foley catheter. Mild prostatic hypertrophy is noted.  IMPRESSION: Endotracheal and nasogastric tubes are in grossly good position.  Minimal bilateral posterior basilar subsegmental atelectasis is noted. Also noted is minimal ill-defined opacity in the right lower lobe which may represent focal atelectasis or possibly early pneumonia. Followup radiographs are recommended.  7.4 cm enhancing mass is seen involving the right kidney consistent with renal cell carcinoma. It may evolve the renal vein in the hilum, but does not appear to extend significantly centrally. 3.7 cm right adrenal mass is noted consistent with metastatic disease. Large multiloculated mass is noted in the right retroperitoneal region measuring 10 x 5.9 cm consistent with metastatic disease.   Electronically Signed   By: Sabino Dick M.D.   On: 10/09/2013 13:57   Ct Abdomen Pelvis W Contrast  10/09/2013   CLINICAL DATA:  Metastatic disease, infiltrates.  EXAM: CT CHEST WITHOUT CONTRAST  CT ABDOMEN AND PELVIS WITH CONTRAST  TECHNIQUE: Multidetector CT imaging of the chest was performed without intravenous contrast administration.  Multidetector CT imaging of the abdomen and pelvis was performed following the standard protocol during bolus administration of intravenous contrast.  CONTRAST:  187mL OMNIPAQUE IOHEXOL 300 MG/ML  SOLN  COMPARISON:  None.  FINDINGS: CT CHEST FINDINGS  Endotracheal tube is in grossly good position. Nasogastric tube is seen passing through the esophagus with tip in distal stomach. No pneumothorax or significant pleural effusion is noted. Minimal posterior basilar subsegmental atelectasis is noted bilaterally. Minimal ill-defined opacity is noted in the right lower lobe which may represent  focal atelectasis or possibly early infiltrate. Coronary artery stents are noted. Right internal jugular catheter is noted with distal tip at the cavoatrial junction. No mediastinal mass or significant adenopathy is noted. Bilateral old lower rib fractures are noted.  CT ABDOMEN AND PELVIS FINDINGS  Gallbladder is distended, but no gallstones or significant gallbladder wall thickening is noted. The liver, spleen and pancreas appear normal. 3.7 cm right adrenal mass is noted consistent with metastatic disease. Left adrenal gland appears normal. Left kidney appears normal. Atherosclerotic calcifications of abdominal aorta are noted without aneurysm formation. 7.4 x 6.2 cm irregular enhancing mass is seen arising from the right kidney concerning for renal cell carcinoma. There is an adjacent large multilobulated mass in the right retroperitoneal region which measures 10.0 x 5.9 cm most consistent with metastatic disease. There is no evidence of bowel obstruction. Sigmoid diverticulosis is noted without inflammation. No abnormal fluid collection is noted. Urinary bladder is decompressed secondary to Foley catheter. Mild prostatic hypertrophy is noted.  IMPRESSION: Endotracheal and nasogastric tubes are in grossly good position.  Minimal bilateral posterior basilar subsegmental atelectasis is noted. Also noted is minimal ill-defined opacity in the right lower lobe which may represent focal atelectasis or possibly early pneumonia. Followup radiographs are recommended.  7.4 cm enhancing mass is seen involving the right kidney consistent with renal cell carcinoma. It may evolve the renal vein in the hilum, but does not appear to extend significantly centrally. 3.7 cm right adrenal mass is noted consistent with metastatic disease. Large multiloculated mass is noted in the right retroperitoneal region measuring 10 x 5.9 cm consistent with metastatic disease.   Electronically Signed   By: Sabino Dick M.D.   On: 10/09/2013  13:57   Dg Chest Port 1 View  10/09/2013   CLINICAL DATA:  Assess support tube and line placement  EXAM: PORTABLE CHEST - 1 VIEW  COMPARISON:  portable chest x-ray of October 08, 2013  FINDINGS: The lungs are mildly hypoinflated. The interstitial markings remain mildly increased. The cardiac silhouette is normal in size. The pulmonary vascularity is not engorged.  The endotracheal tube lies 4.1 cm above the crotch of the carina. The esophagogastric tube tip in proximal port below project below the hemidiaphragm. The right internal jugular venous catheter tip lies in the region of the distal SVC.  IMPRESSION: There is stable positioning of the support tubes and lines. There has been slight interval improvement in the appearance of the pulmonary interstitium since yesterday's study.   Electronically Signed   By: David  Martinique   On: 10/09/2013 07:48     ASSESSMENT / PLAN:  PULMONARY OETT 9/1 >>>9/3 A: Acute respiratory failure Mild edema atx P:   Consider neg balance IS important  CARDIOVASCULAR CVL- RIJ 9/1 >>> A:  Hypovolemia resolved Septic shock- resolved likely stress ischemia (has DES 2 months ago)  P:  Echo will re order Asa BB when able Tele consider dc line neck plavix once renal bx decided needs-  d/w urology, NO BX required likely, he will decide today on plavix restart  RENAL A:   AKI - baseline creat 1.1 Hypovolemia, hyponatremia Renal mass- renal cell ca P:   kvo consider lasix Lytes in am  D/w urology they will see him today, keep plavix hold for now  GASTROINTESTINAL A:   NGT irritation- resolved P:   SUP: IV PPI Diet if able , slp  HEMATOLOGIC A:   Anemia - chronic Leukocytosis resolving dvt risk coagulapathy ( sepsis?) - resolved  P:  Sub q hep   INFECTIOUS A:   Septic shock, etiology unclear R/o aspiration event  P:   BCx2 9/1 >>> UC 9/1 >>> Sputum 9/1 >>>candida (not a pathogen) Abx: Zosyn, start date 9/1>>>9/2 Abx: ceftaz  9/2>>>will consider a full course given nosocomial exposure Abx: Vanc, start date 9/1>>>9/3 Add stop date 7 days ceftaz including zosyn day 1  ENDOCRINE A:   Hypothyroid No evidence for rel AI P:   CBG monitoring SSI  NEUROLOGIC A:   Acute encephalopathy - secondary to hypotension in setting carotid dz Recent cerebellar bleed without hydro P:   Pt / ot  Global: urology to see, to sdu, triad, lasix, BB when able  I have personally obtained a history, examined the patient, evaluated laboratory and imaging results, formulated the assessment and plan   Lavon Paganini. Titus Mould, MD, [place] Pgr: [facility]

## 2013-10-10 NOTE — Progress Notes (Signed)
OT Cancellation Note  Patient Details Name: Taylor Keller MRN: [id number] DOB: 10-Feb-1935   Cancelled Treatment:    Reason Eval/Treat Not Completed: Other (comment) Family discussing plan of care with MD regarding management of renal mass. Will assess later to assist with D/C planning as appropriate. Thank you Clay, OTR/L  [phone] 10/10/2013 10/10/2013, 12:39 PM

## 2013-10-10 NOTE — Progress Notes (Signed)
Patient ID: Taylor Keller, male   DOB: 15-Dec-1935, 78 y.o.   MRN: [id number]    Pt in IR for renal mass biopsy Noted continued high Troponin  Dr Barbie Banner discussed with Dr Earnest Conroy- PCCM  Agree will hold off now Procedure possibly unsafe with these levels  Rec: re visit biopsy when cardiac labs are wnl

## 2013-10-10 NOTE — Sedation Documentation (Signed)
Case cancelled due to elevated Troponins

## 2013-10-10 NOTE — Consult Note (Signed)
I will do a formal consult over the weekend. I really think that he has renal cell Ca.  However, we MUST get a bx to prove this.  For kidney cancer, it is vitally important that we determine if this is a clear cell subtype or a sarcomatoid subtype. Go away then we will no this is with a biopsy.  Of note, his corrected calcium is borderline high. His LDH is borderline high. These so ago along with kidney cancer.  I does wonder if his cerebral bleed is not from a met. His MRI, apparently, will be repeated.  CT of the chest is negative for pulmonary metastases. This would make it unlikely that he has a cerebellar metastasis.  I would consider doing a bone scan on him. He be very interested to see if he has any bony disease.  If he has no other sites of disease, I just wonder if we cannot convince urology to do surgery. It looks like his tumor is quite bulky and that "debulking" would give him the best chance at long-term survival. Another possibility is that we could treat him in a "neoadjuvant" manner and try to get some tumor shrinkage and then get his tumor resected once he is further out from his cardiac event.  Again, I would stop over the weekend and see him.  Pete E.  James 1:5-7  

## 2013-10-10 NOTE — Evaluation (Signed)
Physical Therapy Evaluation Patient Details Name: Taylor Keller MRN: [id number] DOB: 08/07/1935 Today's Date: 10/10/2013   History of Present Illness  78 year old male with recent cardiac arrest 2nd to MI, and cerebellar hemorrhage, the latter of which he was discharged for to inpatient rehab 8/25. While there, he developed fevers and lethargy. Medical staff there was concerned for sepsis, during work up Rt renal mass; retroperitoneal mass; adrenal mass found.     Clinical Impression  Patient demonstrates deficits in functional mobility as indicated below. Patient will need continued skilled Pt to address mobility deficits. At this time, awaiting updates from medical staff regarding POC. If plan is to pursue rehab, recommend return to CIR.    Follow Up Recommendations CIR    Equipment Recommendations  None recommended by PT    Recommendations for Other Services Rehab consult;Other (comment) (Palliative Consult)     Precautions / Restrictions Precautions Precautions: Fall Precaution Comments: dizziness w/ all movement, nystagmus w/ R gaze, uncoordinated movements Restrictions Weight Bearing Restrictions: No      Mobility  Bed Mobility Overal bed mobility: Needs Assistance Bed Mobility: Rolling;Supine to Sit;Sit to Supine Rolling: Min assist   Supine to sit: Mod assist Sit to supine: Mod assist   General bed mobility comments: VCs for positioing, assist to elevate to upright, assist for LE initiation and movement to and from bed  Transfers Overall transfer level: Needs assistance Equipment used: 2 person hand held assist (face to face with gait belt ) Transfers: Sit to/from Stand Sit to Stand: +2 physical assistance;Mod assist Stand pivot transfers: Mod assist;+2 physical assistance       General transfer comment: pivot to [facility] and return to bed  Ambulation/Gait                Stairs            Wheelchair Mobility    Modified Rankin (Stroke  Patients Only) Modified Rankin (Stroke Patients Only) Pre-Morbid Rankin Score: No significant disability Modified Rankin: Moderately severe disability     Balance     Sitting balance-Leahy Scale: Fair     Standing balance support: Bilateral upper extremity supported;During functional activity Standing balance-Leahy Scale: Poor Standing balance comment: patient with instability upon static standing                             Pertinent Vitals/Pain Pain Assessment: No/denies pain    Home Living Family/patient expects to be discharged to:: Private residence Living Arrangements: Spouse/significant other Available Help at Discharge: Family;Available 24 hours/day Type of Home: House Home Access: Stairs to enter Entrance Stairs-Rails: Left Entrance Stairs-Number of Steps: 4 Home Layout: Able to live on main level with bedroom/bathroom Home Equipment: Walker - 2 wheels;Bedside commode      Prior Function Level of Independence: Independent         Comments: retired Journalist, newspaper   Dominant Hand: Right    Extremity/Trunk Assessment               Lower Extremity Assessment: Generalized weakness RLE Deficits / Details: Ataxic with generalized wekaness     Cervical / Trunk Assessment: Normal  Communication      Cognition Arousal/Alertness: Awake/alert Behavior During Therapy: WFL for tasks assessed/performed Overall Cognitive Status: Impaired/Different from baseline Area of Impairment: Orientation;Memory;Following commands;Safety/judgement;Awareness;Problem solving Orientation Level: Disoriented to;Time   Memory: Decreased short-term memory Following Commands: Follows one step commands with increased time  Safety/Judgement: Decreased awareness of safety;Decreased awareness of deficits Awareness: Emergent Problem Solving: Slow processing;Difficulty sequencing      General Comments      Exercises        Assessment/Plan    PT  Assessment Patient needs continued PT services  PT Diagnosis Difficulty walking;Generalized weakness   PT Problem List Decreased strength;Decreased range of motion;Decreased activity tolerance;Decreased balance;Decreased mobility;Decreased knowledge of use of DME;Decreased safety awareness;Cardiopulmonary status limiting activity;Decreased knowledge of precautions  PT Treatment Interventions DME instruction;Gait training;Stair training;Functional mobility training;Therapeutic activities;Therapeutic exercise;Neuromuscular re-education;Patient/family education   PT Goals (Current goals can be found in the Care Plan section) Acute Rehab PT Goals Patient Stated Goal: to be able to take care of myself PT Goal Formulation: With patient/family Time For Goal Achievement: 10/24/13 Potential to Achieve Goals: Fair    Frequency Min 3X/week   Barriers to discharge        Co-evaluation               End of Session Equipment Utilized During Treatment: Gait belt Activity Tolerance: Patient tolerated treatment well Patient left: in bed;with call bell/phone within reach;with family/visitor present Nurse Communication: Mobility status         Time: 4944-9675 PT Time Calculation (min): 18 min   Charges:   PT Evaluation $Initial PT Evaluation Tier I: 1 Procedure PT Treatments $Therapeutic Activity: 8-22 mins   PT G CodesDuncan Dull 10/10/2013, 2:27 PM Alben Deeds, [place] DPT  [phone]

## 2013-10-10 NOTE — Progress Notes (Addendum)
STROKE TEAM PROGRESS NOTE   HISTORY HPI: Taylor Keller is an 78 y.o. male just recently discharged to rehab with right cerebellar hemorrhage. As per wife, he apparently doing well in rehab. However, developed low grade fever and bouts of hypotension, as well as increased lethargy and somnolence. Yesterday showed marked elevated WBC count and suspect sepsis. He was transferred to [facility] and admitted by [facility] team. Repeat CT head showed evolution of right cerebellar hemorrhage with near resolution of IVH and no hydrocephalus. He had hypotension after admission and was put on pressors. He also became apnea during bronchoscopy, and he was intubated. Bronch did not show aspiration or pneumonia. UA was negative also. Blood culture pending. Neurology was consulted for AMS. Stroke is not the reason leading to this admission.  SUBJECTIVE (INTERVAL HISTORY) His wife is at the bedside.  He was extubated yesterday. Overall he feels his condition is much improve. He is awake alert follow commands. Afebrile. Pt troponin trending down, cardiology consider demand ischemia. Urology consulted for renal cell carcinoma.   OBJECTIVE Temp:  [96.9 F (36.1 C)-99.8 F (37.7 C)] 97.5 F (36.4 C) (09/04 0800) Pulse Rate:  [67-108] 77 (09/04 1000) Cardiac Rhythm:  [-] Normal sinus rhythm (09/04 0800) Resp:  [16-29] 22 (09/04 1000) BP: (102-148)/(52-70) 113/59 mmHg (09/04 1000) SpO2:  [97 %-100 %] 99 % (09/04 1000) Arterial Line BP: (162)/(62) 162/62 mmHg (09/03 1100) Weight:  [163 lb 5.8 oz (74.1 kg)] 163 lb 5.8 oz (74.1 kg) (09/04 0400)   Recent Labs Lab 10/09/13 1536 10/09/13 1950 10/09/13 2338 10/10/13 0343 10/10/13 0804  GLUCAP 105* 95 98 96 101*    Recent Labs Lab 10/07/13 1834 10/07/13 1929 10/08/13 0630 10/09/13 0500 10/10/13 0059  NA 134* 131* 132* 134* 133*  K 4.4 4.4 4.1 4.1 3.7  CL 97 96 98 102 101  CO2 20 16* $Remo'19 19 20  'FcuBe$ GLUCOSE 164* 206* 84 127* 105*  BUN 34* 34* 34* 26* 22  CREATININE  2.26* 2.15* 1.65* 1.18 0.98  CALCIUM 9.2 8.9 8.8 8.1* 8.4    Recent Labs Lab 10/07/13 1834 10/07/13 1929 10/09/13 0500 10/10/13 0059  AST $Re'21 24 24 21  'XKy$ ALT $R'20 19 18 15  'Uw$ ALKPHOS 59 60  60 54 53  BILITOT 0.4 0.5 0.3 0.4  PROT 7.7 7.6 6.4 6.2  ALBUMIN 2.6* 2.6* 1.9* 1.9*    Recent Labs Lab 10/06/13 0954 10/07/13 1834 10/07/13 1929 10/08/13 0630 10/09/13 0500 10/10/13 0059  WBC 9.4 20.5* 19.1* 19.8* 11.2* 9.5  NEUTROABS 8.0* 18.9*  --   --  10.1* 8.3*  HGB 11.8* 11.0* 10.9* 10.9* 9.9* 9.3*  HCT 37.0* 34.6* 33.7* 33.8* 31.5* 29.6*  MCV 83.5 85.6 83.0 82.4 86.1 85.8  PLT 353 394 362 304 228 226    Recent Labs Lab 10/08/13 0630 10/09/13 0955 10/09/13 1244 10/09/13 1940 10/10/13 0059  CKTOTAL  --   --  52 49 42  CKMB  --   --  6.4* 7.1* 5.5*  TROPONINI 1.00* 1.41* 1.77* 1.91* 1.61*    Recent Labs  10/07/13 1929 10/09/13 0500  LABPROT 19.2* 18.0*  INR 1.62* 1.49    Recent Labs  10/08/13 1155  COLORURINE AMBER*  LABSPEC 1.027  PHURINE 5.0  GLUCOSEU NEGATIVE  HGBUR SMALL*  BILIRUBINUR SMALL*  KETONESUR NEGATIVE  PROTEINUR 30*  UROBILINOGEN 1.0  NITRITE NEGATIVE  LEUKOCYTESUR SMALL*       Component Value Date/Time   CHOL 99 05/13/2013 0833   TRIG 48 05/13/2013 0037  HDL 38* 05/13/2013 0833   CHOLHDL 2.6 05/13/2013 0833   VLDL 10 05/13/2013 0833   LDLCALC 51 05/13/2013 0833   Lab Results  Component Value Date   HGBA1C 6.0* 09/25/2013      Component Value Date/Time   LABOPIA NONE DETECTED 09/25/2013 0312   LABOPIA NEGATIVE 08/18/2008 0400   COCAINSCRNUR NONE DETECTED 09/25/2013 0312   COCAINSCRNUR NEGATIVE 08/18/2008 0400   LABBENZ NONE DETECTED 09/25/2013 0312   LABBENZ NEGATIVE 08/18/2008 0400   AMPHETMU NONE DETECTED 09/25/2013 0312   AMPHETMU NEGATIVE 08/18/2008 0400   THCU NONE DETECTED 09/25/2013 0312   LABBARB NONE DETECTED 09/25/2013 0312    No results found for this basename: ETH,  in the last 168 hours  Ct Chest Wo Contrast   10/09/2013    Endotracheal and nasogastric tubes are in grossly good position.  Minimal bilateral posterior basilar subsegmental atelectasis is noted. Also noted is minimal ill-defined opacity in the right lower lobe which may represent focal atelectasis or possibly early pneumonia. Followup radiographs are recommended.     US Abdomen Complete  10/08/2013   IMPRESSION: 1. Multiple gallstones. 2. 4.2 cm right renal mass. This is suspicious for renal cell carcinoma. 3. Two large solid retroperitoneal mass lesions, the largest measures 9.6 cm in diameter. Metastatic disease, primary retroperitoneal tumor, and lymphoma could present in this fashion. Contrast -enhanced CT of the abdomen and pelvis is suggested for further evaluation of these findings.     Ct Abdomen Pelvis W Contrast  10/09/2013   7.4 cm enhancing mass is seen involving the right kidney consistent with renal cell carcinoma. It may evolve the renal vein in the hilum, but does not appear to extend significantly centrally. 3.7 cm right adrenal mass is noted consistent with metastatic disease. Large multiloculated mass is noted in the right retroperitoneal region measuring 10 x 5.9 cm consistent with metastatic disease.     Dg Chest Port 1 View  10/09/2013   IMPRESSION: There is stable positioning of the support tubes and lines. There has been slight interval improvement in the appearance of the pulmonary interstitium since yesterday's study.   Electronically Signed   By: David  Martinique   On: 10/09/2013 07:48   Ct Head Wo Contrast  10/06/2013 IMPRESSION: 1. No new intracranial hemorrhage. No evidence of an ischemic infarct. No evidence of hydrocephalus. 2. There has been evolution of the right cerebellar infarct, which measures smaller and is less dense, although the overall mass-effect is similar. There is significantly less intraventricular hemorrhage.   2D echo 10/10/13 - Left ventricle: Abnormal septal motion and inferior wall hypokinesis. The cavity size was  normal. Wall thickness was increased in a pattern of severe LVH. Systolic function was mildly reduced. The estimated ejection fraction was in the range of 45% to 50%. Wall motion was normal; there were no regional wall motion abnormalities. Doppler parameters are consistent with abnormal left ventricular relaxation (grade 1 diastolic dysfunction). - Mitral valve: There was mild regurgitation. - Atrial septum: There was increased thickness of the septum, consistent with lipomatous hypertrophy.  PHYSICAL EXAM  Temp:  [96.9 F (36.1 C)-99.8 F (37.7 C)] 97.5 F (36.4 C) (09/04 0800) Pulse Rate:  [67-108] 77 (09/04 1000) Resp:  [16-29] 22 (09/04 1000) BP: (102-148)/(52-70) 113/59 mmHg (09/04 1000) SpO2:  [97 %-100 %] 99 % (09/04 1000) Arterial Line BP: (162)/(62) 162/62 mmHg (09/03 1100) Weight:  [163 lb 5.8 oz (74.1 kg)] 163 lb 5.8 oz (74.1 kg) (09/04 0400)  General - thin, well  developed, in no apparent distress, but appears tired.  Ophthalmologic - not able to see through.  Cardiovascular - Regular rate and rhythm with no murmur.  Mental Status -  Level of arousal and orientation to time, place, and person were intact. Language including expression, naming, repetition, comprehension was assessed and found intact, but hypophonia. Fund of Knowledge was assessed and was intact.  Cranial Nerves II - XII - II - Visual field intact OU. III, IV, VI - Extraocular movements intact. V - Facial sensation intact bilaterally. VII - Facial movement intact bilaterally. VIII - nystagmus at right horizontal gaze. X - Palate elevates symmetrically. XI - Chin turning & shoulder shrug intact bilaterally. XII - Tongue protrusion intact.  Motor Strength - The patient's strength was 4/5 in all extremities and pronator drift was absent.  Bulk was normal and fasciculations were absent.   Motor Tone - Muscle tone was assessed at the neck and appendages and was normal.  Reflexes - The patient's  reflexes were normal in all extremities and he had no pathological reflexes.  Sensory - Light touch, temperature/pinprick were assessed and were normal.    Coordination - The patient had mild dysmetria on the right hand.  Tremor was absent.  Gait and Station - not tested.   ASSESSMENT/PLAN Mr. TOMA ARTS is a 78 y.o. male with hx of R cerebellar ICH, d/c to CIR. Transfer back with fever and lethargy, concerning for sepsis. Stable from the stroke standpoint, with hemorrhage evolution. Still not clear etiology of sepsis and septic shock, but responding to abx and supportive care.   Sepsis and septic shock, resolved - BP under control and off pressor - fever resolved - WBC trending down nicely and normalized today - elevated procalcitronin indicating sepsis - treated - source not clear so far  - UA negative 8/31 but 21-50 WBC on 9/2, urine culture negative  - blood culture NGTD  - CXR and bronch not much concern for pneumonia or aspiration.   - continue empiric treatment of bacterial meningitis and follow up with blood culture.   Respiratory failure  - extubated   - CT chest unremarkable - manage as per CCM   AMS  - resolved - ikely due to sepsis and septic shock  - EEG ruled out seizure and NCSE.   right cerebellar hemorrhage with IVH last admission secondary to hypertension  CT head showed evolving hemorrahge  No hydrocephalus.  Seems not the reason for this admission  Continue monitoring  CAD  - Coronary artery disease with cardiac arrest July 2015 & stent placement x 2. Placed on aspirin and plavix at that time. plavix on hold due to hemorrhage last admission  - cardiology recommended to resume plavix whenever appropriate  - troponin elevated, continue to trend - cardiology on board, do not think NSTEMI  - series EKGs  - from neuro standpoint, OK to restart antilplatelet for CAD s/p stenting - on ASA $Remo'81mg'TkGln$  now. Agree to wait urology consult or any procedure to be  performed before consider starting plavix.   Renal cell carcinoma with met - confirmed with [place] and CT  - likely mets to adrenal gland and retroperitoneal - urology consult called  Hypothyroid  On synthroid  TSH [facility] day # 3  Neurology will sign off for now. Please do not hesitate to call us with any questions. Thank you for the consult.  Rosalin Hawking, MD PhD Stroke Neurology 10/10/2013 10:50 AM  To contact Stroke Continuity provider, please  refer to [email or web address] After hours, contact General Neurology

## 2013-10-10 NOTE — Progress Notes (Signed)
PT Cancellation Note  Patient Details Name: HUCK ASHWORTH MRN: [id number] DOB: [birth year]   Cancelled Treatment:     Patient and family speaking with MD at this time, will follow up as able.   Duncan Dull 10/10/2013, 11:38 AM Alben Deeds, PT DPT  [phone]

## 2013-10-10 NOTE — Progress Notes (Signed)
Rehab Admissions Coordinator Note:  Patient was screened by Retta Diones for appropriateness for an Inpatient Acute Rehab Consult.  Patient known to [place] from recent inpatient rehab stay.  Noted PT recommending return to inpatient rehab if plan is to pursue rehab.  Please reconsult inpatient rehab or call us if return to inpatient rehab is medical team's desire.  Jodell Cipro M 10/10/2013, 5:00 PM  I can be reached at [phone].

## 2013-10-10 NOTE — H&P (Signed)
Taylor Keller is an 78 y.o. male.    Chief Complaint: Pt with Hx CAD/MI- DES placed LAD and Lt circumflex 08/2013 Presented to ED 09/25/13 with Nausea/vomiting and headache; dizziness and facial droop Suffered cerebellar hemorrhage per CT and MRI Plavix stopped then Was sent to Rehab at [facility] Admitted to Neuro ICU with fevers possible sepsis Pt improving  Work up also reveals Rt renal mass; retroperitoneal mass; adrenal mass Dr Harland Dingwall has asked for IR consult for renal mass biopsy Dr Barbie Banner has reviewed imaging and chart I have seen and examined pt Now scheduled for same  HPI: HLD; hypothyroid; HTN; carotid stenosis; CVA  Past Medical History  Diagnosis Date  . Lumbar stenosis     L5  . Hypothyroidism   . Hyperlipidemia   . Hypertension   . ED (erectile dysfunction)   . Internal carotid artery stenosis 5/15    bilateral, 40-59%  . Myocardial infarction 08/2013  . Heart murmur   . Stroke ~ 2011    "slight; no permanent damage"    Past Surgical History  Procedure Laterality Date  . Tonsillectomy and adenoidectomy    . Appendectomy    . Coronary angioplasty with stent placement  08/2013    "2"  . Cataract extraction, bilateral Bilateral ~ 2012    Family History  Problem Relation Age of Onset  . Colon cancer Sister     colon   Social History:  reports that he has never smoked. He has never used smokeless tobacco. He reports that he drinks alcohol. He reports that he does not use illicit drugs.  Allergies:  Allergies  Allergen Reactions  . Benazepril Cough    Medications Prior to Admission  Medication Sig Dispense Refill  . acetaminophen (TYLENOL) 325 MG tablet Take 2 tablets (650 mg total) by mouth every 4 (four) hours as needed for headache or mild pain.      Marland Kitchen albuterol (PROVENTIL) (5 MG/ML) 0.5% nebulizer solution Take 2.5 mg by nebulization every 6 (six) hours as needed for wheezing or shortness of breath.      Marland Kitchen aspirin 81 MG chewable tablet Chew 81 mg by  mouth daily.      Marland Kitchen atorvastatin (LIPITOR) 80 MG tablet Take 80 mg by mouth every morning.       . clopidogrel (PLAVIX) 75 MG tablet Take 75 mg by mouth daily.      . diphenhydrAMINE (BENADRYL) 25 MG tablet Take 25 mg by mouth at bedtime as needed for sleep.       . furosemide (LASIX) 40 MG tablet Take 1 tablet (40 mg total) by mouth daily.  30 tablet  11  . ipratropium (ATROVENT) 0.02 % nebulizer solution Take 0.5 mg by nebulization every 6 (six) hours as needed for wheezing or shortness of breath.      . isosorbide mononitrate (IMDUR) 60 MG 24 hr tablet Take 60 mg by mouth daily.      . lansoprazole (PREVACID SOLUTAB) 30 MG disintegrating tablet Take 30 mg by mouth daily.      Marland Kitchen levothyroxine (SYNTHROID, LEVOTHROID) 175 MCG tablet Take 175 mcg by mouth daily.      Marland Kitchen losartan (COZAAR) 25 MG tablet Take 1 tablet (25 mg total) by mouth daily.  30 tablet  11  . metoprolol (LOPRESSOR) 50 MG tablet Take 50 mg by mouth 2 (two) times daily.      . nitroGLYCERIN (NITROSTAT) 0.4 MG SL tablet Place 1 tablet (0.4 mg total) under the tongue every  5 (five) minutes as needed for chest pain.  25 tablet  prn    Results for orders placed during the hospital encounter of 10/07/13 (from the past 48 hour(s))  GLUCOSE, CAPILLARY     Status: Abnormal   Collection Time    10/08/13  3:52 PM      Result Value Ref Range   Glucose-Capillary 126 (*) 70 - 99 mg/dL   Comment 1 Documented in Chart     Comment 2 Notify RN    GLUCOSE, CAPILLARY     Status: Abnormal   Collection Time    10/08/13  7:23 PM      Result Value Ref Range   Glucose-Capillary 109 (*) 70 - 99 mg/dL   Comment 1 Documented in Chart     Comment 2 Notify RN    GLUCOSE, CAPILLARY     Status: Abnormal   Collection Time    10/08/13 11:16 PM      Result Value Ref Range   Glucose-Capillary 107 (*) 70 - 99 mg/dL   Comment 1 Documented in Chart     Comment 2 Notify RN    GLUCOSE, CAPILLARY     Status: Abnormal   Collection Time    10/09/13  3:30  AM      Result Value Ref Range   Glucose-Capillary 122 (*) 70 - 99 mg/dL   Comment 1 Documented in Chart     Comment 2 Notify RN    BLOOD GAS, ARTERIAL     Status: Abnormal   Collection Time    10/09/13  4:38 AM      Result Value Ref Range   FIO2 0.40     Delivery systems VENTILATOR     Mode PRESSURE REGULATED VOLUME CONTROL     VT 620     Rate 13     Peep/cpap 5.0     pH, Arterial 7.437  7.350 - 7.450   pCO2 arterial 30.8 (*) 35.0 - 45.0 mmHg   pO2, Arterial 177.0 (*) 80.0 - 100.0 mmHg   Bicarbonate 20.4  20.0 - 24.0 mEq/L   TCO2 21.3  0 - 100 mmol/L   Acid-base deficit 3.1 (*) 0.0 - 2.0 mmol/L   O2 Saturation 99.3     Patient temperature 98.6     Collection site A-LINE     Drawn by [phone]     Sample type ARTERIAL     Allens test (pass/fail) NOT INDICATED (*) PASS  COMPREHENSIVE METABOLIC PANEL     Status: Abnormal   Collection Time    10/09/13  5:00 AM      Result Value Ref Range   Sodium 134 (*) 137 - 147 mEq/L   Potassium 4.1  3.7 - 5.3 mEq/L   Chloride 102  96 - 112 mEq/L   CO2 19  19 - 32 mEq/L   Glucose, Bld 127 (*) 70 - 99 mg/dL   BUN 26 (*) 6 - 23 mg/dL   Creatinine, Ser 1.18  0.50 - 1.35 mg/dL   Calcium 8.1 (*) 8.4 - 10.5 mg/dL   Total Protein 6.4  6.0 - 8.3 g/dL   Albumin 1.9 (*) 3.5 - 5.2 g/dL   AST 24  0 - 37 U/L   ALT 18  0 - 53 U/L   Alkaline Phosphatase 54  39 - 117 U/L   Total Bilirubin 0.3  0.3 - 1.2 mg/dL   GFR calc non Af Amer 58 (*) >90 mL/min   GFR calc Af Wyvonnia Lora  67 (*) >90 mL/min   Comment: (NOTE)     The eGFR has been calculated using the CKD EPI equation.     This calculation has not been validated in all clinical situations.     eGFR's persistently <90 mL/min signify possible Chronic Kidney     Disease.   Anion gap 13  5 - 15  CBC WITH DIFFERENTIAL     Status: Abnormal   Collection Time    10/09/13  5:00 AM      Result Value Ref Range   WBC 11.2 (*) 4.0 - 10.5 K/uL   RBC 3.66 (*) 4.22 - 5.81 MIL/uL   Hemoglobin 9.9 (*) 13.0 - 17.0 g/dL    Comment: REPEATED TO VERIFY   HCT 31.5 (*) 39.0 - 52.0 %   MCV 86.1  78.0 - 100.0 fL   MCH 27.0  26.0 - 34.0 pg   MCHC 31.4  30.0 - 36.0 g/dL   RDW 17.7 (*) 11.5 - 15.5 %   Platelets 228  150 - 400 K/uL   Comment: REPEATED TO VERIFY   Neutrophils Relative % 90 (*) 43 - 77 %   Neutro Abs 10.1 (*) 1.7 - 7.7 K/uL   Lymphocytes Relative 6 (*) 12 - 46 %   Lymphs Abs 0.7  0.7 - 4.0 K/uL   Monocytes Relative 3  3 - 12 %   Monocytes Absolute 0.3  0.1 - 1.0 K/uL   Eosinophils Relative 1  0 - 5 %   Eosinophils Absolute 0.1  0.0 - 0.7 K/uL   Basophils Relative 0  0 - 1 %   Basophils Absolute 0.0  0.0 - 0.1 K/uL  APTT     Status: None   Collection Time    10/09/13  5:00 AM      Result Value Ref Range   aPTT 33  24 - 37 seconds  PROTIME-INR     Status: Abnormal   Collection Time    10/09/13  5:00 AM      Result Value Ref Range   Prothrombin Time 18.0 (*) 11.6 - 15.2 seconds   INR 1.49  0.00 - 1.49  PROCALCITONIN     Status: None   Collection Time    10/09/13  5:00 AM      Result Value Ref Range   Procalcitonin 4.88     Comment:            Interpretation:     PCT > 2 ng/mL:     Systemic infection (sepsis) is likely,     unless other causes are known.     (NOTE)             ICU PCT Algorithm               Non ICU PCT Algorithm        ----------------------------     ------------------------------             PCT < 0.25 ng/mL                 PCT < 0.1 ng/mL         Stopping of antibiotics            Stopping of antibiotics           strongly encouraged.               strongly encouraged.        ----------------------------     ------------------------------  PCT level decrease by               PCT < 0.25 ng/mL           >= 80% from peak PCT           OR PCT 0.25 - 0.5 ng/mL          Stopping of antibiotics                                                 encouraged.         Stopping of antibiotics               encouraged.        ----------------------------      ------------------------------           PCT level decrease by              PCT >= 0.25 ng/mL           < 80% from peak PCT            AND PCT >= 0.5 ng/mL            Continuing antibiotics                                                  encouraged.           Continuing antibiotics                encouraged.        ----------------------------     ------------------------------         PCT level increase compared          PCT > 0.5 ng/mL             with peak PCT AND              PCT >= 0.5 ng/mL             Escalation of antibiotics                                              strongly encouraged.          Escalation of antibiotics            strongly encouraged.  GLUCOSE, CAPILLARY     Status: Abnormal   Collection Time    10/09/13  7:57 AM      Result Value Ref Range   Glucose-Capillary 114 (*) 70 - 99 mg/dL   Comment 1 Notify RN     Comment 2 Documented in Chart    POCT I-STAT 3, ART BLOOD GAS (G3+)     Status: Abnormal   Collection Time    10/09/13  9:38 AM      Result Value Ref Range   pH, Arterial 7.407  7.350 - 7.450   pCO2 arterial 32.3 (*) 35.0 - 45.0 mmHg   pO2, Arterial 126.0 (*) 80.0 - 100.0 mmHg   Bicarbonate 20.5  20.0 - 24.0 mEq/L   TCO2 21  0 - 100  mmol/L   O2 Saturation 99.0     Acid-base deficit 4.0 (*) 0.0 - 2.0 mmol/L   Patient temperature 97.5 F     Collection site RADIAL, ALLEN'S TEST ACCEPTABLE     Drawn by Operator     Sample type ARTERIAL    TROPONIN I     Status: Abnormal   Collection Time    10/09/13  9:55 AM      Result Value Ref Range   Troponin I 1.41 (*) <0.30 ng/mL   Comment:            Due to the release kinetics of cTnI,     a negative result within the first hours     of the onset of symptoms does not rule out     myocardial infarction with certainty.     If myocardial infarction is still suspected,     repeat the test at appropriate intervals.     CRITICAL VALUE NOTED.  VALUE IS CONSISTENT WITH PREVIOUSLY REPORTED AND CALLED VALUE.   GLUCOSE, CAPILLARY     Status: Abnormal   Collection Time    10/09/13 11:52 AM      Result Value Ref Range   Glucose-Capillary 108 (*) 70 - 99 mg/dL   Comment 1 Documented in Chart     Comment 2 Notify RN    CK TOTAL AND CKMB     Status: Abnormal   Collection Time    10/09/13 12:44 PM      Result Value Ref Range   Total CK 52  7 - 232 U/L   CK, MB 6.4 (*) 0.3 - 4.0 ng/mL   Comment: CRITICAL RESULT CALLED TO, READ BACK BY AND VERIFIED WITH:     C.DUPONT,RN 1422 10/09/13 CLARK,S   Relative Index RELATIVE INDEX IS INVALID  0.0 - 2.5   Comment: WHEN CK < 100 U/L             TROPONIN I     Status: Abnormal   Collection Time    10/09/13 12:44 PM      Result Value Ref Range   Troponin I 1.77 (*) <0.30 ng/mL   Comment:            Due to the release kinetics of cTnI,     a negative result within the first hours     of the onset of symptoms does not rule out     myocardial infarction with certainty.     If myocardial infarction is still suspected,     repeat the test at appropriate intervals.     CRITICAL VALUE NOTED.  VALUE IS CONSISTENT WITH PREVIOUSLY REPORTED AND CALLED VALUE.  GLUCOSE, CAPILLARY     Status: Abnormal   Collection Time    10/09/13  3:36 PM      Result Value Ref Range   Glucose-Capillary 105 (*) 70 - 99 mg/dL  CK TOTAL AND CKMB     Status: Abnormal   Collection Time    10/09/13  7:40 PM      Result Value Ref Range   Total CK 49  7 - 232 U/L   CK, MB 7.1 (*) 0.3 - 4.0 ng/mL   Comment: CRITICAL VALUE NOTED.  VALUE IS CONSISTENT WITH PREVIOUSLY REPORTED AND CALLED VALUE.   Relative Index RELATIVE INDEX IS INVALID  0.0 - 2.5   Comment: WHEN CK < 100 U/L             TROPONIN I  Status: Abnormal   Collection Time    10/09/13  7:40 PM      Result Value Ref Range   Troponin I 1.91 (*) <0.30 ng/mL   Comment:            Due to the release kinetics of cTnI,     a negative result within the first hours     of the onset of symptoms does not rule out      myocardial infarction with certainty.     If myocardial infarction is still suspected,     repeat the test at appropriate intervals.     CRITICAL VALUE NOTED.  VALUE IS CONSISTENT WITH PREVIOUSLY REPORTED AND CALLED VALUE.  GLUCOSE, CAPILLARY     Status: None   Collection Time    10/09/13  7:50 PM      Result Value Ref Range   Glucose-Capillary 95  70 - 99 mg/dL  GLUCOSE, CAPILLARY     Status: None   Collection Time    10/09/13 11:38 PM      Result Value Ref Range   Glucose-Capillary 98  70 - 99 mg/dL  CK TOTAL AND CKMB     Status: Abnormal   Collection Time    10/10/13 12:59 AM      Result Value Ref Range   Total CK 42  7 - 232 U/L   CK, MB 5.5 (*) 0.3 - 4.0 ng/mL   Relative Index RELATIVE INDEX IS INVALID  0.0 - 2.5   Comment: WHEN CK < 100 U/L             TROPONIN I     Status: Abnormal   Collection Time    10/10/13 12:59 AM      Result Value Ref Range   Troponin I 1.61 (*) <0.30 ng/mL   Comment:            Due to the release kinetics of cTnI,     a negative result within the first hours     of the onset of symptoms does not rule out     myocardial infarction with certainty.     If myocardial infarction is still suspected,     repeat the test at appropriate intervals.     CRITICAL VALUE NOTED.  VALUE IS CONSISTENT WITH PREVIOUSLY REPORTED AND CALLED VALUE.  COMPREHENSIVE METABOLIC PANEL     Status: Abnormal   Collection Time    10/10/13 12:59 AM      Result Value Ref Range   Sodium 133 (*) 137 - 147 mEq/L   Potassium 3.7  3.7 - 5.3 mEq/L   Chloride 101  96 - 112 mEq/L   CO2 20  19 - 32 mEq/L   Glucose, Bld 105 (*) 70 - 99 mg/dL   BUN 22  6 - 23 mg/dL   Creatinine, Ser 0.98  0.50 - 1.35 mg/dL   Calcium 8.4  8.4 - 10.5 mg/dL   Total Protein 6.2  6.0 - 8.3 g/dL   Albumin 1.9 (*) 3.5 - 5.2 g/dL   AST 21  0 - 37 U/L   ALT 15  0 - 53 U/L   Alkaline Phosphatase 53  39 - 117 U/L   Total Bilirubin 0.4  0.3 - 1.2 mg/dL   GFR calc non Af Amer 78 (*) >90 mL/min   GFR  calc Af Amer >90  >90 mL/min   Comment: (NOTE)     The eGFR has been calculated using the  CKD EPI equation.     This calculation has not been validated in all clinical situations.     eGFR's persistently <90 mL/min signify possible Chronic Kidney     Disease.   Anion gap 12  5 - 15  CBC WITH DIFFERENTIAL     Status: Abnormal   Collection Time    10/10/13 12:59 AM      Result Value Ref Range   WBC 9.5  4.0 - 10.5 K/uL   RBC 3.45 (*) 4.22 - 5.81 MIL/uL   Hemoglobin 9.3 (*) 13.0 - 17.0 g/dL   HCT 29.6 (*) 39.0 - 52.0 %   MCV 85.8  78.0 - 100.0 fL   MCH 27.0  26.0 - 34.0 pg   MCHC 31.4  30.0 - 36.0 g/dL   RDW 17.8 (*) 11.5 - 15.5 %   Platelets 226  150 - 400 K/uL   Neutrophils Relative % 88 (*) 43 - 77 %   Neutro Abs 8.3 (*) 1.7 - 7.7 K/uL   Lymphocytes Relative 7 (*) 12 - 46 %   Lymphs Abs 0.7  0.7 - 4.0 K/uL   Monocytes Relative 4  3 - 12 %   Monocytes Absolute 0.4  0.1 - 1.0 K/uL   Eosinophils Relative 1  0 - 5 %   Eosinophils Absolute 0.1  0.0 - 0.7 K/uL   Basophils Relative 0  0 - 1 %   Basophils Absolute 0.0  0.0 - 0.1 K/uL  LACTATE DEHYDROGENASE     Status: Abnormal   Collection Time    10/10/13  1:07 AM      Result Value Ref Range   LDH 265 (*) 94 - 250 U/L  GLUCOSE, CAPILLARY     Status: None   Collection Time    10/10/13  3:43 AM      Result Value Ref Range   Glucose-Capillary 96  70 - 99 mg/dL  GLUCOSE, CAPILLARY     Status: Abnormal   Collection Time    10/10/13  8:04 AM      Result Value Ref Range   Glucose-Capillary 101 (*) 70 - 99 mg/dL   Comment 1 Notify RN     Comment 2 Documented in Chart     Ct Chest Wo Contrast  10/09/2013   CLINICAL DATA:  Metastatic disease, infiltrates.  EXAM: CT CHEST WITHOUT CONTRAST  CT ABDOMEN AND PELVIS WITH CONTRAST  TECHNIQUE: Multidetector CT imaging of the chest was performed without intravenous contrast administration.  Multidetector CT imaging of the abdomen and pelvis was performed following the standard protocol during  bolus administration of intravenous contrast.  CONTRAST:  126m OMNIPAQUE IOHEXOL 300 MG/ML  SOLN  COMPARISON:  None.  FINDINGS: CT CHEST FINDINGS  Endotracheal tube is in grossly good position. Nasogastric tube is seen passing through the esophagus with tip in distal stomach. No pneumothorax or significant pleural effusion is noted. Minimal posterior basilar subsegmental atelectasis is noted bilaterally. Minimal ill-defined opacity is noted in the right lower lobe which may represent focal atelectasis or possibly early infiltrate. Coronary artery stents are noted. Right internal jugular catheter is noted with distal tip at the cavoatrial junction. No mediastinal mass or significant adenopathy is noted. Bilateral old lower rib fractures are noted.  CT ABDOMEN AND PELVIS FINDINGS  Gallbladder is distended, but no gallstones or significant gallbladder wall thickening is noted. The liver, spleen and pancreas appear normal. 3.7 cm right adrenal mass is noted consistent with metastatic disease. Left adrenal gland appears  normal. Left kidney appears normal. Atherosclerotic calcifications of abdominal aorta are noted without aneurysm formation. 7.4 x 6.2 cm irregular enhancing mass is seen arising from the right kidney concerning for renal cell carcinoma. There is an adjacent large multilobulated mass in the right retroperitoneal region which measures 10.0 x 5.9 cm most consistent with metastatic disease. There is no evidence of bowel obstruction. Sigmoid diverticulosis is noted without inflammation. No abnormal fluid collection is noted. Urinary bladder is decompressed secondary to Foley catheter. Mild prostatic hypertrophy is noted.  IMPRESSION: Endotracheal and nasogastric tubes are in grossly good position.  Minimal bilateral posterior basilar subsegmental atelectasis is noted. Also noted is minimal ill-defined opacity in the right lower lobe which may represent focal atelectasis or possibly early pneumonia. Followup  radiographs are recommended.  7.4 cm enhancing mass is seen involving the right kidney consistent with renal cell carcinoma. It may evolve the renal vein in the hilum, but does not appear to extend significantly centrally. 3.7 cm right adrenal mass is noted consistent with metastatic disease. Large multiloculated mass is noted in the right retroperitoneal region measuring 10 x 5.9 cm consistent with metastatic disease.   Electronically Signed   By: Sabino Dick M.D.   On: 10/09/2013 13:57   US Abdomen Complete  10/08/2013   CLINICAL DATA:  Sepsis.  EXAM: ULTRASOUND ABDOMEN COMPLETE  COMPARISON:  Abdomen 10/07/2013.  FINDINGS: Gallbladder:  Multiple mobile gallstones. Gallbladder wall thickness 3 mm. This is upper limits normal. Negative Murphy sign. No pericholecystic fluid.  Common bile duct:  Diameter: 5 mm  Liver:  No focal lesion identified. Within normal limits in parenchymal echogenicity.  IVC:  No abnormality visualized.  Pancreas:  Visualized portion unremarkable.  Spleen:  Size and appearance within normal limits.  Right Kidney:  Length: 11.6 cm. Echogenicity within normal limits. No hydronephrosis. There appears to be a right mid renal 3.9 x 3.2 x 4.2 cm mass. This is suspicious for renal cell carcinoma. This are associated with large retroperitoneal solid masses. Mass adjacent to the abdominal aorta measures 9.6 cm in maximum diameter, mass adjacent to the IVC measures 5.3 cm in maximum diameter. These could represent metastatic lesions, primary retroperitoneal tumor, and lymphoma. Contrast-enhanced CT of the abdomen pelvis suggested to further evaluate.  Left Kidney:  Length: 12.0 cm. Echogenicity within normal limits. No mass or hydronephrosis visualized.  Abdominal aorta:  No aneurysm visualized.  Other findings:  None.  IMPRESSION: 1. Multiple gallstones. 2. 4.2 cm right renal mass. This is suspicious for renal cell carcinoma. 3. Two large solid retroperitoneal mass lesions, the largest measures 9.6  cm in diameter. Metastatic disease, primary retroperitoneal tumor, and lymphoma could present in this fashion. Contrast -enhanced CT of the abdomen and pelvis is suggested for further evaluation of these findings.   Electronically Signed   By: Marcello Moores  Register   On: 10/08/2013 18:54   Ct Abdomen Pelvis W Contrast  10/09/2013   CLINICAL DATA:  Metastatic disease, infiltrates.  EXAM: CT CHEST WITHOUT CONTRAST  CT ABDOMEN AND PELVIS WITH CONTRAST  TECHNIQUE: Multidetector CT imaging of the chest was performed without intravenous contrast administration.  Multidetector CT imaging of the abdomen and pelvis was performed following the standard protocol during bolus administration of intravenous contrast.  CONTRAST:  189m OMNIPAQUE IOHEXOL 300 MG/ML  SOLN  COMPARISON:  None.  FINDINGS: CT CHEST FINDINGS  Endotracheal tube is in grossly good position. Nasogastric tube is seen passing through the esophagus with tip in distal stomach. No pneumothorax or significant  pleural effusion is noted. Minimal posterior basilar subsegmental atelectasis is noted bilaterally. Minimal ill-defined opacity is noted in the right lower lobe which may represent focal atelectasis or possibly early infiltrate. Coronary artery stents are noted. Right internal jugular catheter is noted with distal tip at the cavoatrial junction. No mediastinal mass or significant adenopathy is noted. Bilateral old lower rib fractures are noted.  CT ABDOMEN AND PELVIS FINDINGS  Gallbladder is distended, but no gallstones or significant gallbladder wall thickening is noted. The liver, spleen and pancreas appear normal. 3.7 cm right adrenal mass is noted consistent with metastatic disease. Left adrenal gland appears normal. Left kidney appears normal. Atherosclerotic calcifications of abdominal aorta are noted without aneurysm formation. 7.4 x 6.2 cm irregular enhancing mass is seen arising from the right kidney concerning for renal cell carcinoma. There is an  adjacent large multilobulated mass in the right retroperitoneal region which measures 10.0 x 5.9 cm most consistent with metastatic disease. There is no evidence of bowel obstruction. Sigmoid diverticulosis is noted without inflammation. No abnormal fluid collection is noted. Urinary bladder is decompressed secondary to Foley catheter. Mild prostatic hypertrophy is noted.  IMPRESSION: Endotracheal and nasogastric tubes are in grossly good position.  Minimal bilateral posterior basilar subsegmental atelectasis is noted. Also noted is minimal ill-defined opacity in the right lower lobe which may represent focal atelectasis or possibly early pneumonia. Followup radiographs are recommended.  7.4 cm enhancing mass is seen involving the right kidney consistent with renal cell carcinoma. It may evolve the renal vein in the hilum, but does not appear to extend significantly centrally. 3.7 cm right adrenal mass is noted consistent with metastatic disease. Large multiloculated mass is noted in the right retroperitoneal region measuring 10 x 5.9 cm consistent with metastatic disease.   Electronically Signed   By: Sabino Dick M.D.   On: 10/09/2013 13:57   Dg Chest Port 1 View  10/10/2013   CLINICAL DATA:  Edema.  EXAM: PORTABLE CHEST - 1 VIEW  COMPARISON:  Chest radiograph 10/09/2013  FINDINGS: Right IJ central venous catheter tip projects over the superior cavoatrial junction. Interval extubation. Removal of the enteric tube. Stable cardiomegaly. Mild bilateral interstitial pulmonary opacities. No definite pleural effusion. Linear radiodensity projecting over the right lung apex is favored to be external to the patient.  IMPRESSION: ET tube is no longer visualized, most compatible with interval extubation.  Cardiomegaly with mild interstitial pulmonary edema.a  Linear radiodensity projecting over the right lung apex is favored to be external to the patient, recommend clinical correlation.   Electronically Signed   By: Lovey Newcomer M.D.   On: 10/10/2013 07:46   Dg Chest Port 1 View  10/09/2013   CLINICAL DATA:  Assess support tube and line placement  EXAM: PORTABLE CHEST - 1 VIEW  COMPARISON:  portable chest x-ray of October 08, 2013  FINDINGS: The lungs are mildly hypoinflated. The interstitial markings remain mildly increased. The cardiac silhouette is normal in size. The pulmonary vascularity is not engorged.  The endotracheal tube lies 4.1 cm above the crotch of the carina. The esophagogastric tube tip in proximal port below project below the hemidiaphragm. The right internal jugular venous catheter tip lies in the region of the distal SVC.  IMPRESSION: There is stable positioning of the support tubes and lines. There has been slight interval improvement in the appearance of the pulmonary interstitium since yesterday's study.   Electronically Signed   By: David  Martinique   On: 10/09/2013 07:48  Review of Systems  Constitutional: Positive for weight loss. Negative for fever.  Respiratory: Negative for shortness of breath.   Cardiovascular: Negative for chest pain.  Gastrointestinal: Negative for nausea and vomiting.  Neurological: Positive for weakness. Negative for headaches.  Psychiatric/Behavioral: Negative for substance abuse.    Blood pressure 123/59, pulse 85, temperature 97.7 F (36.5 C), temperature source Oral, resp. rate 26, height 6' (1.829 m), weight 74.1 kg (163 lb 5.8 oz), SpO2 99.00%. Physical Exam  Constitutional: He appears well-nourished.  Cardiovascular: Normal rate and regular rhythm.   No murmur heard. Respiratory: Effort normal. He has no wheezes.  GI: Soft. There is no tenderness.  Musculoskeletal: Normal range of motion.  Neurological: He is alert.  Pt alert to me Mildly confused  Skin: Skin is warm and dry.  Psychiatric: He has a normal mood and affect. His behavior is normal.  Consented wife      Assessment/Plan Rt renal mass Retroperitoneal mass Adrenal mass Scheduled for  renal mass biopsy Pt and wife aware of procedure Wife understands procedure benefits and risks and agreeable to proceed Consent signed andin chart Has been off Plavix approx 2 weeks    Monette Omara A 10/10/2013, 12:22 PM

## 2013-10-10 NOTE — Sedation Documentation (Signed)
Dr Barbie Banner reviewing case and elevated Troponins, call in to Dr Titus Mould.

## 2013-10-10 NOTE — Consult Note (Signed)
I have been asked to see the patient by Dr. Merrie Roof, MD, for evaluation and management of large right sided retroperitoneal mass.  History of present illness: The very unfortunate 78 year old male with significant comorbidity who ultimately was found to have a large mass emanating from his right kidney and invading into the surrounding structures including the right adrenal gland and encompassing the renal hilum and likely pancreas. The patient's history is complex, but starts approximately 2-1/2 months ago when he developed cardiac arrest. After being stabilized and receiving PCI he developed a hemorrhagic stroke from his anticoagulation. He then was sent to rehabilitation and was readmitted to the ICU with septic shock. A CT scan performed at this time revealed a large renal mass.  The patient relates slight right-sided abdominal pain as well as recent weight loss. He denies any flank pain or gross hematuria. The patient has no family history of kidney cancer or any GU malignancies. Prior to his myocardial infarction the patient was in reasonably good health.  The patient's CT scan was read as a renal cell carcinoma invading into the surrounding structures including the renal vein as well as the adrenal gland.  Review of systems: A 12 point comprehensive review of systems was obtained and is negative unless otherwise stated in the history of present illness. Although, given the patient's circumstances and being in the ICU his history was limited.  Patient Active Problem List   Diagnosis Date Noted  . Septic shock 10/08/2013  . Acute respiratory failure with hypoxia 10/07/2013  . Septic shock(785.52) 10/07/2013  . ICH (intracerebral hemorrhage) 09/25/2013  . Chest pain at rest 09/08/2013  . H/O cardiac arrest July 2015 09/08/2013  . CAD- s/p LAD and CFX stent July 2015 09/08/2013  . Acute CHF 09/08/2013  . Internal carotid artery stenosis   . Hypothyroidism   . Hyperlipidemia   .  Hypertension   . ED (erectile dysfunction)     No current facility-administered medications on file prior to encounter.   Current Outpatient Prescriptions on File Prior to Encounter  Medication Sig Dispense Refill  . acetaminophen (TYLENOL) 325 MG tablet Take 2 tablets (650 mg total) by mouth every 4 (four) hours as needed for headache or mild pain.      Marland Kitchen albuterol (PROVENTIL) (5 MG/ML) 0.5% nebulizer solution Take 2.5 mg by nebulization every 6 (six) hours as needed for wheezing or shortness of breath.      Marland Kitchen aspirin 81 MG chewable tablet Chew 81 mg by mouth daily.      Marland Kitchen atorvastatin (LIPITOR) 80 MG tablet Take 80 mg by mouth every morning.       . clopidogrel (PLAVIX) 75 MG tablet Take 75 mg by mouth daily.      . diphenhydrAMINE (BENADRYL) 25 MG tablet Take 25 mg by mouth at bedtime as needed for sleep.       . furosemide (LASIX) 40 MG tablet Take 1 tablet (40 mg total) by mouth daily.  30 tablet  11  . ipratropium (ATROVENT) 0.02 % nebulizer solution Take 0.5 mg by nebulization every 6 (six) hours as needed for wheezing or shortness of breath.      . isosorbide mononitrate (IMDUR) 60 MG 24 hr tablet Take 60 mg by mouth daily.      . lansoprazole (PREVACID SOLUTAB) 30 MG disintegrating tablet Take 30 mg by mouth daily.      Marland Kitchen levothyroxine (SYNTHROID, LEVOTHROID) 175 MCG tablet Take 175 mcg by mouth daily.      Marland Kitchen  losartan (COZAAR) 25 MG tablet Take 1 tablet (25 mg total) by mouth daily.  30 tablet  11  . metoprolol (LOPRESSOR) 50 MG tablet Take 50 mg by mouth 2 (two) times daily.      . nitroGLYCERIN (NITROSTAT) 0.4 MG SL tablet Place 1 tablet (0.4 mg total) under the tongue every 5 (five) minutes as needed for chest pain.  25 tablet  prn    Past Medical History  Diagnosis Date  . Lumbar stenosis     L5  . Hypothyroidism   . Hyperlipidemia   . Hypertension   . ED (erectile dysfunction)   . Internal carotid artery stenosis 5/15    bilateral, 40-59%  . Myocardial infarction  08/2013  . Heart murmur   . Stroke ~ 2011    "slight; no permanent damage"    Past Surgical History  Procedure Laterality Date  . Tonsillectomy and adenoidectomy    . Appendectomy    . Coronary angioplasty with stent placement  08/2013    "2"  . Cataract extraction, bilateral Bilateral ~ 2012    History  Substance Use Topics  . Smoking status: Never Smoker   . Smokeless tobacco: Never Used  . Alcohol Use: Yes     Comment: 09/08/2013 "couple glasses of wine once/month"    Family History  Problem Relation Age of Onset  . Colon cancer Sister     colon    PE: Filed Vitals:   10/10/13 1800 10/10/13 1900 10/10/13 1927 10/10/13 2000  BP: 114/58 121/60  118/69  Pulse: 87 89  93  Temp:   97.7 F (36.5 C)   TempSrc:   Oral   Resp: 18 22  23   Height:      Weight:      SpO2: 98% 98%  98%   Patient appears to be in no acute distress - patient is alert and oriented x3 Atraumatic normocephalic head No cervical or supraclavicular lymphadenopathy appreciated Patient is short of breath and has increased work of breathing. Regular sinus rhythm/rate Abdomen is soft, with tenderness in the right upper quadrant, nondistended, no CVA or suprapubic tenderness Lower extremities are symmetric without appreciable edema Grossly neurologically intact No identifiable skin lesions   Recent Labs  10/08/13 0630 10/09/13 0500 10/10/13 0059  WBC 19.8* 11.2* 9.5  HGB 10.9* 9.9* 9.3*  HCT 33.8* 31.5* 29.6*    Recent Labs  10/08/13 0630 10/09/13 0500 10/10/13 0059  NA 132* 134* 133*  K 4.1 4.1 3.7  CL 98 102 101  CO2 19 19 20   GLUCOSE 84 127* 105*  BUN 34* 26* 22  CREATININE 1.65* 1.18 0.98  CALCIUM 8.8 8.1* 8.4    Recent Labs  10/09/13 0500  INR 1.49   No results found for this basename: LABURIN,  in the last 72 hours Results for orders placed during the hospital encounter of 10/07/13  CULTURE, RESPIRATORY (NON-EXPECTORATED)     Status: None   Collection Time    10/07/13  10:10 PM      Result Value Ref Range Status   Specimen Description TRACHEAL ASPIRATE   Final   Special Requests NONE   Final   Gram Stain     Final   Value: FEW WBC PRESENT,BOTH PMN AND MONONUCLEAR     RARE SQUAMOUS EPITHELIAL CELLS PRESENT     RARE YEAST     Performed at [facility]   Culture     Final   Value: MODERATE CANDIDA ALBICANS  Performed at [facility]   Report Status 10/10/2013 FINAL   Final    Imaging: 7.4 cm enhancing mass is seen involving the right kidney consistent  with renal cell carcinoma. It may evolve the renal vein in the  hilum, but does not appear to extend significantly centrally. 3.7 cm  right adrenal mass is noted consistent with metastatic disease.  Large multiloculated mass is noted in the right retroperitoneal  region measuring 10 x 5.9 cm consistent with metastatic disease.   Imp: Patient has what appears to be a metastatic renal cell carcinoma. However, and given the invasion into the surrounding structures as well as the adjacent organs I think it's necessary to biopsy this mass to ensure that it in fact is emanating from the kidney and it is renal cell carcinoma. Alternatively, this could be a transitional cell carcinoma. Either way, the mass is too extensive and involves too many surrounding structures to be resectable. However, a tissue diagnosis will be helpful in determining a appropriate palliative chemotherapy if the patient so desires.  Recommendations: I had an extensive discussion with the patient's wife and his cousin about the natural history of renal cell carcinoma and the extent of the patient's disease, assuming that that's truly what this is. Further, I explained to them that given the extent of the patient's tumor with involvement of the surrounding structures that it was unresectable. Further, I explained to them that this represented a very aggressive tumor and was not curative. I explained to them that there may be  some palliative chemotherapy options but I think in order to get a true diagnosis and appropriate palliative chemotherapy a confirmatory biopsy would be helpful. I reached out to interventional radiology who looked at the films and question whether or not a biopsy was truly necessary. I will defer to oncology as to whether or not they feel a tissue diagnosis would be helpful in helping to establish a diagnosis and a appropriate chemotherapy regimen. Unfortunately, there is nothing else that I can do for this patient. However, I will remain available for questions regarding the disease process or if any other circumstance arises in which I can be of assistance.  Thank you for involving me in this patient's care, please page with any further questions or concerns. Louis Meckel W

## 2013-10-10 NOTE — Progress Notes (Signed)
NUTRITION FOLLOW-UP  DOCUMENTATION CODES Per approved criteria  -Severe malnutrition in the context of acute illness   Pt meets criteria for severe MALNUTRITION in the context of acute illness as evidenced by moderate subcutaneous fat and severe muscle depletion.  INTERVENTION: Supplement diet as appropriate  NUTRITION DIAGNOSIS: Inadequate oral intake related to inability to eat as evidenced by NPO status; ongoing.   Goal: Pt to meet >/= 90% of their estimated nutrition needs; not met.   Monitor:  TF regimen & tolerance, respiratory status, weight, labs, I/O's   ASSESSMENT: 78 year old male with recent cardiac arrest 2nd to MI, and cerebellar hemorrhage. In inpatient rehab, he developed fevers and lethargy. 9/1 readmitted to NICU for presumed septic shock, resp failure, fever 103.  Pt extubated 9/3, he failed swallow eval on this date. Swallow eval pending for today. Per SLP she believes she will get pt on a modified diet today, pt does have a hx of dysphagia.  Pt discussed during ICU rounds and with RN.  Pt with suspected new metastatic cancer by CT.   Height: Ht Readings from Last 1 Encounters:  10/07/13 6' (1.829 m)    Weight: Wt Readings from Last 1 Encounters:  10/10/13 163 lb 5.8 oz (74.1 kg)  9/1 171 lb    BMI:  Body mass index is 22.15 kg/(m^2).  Estimated Nutritional Needs: Kcal: 2000-2300 Protein: 115-125g Fluid: 2 L/ day  Skin: scratches, ecchymosis  Diet Order: NPO    Intake/Output Summary (Last 24 hours) at 10/10/13 1100 Last data filed at 10/10/13 1000  Gross per 24 hour  Intake    310 ml  Output   1095 ml  Net   -785 ml    Last BM: 9/1   Labs:   Recent Labs Lab 10/08/13 0630 10/09/13 0500 10/10/13 0059  NA 132* 134* 133*  K 4.1 4.1 3.7  CL 98 102 101  CO2 _0 BUN 34* 26* 22  CREATININE 1.65* 1.18 0.98  CALCIUM 8.8 8.1* 8.4  GLUCOSE 84 127* 105*    CBG (last 3)   Recent Labs  10/09/13 2338 10/10/13 0343  10/10/13 0804  GLUCAP 98 96 101*    Scheduled Meds: . antiseptic oral rinse  7 mL Mouth Rinse QID  . aspirin  81 mg Oral Daily  . atorvastatin  80 mg Oral QPM  . cefTAZidime (FORTAZ)  IV  1 g Intravenous 3 times per day  . chlorhexidine  15 mL Mouth Rinse BID  . feeding supplement (PRO-STAT SUGAR FREE 64)  30 mL Oral BID  . furosemide  40 mg Intravenous Q12H  . heparin  5,000 Units Subcutaneous 3 times per day  . isosorbide mononitrate  30 mg Oral Daily  . levothyroxine  175 mcg Oral QAC breakfast  . metoprolol tartrate  12.5 mg Oral BID  . pantoprazole (PROTONIX) IV  40 mg Intravenous Q24H    Continuous Infusions: . sodium chloride 10 mL/hr at 10/10/13 1000  . feeding supplement (VITAL 1.5 CAL) Stopped (10/09/13 0815)  . fentaNYL infusion INTRAVENOUS Stopped (10/09/13 0830)   [place], [place], [place] Pager [phone] After Hours Pager

## 2013-10-10 NOTE — Progress Notes (Signed)
SLP Cancellation Note  Patient Details Name: Taylor Keller MRN: [id number] DOB: [birth year]   Cancelled treatment:       Reason Eval/Treat Not Completed: Medical issues which prohibited therapy. SLP provided set-up for oral care and administered two ice chips with immediate cough noted. At that time, Dr. Louis Meckel arrived with orders to remain NPO pending biopsy to be done this afternoon. SLP left pager number for RN to please contact SLP after biopsy for diagnostic POs in order to determine least restrictive PO diet. Per MD, sedation for procedure will be mild. Will continue to follow as able.    [name], M.A. CCC-SLP [phone]  [name] 10/10/2013, 11:28 AM

## 2013-10-10 NOTE — Progress Notes (Signed)
*  PRELIMINARY RESULTS* Echocardiogram 2D Echocardiogram has been performed.  Leavy Cella 10/10/2013, 12:38 PM

## 2013-10-11 ENCOUNTER — Encounter (HOSPITAL_COMMUNITY): Payer: Self-pay | Admitting: *Deleted

## 2013-10-11 ENCOUNTER — Inpatient Hospital Stay (HOSPITAL_COMMUNITY): Payer: Medicare HMO

## 2013-10-11 LAB — PROTIME-INR
INR: 1.32 (ref 0.00–1.49)
Prothrombin Time: 16.4 seconds — ABNORMAL HIGH (ref 11.6–15.2)

## 2013-10-11 LAB — BASIC METABOLIC PANEL
Anion gap: 15 (ref 5–15)
BUN: 19 mg/dL (ref 6–23)
CHLORIDE: 97 meq/L (ref 96–112)
CO2: 24 mEq/L (ref 19–32)
CREATININE: 1.03 mg/dL (ref 0.50–1.35)
Calcium: 8.8 mg/dL (ref 8.4–10.5)
GFR calc non Af Amer: 68 mL/min — ABNORMAL LOW (ref >90)
GFR, EST AFRICAN AMERICAN: 79 mL/min — AB (ref >90)
Glucose, Bld: 93 mg/dL (ref 70–99)
POTASSIUM: 3.2 meq/L — AB (ref 3.7–5.3)
Sodium: 136 mEq/L — ABNORMAL LOW (ref 137–147)

## 2013-10-11 LAB — MAGNESIUM: Magnesium: 1.9 mg/dL (ref 1.5–2.5)

## 2013-10-11 LAB — PHOSPHORUS: Phosphorus: 3.3 mg/dL (ref 2.3–4.6)

## 2013-10-11 LAB — APTT: APTT: 37 s (ref 24–37)

## 2013-10-11 MED ORDER — METOPROLOL TARTRATE 12.5 MG HALF TABLET
12.5000 mg | ORAL_TABLET | Freq: Two times a day (BID) | ORAL | Status: DC
Start: 2013-10-11 — End: 2013-10-21
  Administered 2013-10-11 – 2013-10-21 (×19): 12.5 mg
  Filled 2013-10-11 (×23): qty 1

## 2013-10-11 MED ORDER — PRO-STAT SUGAR FREE PO LIQD
30.0000 mL | Freq: Two times a day (BID) | ORAL | Status: DC
  Administered 2013-10-11 – 2013-10-13 (×4): 30 mL
  Filled 2013-10-11 (×5): qty 30

## 2013-10-11 MED ORDER — LEVOTHYROXINE SODIUM 175 MCG PO TABS
175.0000 ug | ORAL_TABLET | Freq: Every day | ORAL | Status: DC
  Administered 2013-10-12 – 2013-10-21 (×10): 175 ug
  Filled 2013-10-11 (×14): qty 1

## 2013-10-11 MED ORDER — FENTANYL CITRATE 0.05 MG/ML IJ SOLN: 50.0000 ug | INTRAMUSCULAR | Status: DC | PRN

## 2013-10-11 MED ORDER — JEVITY 1.2 CAL PO LIQD
1000.0000 mL | ORAL | Status: DC
  Administered 2013-10-11: 1000 mL
  Administered 2013-10-13: 06:00:00
  Filled 2013-10-11 (×6): qty 1000

## 2013-10-11 MED ORDER — NITROGLYCERIN 2 % TD OINT
0.5000 [in_us] | TOPICAL_OINTMENT | Freq: Four times a day (QID) | TRANSDERMAL | Status: DC
  Administered 2013-10-11 – 2013-10-21 (×40): 0.5 [in_us] via TOPICAL
  Filled 2013-10-11: qty 30

## 2013-10-11 MED ORDER — ASPIRIN 81 MG PO CHEW
81.0000 mg | CHEWABLE_TABLET | Freq: Every day | ORAL | Status: DC
  Administered 2013-10-12 – 2013-10-21 (×9): 81 mg
  Filled 2013-10-11 (×10): qty 1

## 2013-10-11 MED ORDER — POTASSIUM CHLORIDE 10 MEQ/100ML IV SOLN
10.0000 meq | INTRAVENOUS | Status: AC
  Administered 2013-10-11 – 2013-10-12 (×4): 10 meq via INTRAVENOUS
  Filled 2013-10-11 (×3): qty 100

## 2013-10-11 MED ORDER — VITAL 1.5 CAL PO LIQD: 1000.0000 mL | ORAL | Status: DC

## 2013-10-11 MED ORDER — GADOBENATE DIMEGLUMINE 529 MG/ML IV SOLN
15.0000 mL | Freq: Once | INTRAVENOUS | Status: AC | PRN
  Administered 2013-10-11: 15 mL via INTRAVENOUS

## 2013-10-11 MED ORDER — ATORVASTATIN CALCIUM 80 MG PO TABS
80.0000 mg | ORAL_TABLET | Freq: Every evening | ORAL | Status: DC
  Administered 2013-10-11 – 2013-10-20 (×11): 80 mg
  Filled 2013-10-11 (×11): qty 1

## 2013-10-11 NOTE — Progress Notes (Signed)
Pt had a 12 beat run of Vtach, called Elink MD to make aware. MD confirmed that pt has done this before and wanted to ensure that pt received beta blocker, advised RN to call back if V-tach reoccurs.   Pt received his scheduled dose of metoprolol (12.5 mg) at 22:58 on 10/10/13.  Taylor Keller

## 2013-10-11 NOTE — Evaluation (Addendum)
Clinical/Bedside Swallow Evaluation Patient Details  Name: Taylor Keller MRN: [id number] Date of Birth: [birth year]  Today's Date: 10/11/2013 Time: 0910-0928 SLP Time Calculation (min): 18 min  Past Medical History:  Past Medical History  Diagnosis Date  . Lumbar stenosis     L5  . Hypothyroidism   . Hyperlipidemia   . Hypertension   . ED (erectile dysfunction)   . Internal carotid artery stenosis 5/15    bilateral, 40-59%  . Myocardial infarction 08/2013  . Heart murmur   . Stroke ~ 2011    "slight; no permanent damage"   Past Surgical History:  Past Surgical History  Procedure Laterality Date  . Tonsillectomy and adenoidectomy    . Appendectomy    . Coronary angioplasty with stent placement  08/2013    "2"  . Cataract extraction, bilateral Bilateral ~ 2012   HPI:  78 yo M with cerebellar hemorrhage. MRI shows 2.6 x 1.9 cm hematoma in the inferior right cerebellum. Pt also has a history of recent MI and cardiac arrest, intubated 7 days, who was discharged from [facility] general hospital on 7/31,  Pt was d/c'd home on Honey thick liquids. Readmitted to [facility] with CHF.  Found to have dysphagia with silent aspiration s/p extubation.but underwent an MBS on 8/3 showing mild sensori-motor pharyngeal dysphagia, characterized by decreased base of tongue contraction to the pharyngeal wall and decreased laryngeal elevation and epiglottic deflection. Pt recommended to consume a dys 3 diet with thin liquids with a chin tuck and second swallow to clear residue. Pt was progressing well on CIR until he became septic and was transferred back to acute care with two-day intubation. SLP was ordered to assess readiness to restart PO diet. Pt showed s/s of aspiration with BSE on 9/3 and remained NPO.   Assessment / Plan / Recommendation Clinical Impression  Dysphagia treatment today provided for PO readiness following 2-day intubation (extubated 9/3). Pt showed consistent s/s of aspiration with thin  and nectar-thick liquid consistencies- cough or throat clear. Suspect that pt presents with delayed oral transit and continued decreased laryngeal elevation as evidenced on MBS 8/3. Previous MBS recommendation was dysphagia 3/ thin with chin tuck- attempted chin tuck with nectar thick liquids and pt continued to consistently demonstrate immediate throat clear. Given these findings combined with recent extubation, pt is at high risk of aspiration. Recommend continuing NPO status with meds crushed in puree, further recommendations pending MBS later this AM.     Aspiration Risk   Severe    Diet Recommendation  NPO   Medication Administration: Crushed with puree Compensations: Slow rate;Small sips/bites (with meds) Postural Changes and/or Swallow Maneuvers: Seated upright 90 degrees    Other  Recommendations Oral Care Recommendations: Oral care Q4 per protocol   Follow Up Recommendations  Inpatient Rehab    Frequency and Duration        Pertinent Vitals/Pain None stated    SLP Swallow Goals     Swallow Study Prior Functional Status       General HPI: 78 yo M with cerebellar hemorrhage. MRI shows 2.6 x 1.9 cm hematoma in the inferior right cerebellum. Pt also has a history of recent MI and cardiac arrest, intubated 7 days, who was discharged from [facility] general hospital on 7/31,  Pt was d/c'd home on Honey thick liquids. Readmitted to [facility] with CHF.  Found to have dysphagia with silent aspiration s/p extubation.but underwent an MBS on 8/3 showing mild sensori-motor pharyngeal dysphagia, characterized by decreased base of tongue  contraction to the pharyngeal wall and decreased laryngeal elevation and epiglottic deflection. Pt recommended to consume a dys 3 diet with thin liquids with a chin tuck and second swallow to clear residue. Pt was progressing well on CIR until he became septic and was transferred back to acute care with two-day intubation. SLP was ordered to assess readiness to  restart PO diet. Pt showed s/s of aspiration with BSE on 9/3 and remained NPO. Temperature Spikes Noted: No Respiratory Status: Room air Behavior/Cognition: Alert;Cooperative Oral Cavity - Dentition: Adequate natural dentition Patient Positioning: Upright in chair    Oral/Motor/Sensory Function     Ice Chips     Thin Liquid      Nectar Thick     Honey Thick     Puree     Solid   GO            Ival Basquez K, MA, CCC-SLP 10/11/2013,9:32 AM

## 2013-10-11 NOTE — Significant Event (Signed)
1445pm-Patient has been safely transferred to 3S01, via bed, on room air. VS stable prior and during the transfer. All personal belongings (wedding ring, eye-glasses, and shawl) are wore by patient. Dr. Thereasa Solo at bedside at 3S, patient's spouse escorted to the patient's room. Report given to receiving RN Leana Roe. Fynn Vanblarcom, Therapist, sports.

## 2013-10-11 NOTE — Procedures (Addendum)
Objective Swallowing Evaluation: Modified Barium Swallowing Study  Patient Details  Name: Taylor Keller MRN: [id number] Date of Birth: September 29, 1935  Today's Date: 10/11/2013 Time: 6294-7654 SLP Time Calculation (min): 23 min  Past Medical History:  Past Medical History  Diagnosis Date  . Lumbar stenosis     L5  . Hypothyroidism   . Hyperlipidemia   . Hypertension   . ED (erectile dysfunction)   . Internal carotid artery stenosis 5/15    bilateral, 40-59%  . Myocardial infarction 08/2013  . Heart murmur   . Stroke ~ 2011    "slight; no permanent damage"   Past Surgical History:  Past Surgical History  Procedure Laterality Date  . Tonsillectomy and adenoidectomy    . Appendectomy    . Coronary angioplasty with stent placement  08/2013    "2"  . Cataract extraction, bilateral Bilateral ~ 2012   HPI:  78 yo M with cerebellar hemorrhage. MRI shows 2.6 x 1.9 cm hematoma in the inferior right cerebellum. Pt also has a history of recent MI and cardiac arrest, intubated 7 days, who was discharged from [facility] general hospital on 7/31,  Pt was d/c'd home on Honey thick liquids. Readmitted to [facility] with CHF.  Found to have dysphagia with silent aspiration s/p extubation.but underwent an MBS on 8/3 showing mild sensori-motor pharyngeal dysphagia, characterized by decreased base of tongue contraction to the pharyngeal wall and decreased laryngeal elevation and epiglottic deflection. Pt recommended to consume a dys 3 diet with thin liquids with a chin tuck and second swallow to clear residue. Pt was progressing well on CIR until he became septic and was transferred back to acute care with two-day intubation. SLP was ordered to assess readiness to restart PO diet. Pt showed s/s of aspiration with BSE on 9/3 and remained NPO.     Assessment / Plan / Recommendation Clinical Impression  Dysphagia Diagnosis: Mild oral phase dysphagia;Severe pharyngeal phase dysphagia Clinical impression: Pt.'s  swallow function has decreased from [facility] 09/08/13.  Significantly reduced tongue base retraction, laryngeal elevation and epiglottic deflection led to moderate-severe silent aspiration during and after the swallow from vallecular and pyriform sinus residue.  Chin tuck posture and cues to cough were unsuccessful in mitigating and removing aspiration.  Pt. not safe with any po consistency at this time, discussed with MD temporary means of nutrition; continue oral care and ST for trial pharyngeal strengthening exercises due to motor weakness.    Treatment Recommendation  Therapy as outlined in treatment plan below    Diet Recommendation NPO   Medication Administration: Crushed with puree Compensations: Slow rate;Small sips/bites (with meds) Postural Changes and/or Swallow Maneuvers: Seated upright 90 degrees    Other  Recommendations Oral Care Recommendations: Oral care BID   Follow Up Recommendations   (TBD)    Frequency and Duration min 2x/week  2 weeks   Pertinent Vitals/Pain WDL          Reason for Referral Objectively evaluate swallowing function   Oral Phase Oral Preparation/Oral Phase Oral Phase: Impaired Oral - Honey Oral - Honey Teaspoon: Delayed oral transit Oral - Honey Cup: Delayed oral transit Oral - Solids Oral - Puree: Delayed oral transit   Pharyngeal Phase Pharyngeal Phase Pharyngeal Phase: Impaired Pharyngeal - Honey Pharyngeal - Honey Teaspoon: Penetration/Aspiration during swallow;Reduced laryngeal elevation;Reduced airway/laryngeal closure;Reduced tongue base retraction;Pharyngeal residue - valleculae;Pharyngeal residue - pyriform sinuses;Moderate aspiration Penetration/Aspiration details (honey teaspoon): Material enters airway, passes BELOW cords without attempt by patient to eject out (silent aspiration) Pharyngeal -  Honey Cup: Penetration/Aspiration during swallow;Reduced laryngeal elevation;Reduced airway/laryngeal closure;Reduced tongue base  retraction;Pharyngeal residue - valleculae;Pharyngeal residue - pyriform sinuses;Moderate aspiration Penetration/Aspiration details (honey cup): Material enters airway, passes BELOW cords without attempt by patient to eject out (silent aspiration) Pharyngeal - Solids Pharyngeal - Puree: Pharyngeal residue - valleculae;Penetration/Aspiration during swallow;Reduced tongue base retraction;Pharyngeal residue - pyriform sinuses;Reduced laryngeal elevation Penetration/Aspiration details (puree): Material enters airway, remains ABOVE vocal cords and not ejected out  Cervical Esophageal Phase    GO    Cervical Esophageal Phase Cervical Esophageal Phase: Taylor Keller         Taylor Keller 10/11/2013, 12:14 PM Taylor Keller Taylor Keller.Ed [employer] [phone]

## 2013-10-11 NOTE — Significant Event (Signed)
Patient taken to radiology for modified barium swallowing study, then taken to MRI. VS stable. See SP for their report on swallowing test. Mussa Groesbeck, RN.

## 2013-10-11 NOTE — Progress Notes (Signed)
[facility] TEAM 1 - Stepdown/ICU TEAM Progress Note  Taylor Keller LNL:[id number] DOB: [birth year] DOA: 10/07/2013 PCP: Odette Fraction, MD  Admit HPI / Brief Narrative: 78 year old male with cardiac arrest 2nd to MI in July 2015 (tx at outside hospital), and cerebellar hemorrhage 09/25/2013, the latter of which he was discharged for to inpatient rehab 8/25. While there, he developed fevers and lethargy. Medical staff there was concerned for sepsis, PCCM called to readmit on 10/07/2013.    Significant Events:  8/25 - discharged from NICU to inpatient rehab  8/31 - CT head - No new intracranial hemorrhage 9/1 - readmitted to NICU for presumed septic shock, resp failure, fever 103 - intubated  9/2 - improved on pressors  9/2 EEG > generalized irregular slow activity  9/2 [place] abdo - Multiple gallstones. 4.2 cm right renal mass suspicious for renal cell carcinoma. Two large solid retroperitoneal mass lesions, the largest measures 9.6 cm in diameter. Metastatic disease, primary  retroperitoneal tumor, and lymphoma could present in this fashion.  9/3 - improved neurostatus - extubated  9/3 CT - 7.4 cm enhancing mass is seen involving the right kidney consistent with renal cell carcinoma. It may involve the renal vein in the hilum, but does not appear to extend significantly centrally. 3.7 cm right adrenal mass is noted consistent with metastatic disease. Large multiloculated mass is noted in the right retroperitoneal region measuring 10 x 5.9 cm consistent with metastatic disease.   HPI/Subjective: Pt has no new complaints at this time.  He denies cp, ha, n//v, or abdom pain.    Assessment/Plan:  R Renal mass - renal cell CA confirmed with [place] and CT - likely mets to adrenal gland and retroperitoneum - Urology feels they have little to offer and that surgery is not indicated - Oncology consult pending, but prelim note suggets bx WILL be needed and suggests there may be a role for resection - CT  of the chest is negative for pulmonary metastases - await formal consult from Onc  Septic shock - unclear etiology UA negative 8/31 but 21-50 WBC on 9/2 - urine culture negative - blood culture apparently "cancelled" - CXR not c/w pneumonia or aspiration - continue empiric abx for 7 days of tx   Stress cardiac ischemia - CAD s/p cardiac arrest July 2015 s/p DES x2 V fib cardiac arrest on 08/21/2013 in [place] CPR for several minutes - cardiac cath on 08/25/2013 and received drug-eluting stent to LAD and another drug-eluting stent to left circumflex artery - residual 50% stenosis in the RCA - patient was discharged on 09/05/2013 - plavix to be resumed once bx obtained   Acute resp failure in setting of septic shock  Intubated 9/1 - extubated 9/3 - resp status stable    Acute encephalopathy  secondary to hypotension in setting carotid dz - mental status much improved   Dysphagia  Was on honey thick liquid after d/c from [place] [facility] July 2015 - MBS 8/3 resulted in rec for D3 w/ thin w/ chin tuck - unfortunately f/u MBS today suggests NPO status only - place feeding tube to begin tube feeds   Recent R cerebellar bleed without hydrocephalus Due to malignant HTN in setting of ASA/Plavix - stable at this time   Hypovolemia, hyponatremia  Resolving w/ IVF resuscitation  Hypokalemia  Replace and follow trend - due to limited intake - Mg normal   Anemia - chronic  Coagulapathy Due to sepsis? - resolved  Hypothyroid On synthroid -  TSH 4.173  Severe malnutrition in the context of acute illness  Code Status: FULL Family Communication: spoke w/ pt and wife at length  Disposition Plan: SDU   Consultants: Urology Oncology PCCM Neurology  Antibiotics: ceftaz 9/02 >>  DVT prophylaxis: SCDs  Objective: Blood pressure 127/68, pulse 83, temperature 98.1 F (36.7 C), temperature source Oral, resp. rate 24, height 6' (1.829 m), weight 73 kg (160 lb 15 oz), SpO2  99.00%.  Intake/Output Summary (Last 24 hours) at 10/11/13 1009 Last data filed at 10/11/13 1000  Gross per 24 hour  Intake    415 ml  Output   3380 ml  Net  -2965 ml   Exam: General: No acute respiratory distress - cachectic Lungs: Clear to auscultation bilaterally without wheezes or crackles Cardiovascular: Regular rate and rhythm without murmur gallop or rub normal S1 and S2 Abdomen: Nontender, nondistended, soft, bowel sounds positive, no rebound, no ascites, no appreciable mass Extremities: No significant cyanosis, clubbing, or edema bilateral lower extremities  Data Reviewed: Basic Metabolic Panel:  Recent Labs Lab 10/07/13 1929 10/08/13 0630 10/09/13 0500 10/10/13 0059 10/11/13 0445  NA 131* 132* 134* 133* 136*  K 4.4 4.1 4.1 3.7 3.2*  CL 96 98 102 101 97  CO2 16* 19 19 20 24   GLUCOSE 206* 84 127* 105* 93  BUN 34* 34* 26* 22 19  CREATININE 2.15* 1.65* 1.18 0.98 1.03  CALCIUM 8.9 8.8 8.1* 8.4 8.8  MG  --   --   --   --  1.9  PHOS  --   --   --   --  3.3   Liver Function Tests:  Recent Labs Lab 10/07/13 1834 10/07/13 1929 10/09/13 0500 10/10/13 0059  AST 21 24 24 21   ALT 20 19 18 15   ALKPHOS 59 60  60 54 53  BILITOT 0.4 0.5 0.3 0.4  PROT 7.7 7.6 6.4 6.2  ALBUMIN 2.6* 2.6* 1.9* 1.9*    Recent Labs Lab 10/07/13 1929  LIPASE 36  AMYLASE 60   Coags:  Recent Labs Lab 10/07/13 1929 10/09/13 0500  INR 1.62* 1.49   CBC:  Recent Labs Lab 10/06/13 0954 10/07/13 1834 10/07/13 1929 10/08/13 0630 10/09/13 0500 10/10/13 0059  WBC 9.4 20.5* 19.1* 19.8* 11.2* 9.5  NEUTROABS 8.0* 18.9*  --   --  10.1* 8.3*  HGB 11.8* 11.0* 10.9* 10.9* 9.9* 9.3*  HCT 37.0* 34.6* 33.7* 33.8* 31.5* 29.6*  MCV 83.5 85.6 83.0 82.4 86.1 85.8  PLT 353 394 362 304 228 226    Cardiac Enzymes:  Recent Labs Lab 10/08/13 0630 10/09/13 0955 10/09/13 1244 10/09/13 1940 10/10/13 0059  CKTOTAL  --   --  52 49 42  CKMB  --   --  6.4* 7.1* 5.5*  TROPONINI 1.00* 1.41*  1.77* 1.91* 1.61*   BNP (last 3 results)  Recent Labs  09/08/13 0525 09/10/13 0419  PROBNP 15176.0* 9146.0*   CBG:  Recent Labs Lab 10/09/13 1536 10/09/13 1950 10/09/13 2338 10/10/13 0343 10/10/13 0804  GLUCAP 105* 95 98 96 101*    Recent Results (from the past 240 hour(s))  URINE CULTURE     Status: None   Collection Time    10/06/13  5:21 PM      Result Value Ref Range Status   Specimen Description URINE, CATHETERIZED   Final   Special Requests septra   Final   Culture  Setup Time     Final   Value: 10/06/2013 18:37  Performed at [facility]     Final   Value: NO GROWTH     Performed at [facility]   Culture     Final   Value: NO GROWTH     Performed at [facility]   Report Status 10/07/2013 FINAL   Final  CULTURE, BLOOD (ROUTINE X 2)     Status: None   Collection Time    10/06/13  6:41 PM      Result Value Ref Range Status   Specimen Description BLOOD LEFT ARM   Final   Special Requests     Final   Value: BOTTLES DRAWN AEROBIC AND ANAEROBIC 10CC PT ON SEPTRA   Culture  Setup Time     Final   Value: 10/07/2013 00:25     Performed at [facility]   Culture     Final   Value:        BLOOD CULTURE RECEIVED NO GROWTH TO DATE CULTURE WILL BE HELD FOR 5 DAYS BEFORE ISSUING A FINAL NEGATIVE REPORT     Performed at [facility]   Report Status PENDING   Incomplete  CULTURE, BLOOD (ROUTINE X 2)     Status: None   Collection Time    10/06/13  6:47 PM      Result Value Ref Range Status   Specimen Description BLOOD LEFT RIGHT ARM   Final   Special Requests BOTTLES DRAWN AEROBIC ONLY 3CC   Final   Culture  Setup Time     Final   Value: 10/07/2013 00:25     Performed at [facility]   Culture     Final   Value:        BLOOD CULTURE RECEIVED NO GROWTH TO DATE CULTURE WILL BE HELD FOR 5 DAYS BEFORE ISSUING A FINAL NEGATIVE REPORT     Performed at [facility]   Report Status PENDING    Incomplete  CULTURE, BLOOD (ROUTINE X 2)     Status: None   Collection Time    10/07/13  7:49 PM      Result Value Ref Range Status   Specimen Description BLOOD LEFT A-LINE   Final   Special Requests BOTTLES DRAWN AEROBIC AND ANAEROBIC 10CC   Final   Culture  Setup Time     Final   Value: 10/08/2013 01:08     Performed at [facility]   Culture     Final   Value:        BLOOD CULTURE RECEIVED NO GROWTH TO DATE CULTURE WILL BE HELD FOR 5 DAYS BEFORE ISSUING A FINAL NEGATIVE REPORT     Performed at [facility]   Report Status PENDING   Incomplete  CULTURE, BLOOD (ROUTINE X 2)     Status: None   Collection Time    10/07/13  9:18 PM      Result Value Ref Range Status   Specimen Description BLOOD RIGHT UPPER WRIST   Final   Special Requests BOTTLES DRAWN AEROBIC ONLY 3CC   Final   Culture  Setup Time     Final   Value: 10/08/2013 01:09     Performed at [facility]   Culture     Final   Value:        BLOOD CULTURE RECEIVED NO GROWTH TO DATE CULTURE WILL BE HELD FOR 5 DAYS BEFORE ISSUING A FINAL NEGATIVE REPORT     Performed  at [facility]   Report Status PENDING   Incomplete  CULTURE, RESPIRATORY (NON-EXPECTORATED)     Status: None   Collection Time    10/07/13 10:10 PM      Result Value Ref Range Status   Specimen Description TRACHEAL ASPIRATE   Final   Special Requests NONE   Final   Gram Stain     Final   Value: FEW WBC PRESENT,BOTH PMN AND MONONUCLEAR     RARE SQUAMOUS EPITHELIAL CELLS PRESENT     RARE YEAST     Performed at [facility]   Culture     Final   Value: MODERATE CANDIDA ALBICANS     Performed at [facility]   Report Status 10/10/2013 FINAL   Final     Studies:  Recent x-ray studies have been reviewed in detail by the Attending Physician  Scheduled Meds:  Scheduled Meds: . antiseptic oral rinse  7 mL Mouth Rinse QID  . aspirin  81 mg Oral Daily  . atorvastatin  80 mg Oral QPM  . cefTAZidime (FORTAZ)   IV  1 g Intravenous 3 times per day  . chlorhexidine  15 mL Mouth Rinse BID  . clopidogrel  75 mg Oral Daily  . feeding supplement (PRO-STAT SUGAR FREE 64)  30 mL Oral BID  . furosemide  40 mg Intravenous Q12H  . heparin  5,000 Units Subcutaneous 3 times per day  . isosorbide mononitrate  30 mg Oral Daily  . levothyroxine  175 mcg Oral QAC breakfast  . metoprolol tartrate  12.5 mg Oral BID  . pantoprazole (PROTONIX) IV  40 mg Intravenous Q24H    Time spent on care of this patient: 35 mins   MCCLUNG,JEFFREY T , MD   [facility] Office  [phone] Pager - Text Page per Shea Evans as per below:  On-Call/Text Page:      [email or web address]      password [email or web address]  If 7PM-7AM, please contact night-coverage [email or web address] Password [email or web address] 10/11/2013, 10:09 AM   LOS: 4 days

## 2013-10-12 ENCOUNTER — Inpatient Hospital Stay (HOSPITAL_COMMUNITY): Payer: Medicare HMO

## 2013-10-12 LAB — COMPREHENSIVE METABOLIC PANEL
ALBUMIN: 2.2 g/dL — AB (ref 3.5–5.2)
ALK PHOS: 59 U/L (ref 39–117)
ALT: 13 U/L (ref 0–53)
AST: 15 U/L (ref 0–37)
Anion gap: 14 (ref 5–15)
BUN: 25 mg/dL — ABNORMAL HIGH (ref 6–23)
CHLORIDE: 98 meq/L (ref 96–112)
CO2: 25 mEq/L (ref 19–32)
Calcium: 8.7 mg/dL (ref 8.4–10.5)
Creatinine, Ser: 1.08 mg/dL (ref 0.50–1.35)
GFR calc Af Amer: 75 mL/min — ABNORMAL LOW (ref >90)
GFR calc non Af Amer: 65 mL/min — ABNORMAL LOW (ref >90)
Glucose, Bld: 111 mg/dL — ABNORMAL HIGH (ref 70–99)
POTASSIUM: 3.4 meq/L — AB (ref 3.7–5.3)
SODIUM: 137 meq/L (ref 137–147)
TOTAL PROTEIN: 7.1 g/dL (ref 6.0–8.3)
Total Bilirubin: 0.5 mg/dL (ref 0.3–1.2)

## 2013-10-12 LAB — GLUCOSE, CAPILLARY: Glucose-Capillary: 136 mg/dL — ABNORMAL HIGH (ref 70–99)

## 2013-10-12 LAB — CBC
HEMATOCRIT: 29.8 % — AB (ref 39.0–52.0)
Hemoglobin: 9.4 g/dL — ABNORMAL LOW (ref 13.0–17.0)
MCH: 26.5 pg (ref 26.0–34.0)
MCHC: 31.5 g/dL (ref 30.0–36.0)
MCV: 83.9 fL (ref 78.0–100.0)
PLATELETS: 290 10*3/uL (ref 150–400)
RBC: 3.55 MIL/uL — ABNORMAL LOW (ref 4.22–5.81)
RDW: 17.6 % — AB (ref 11.5–15.5)
WBC: 7.8 10*3/uL (ref 4.0–10.5)

## 2013-10-12 LAB — TROPONIN I: TROPONIN I: 0.32 ng/mL — AB (ref <0.30)

## 2013-10-12 MED ORDER — FREE WATER: 100.0000 mL | Freq: Three times a day (TID) | Status: DC

## 2013-10-12 NOTE — Progress Notes (Signed)
Subjective:  1 - Likely Metastatic Kidney Cancer  - Large right renal mass with massive renal hilar adenopathy and ipsilateral adrenal metastasis by CT 10/2013. Complete distortion and encasement of inferior venacava at level of renal vessels and below. No chest lesions. Brain MRI with recent intracranial hemorrhage, cannot yet completely rule out underlying cerebellar mass, though not favored.  Renal mass biopsy pending.  Today Taylor Keller is stable. Dobhoff tube placed for tube feeds as failed swallow study. He remains quite debilitated in ICU.   Objective: Vital signs in last 24 hours: Temp:  [96.5 F (35.8 C)-98.4 F (36.9 C)] 98.4 F (36.9 C) (09/06 0317) Pulse Rate:  [82-96] 96 (09/06 0317) Resp:  [16-24] 23 (09/06 0317) BP: (107-134)/(59-74) 132/63 mmHg (09/06 0317) SpO2:  [91 %-99 %] 96 % (09/06 0317) Weight:  [74 kg (163 lb 2.3 oz)] 74 kg (163 lb 2.3 oz) (09/06 0317) Last BM Date: 10/10/13  Intake/Output from previous day: 09/05 0701 - 09/06 0700 In: 855 [I.V.:120; NG/GT:285; IV Piggyback:450] Out: 2458 [Urine:1165] Intake/Output this shift:    General appearance: alert, cooperative, appears stated age and pt very debillitated and weak, wife at bedside.  Nose: Nares normal. Septum midline. Mucosa normal. No drainage or sinus tenderness., Dobhoff tube exiting nare.  Throat: lips, mucosa, and tongue normal; teeth and gums normal Back: symmetric, no curvature. ROM normal. No CVA tenderness. Resp: non-labored Cardio: Nl rate GI: soft, non-tender; bowel sounds normal; no masses,  no organomegaly Male genitalia: normal, foley c/d/i. Urine clear.  Extremities: extremities normal, atraumatic, no cyanosis or edema Pulses: 2+ and symmetric Skin: Skin color, texture, turgor normal. No rashes or lesions Lymph nodes: Cervical, supraclavicular, and axillary nodes normal. Neurologic: Mental status: Alert, oriented, thought content appropriate  Lab Results:   Recent Labs   10/10/13 0059 10/12/13 0500  WBC 9.5 7.8  HGB 9.3* 9.4*  HCT 29.6* 29.8*  PLT 226 290   BMET  Recent Labs  10/11/13 0445 10/12/13 0500  NA 136* 137  K 3.2* 3.4*  CL 97 98  CO2 24 25  GLUCOSE 93 111*  BUN 19 25*  CREATININE 1.03 1.08  CALCIUM 8.8 8.7   PT/INR  Recent Labs  10/11/13 1647  LABPROT 16.4*  INR 1.32   ABG No results found for this basename: PHART, PCO2, PO2, HCO3,  in the last 72 hours  Studies/Results: Mr Jeri Cos Wo Contrast  10/11/2013   CLINICAL DATA:  Known intracranial hemorrhage. Suspected with renal cell carcinoma. Evaluate for underlying intracranial lesion. Hypertension.  EXAM: MRI HEAD WITHOUT AND WITH CONTRAST  TECHNIQUE: Multiplanar, multiecho pulse sequences of the brain and surrounding structures were obtained without and with intravenous contrast.  CONTRAST:  73mL MULTIHANCE GADOBENATE DIMEGLUMINE 529 MG/ML IV SOLN  COMPARISON:  Several prior examinations, most recent head CT 10/06/2013 and most recent MR 09/25/2013.  FINDINGS: 3.7 x 3.7 x 3.5 cm right cerebellar hematoma has changed characteristics since prior examination now appearing better defined with peripheral hemosiderin rim and surrounding vasogenic edema. Overall size is minimally changed. This continues to cause mass effect upon the fourth ventricle and the outflow of the fourth ventricle with distortion of the right cerebellum, right middle cerebral peduncle, right aspect of the vermis and right medulla. Slight decrease in size of temporal horns.  Breakthrough of hemorrhage into the lateral ventricular system with dependent blood in the left lateral ventricle. Currently, no significant hydrocephalus.  No enhancing lesion is seen separate from the hematoma and it is possible this represents a  hematoma related to patient's anticoagulation. Given the large mass in the patient's abdomen combined with slow clearance of intracranial hemorrhage, follow-up until complete clearance to exclude underlying  intracranial mass (not detected on the current exam) recommended.  No intracranial mass lesion otherwise noted.  No acute thrombotic infarct.  Small right vertebral artery ends in a right posterior inferior cerebellar artery distribution. Major intracranial vascular structures are patent.  Partial opacification mastoid air cells greater on the right.  IMPRESSION: Evolution of the right cerebellar hematoma as detailed above. There is continued mass effect upon adjacent structures although with slight decrease in size of temporal horns.  No enhancing lesion is seen separate from the hematoma and it is possible this represents a hematoma related to patient's anticoagulation. Given the large mass in the patient's abdomen combined with slow clearance of intracranial hemorrhage, follow-up until complete clearance to exclude underlying intracranial mass recommended.   Electronically Signed   By: Chauncey Cruel M.D.   On: 10/11/2013 13:49   Dg Abd Portable 1v  10/11/2013   CLINICAL DATA:  Evaluate NG tube placement  EXAM: PORTABLE ABDOMEN - 1 VIEW  COMPARISON:  None.  FINDINGS: Weighted tip enteric tube projects over the expected location of the gastric fundus.  Residual enteric contrast is seen throughout the colon.  IMPRESSION: Weighted tip enteric tube projects over the gastric fundus.   Electronically Signed   By: Sandi Mariscal M.D.   On: 10/11/2013 18:12   Dg Swallowing Func-speech Pathology  10/11/2013   Orbie Pyo [place], CCC-SLP     10/11/2013  5:22 PM Objective Swallowing Evaluation: Modified Barium Swallowing Study   Patient Details  Name: Taylor Keller MRN: [id number] Date of Birth: Aug 19, 1935  Today's Date: 10/11/2013 Time: 4196-2229 SLP Time Calculation (min): 23 min  Past Medical History:  Past Medical History  Diagnosis Date  . Lumbar stenosis     L5  . Hypothyroidism   . Hyperlipidemia   . Hypertension   . ED (erectile dysfunction)   . Internal carotid artery stenosis 5/15    bilateral, 40-59%  . Myocardial  infarction 08/2013  . Heart murmur   . Stroke ~ 2011    "slight; no permanent damage"   Past Surgical History:  Past Surgical History  Procedure Laterality Date  . Tonsillectomy and adenoidectomy    . Appendectomy    . Coronary angioplasty with stent placement  08/2013    "2"  . Cataract extraction, bilateral Bilateral ~ 2012   HPI:  78 yo M with cerebellar hemorrhage. MRI shows 2.6 x 1.9 cm  hematoma in the inferior right cerebellum. Pt also has a history  of recent MI and cardiac arrest, intubated 7 days, who was  discharged from [facility] on 7/31,  Pt was  d/c'd home on Honey thick liquids. Readmitted to [facility] with CHF.   Found to have dysphagia with silent aspiration s/p extubation.but  underwent an MBS on 8/3 showing mild sensori-motor pharyngeal  dysphagia, characterized by decreased base of tongue contraction  to the pharyngeal wall and decreased laryngeal elevation and  epiglottic deflection. Pt recommended to consume a dys 3 diet  with thin liquids with a chin tuck and second swallow to clear  residue. Pt was progressing well on CIR until he became septic  and was transferred back to acute care with two-day intubation.  SLP was ordered to assess readiness to restart PO diet. Pt showed  s/s of aspiration with BSE on 9/3 and remained NPO.  Assessment / Plan / Recommendation Clinical Impression  Dysphagia Diagnosis: Mild oral phase dysphagia;Severe pharyngeal  phase dysphagia Clinical impression: Pt.'s swallow function has decreased from  [facility] 09/08/13.  Significantly reduced tongue base retraction,  laryngeal elevation and epiglottic deflection led to  moderate-severe silent aspiration during and after the swallow  from vallecular and pyriform sinus residue.  Chin tuck posture  and cues to cough were unsuccessful in mitigating and removing  aspiration.  Pt. not safe with any po consistency at this time,  discussed with MD temporary means of nutrition; continue oral  care and ST for trial  pharyngeal strengthening exercises due to  motor weakness.    Treatment Recommendation  Therapy as outlined in treatment plan below    Diet Recommendation NPO   Medication Administration: Crushed with puree Compensations: Slow rate;Small sips/bites (with meds) Postural Changes and/or Swallow Maneuvers: Seated upright 90  degrees    Other  Recommendations Oral Care Recommendations: Oral care BID   Follow Up Recommendations   (TBD)    Frequency and Duration min 2x/week  2 weeks   Pertinent Vitals/Pain WDL          Reason for Referral Objectively evaluate swallowing function   Oral Phase Oral Preparation/Oral Phase Oral Phase: Impaired Oral - Honey Oral - Honey Teaspoon: Delayed oral transit Oral - Honey Cup: Delayed oral transit Oral - Solids Oral - Puree: Delayed oral transit   Pharyngeal Phase Pharyngeal Phase Pharyngeal Phase: Impaired Pharyngeal - Honey Pharyngeal - Honey Teaspoon: Penetration/Aspiration during  swallow;Reduced laryngeal elevation;Reduced airway/laryngeal  closure;Reduced tongue base retraction;Pharyngeal residue -  valleculae;Pharyngeal residue - pyriform sinuses;Moderate  aspiration Penetration/Aspiration details (honey teaspoon): Material enters  airway, passes BELOW cords without attempt by patient to eject  out (silent aspiration) Pharyngeal - Honey Cup: Penetration/Aspiration during  swallow;Reduced laryngeal elevation;Reduced airway/laryngeal  closure;Reduced tongue base retraction;Pharyngeal residue -  valleculae;Pharyngeal residue - pyriform sinuses;Moderate  aspiration Penetration/Aspiration details (honey cup): Material enters  airway, passes BELOW cords without attempt by patient to eject  out (silent aspiration) Pharyngeal - Solids Pharyngeal - Puree: Pharyngeal residue -  valleculae;Penetration/Aspiration during swallow;Reduced tongue  base retraction;Pharyngeal residue - pyriform sinuses;Reduced  laryngeal elevation Penetration/Aspiration details (puree): Material enters airway,   remains ABOVE vocal cords and not ejected out  Cervical Esophageal Phase    GO    Cervical Esophageal Phase Cervical Esophageal Phase: [name]         [place] Siren 10/11/2013, 12:14 PM Orbie Pyo Colvin Caroli.Ed CCC-SLP Pager [phone]      Anti-infectives: Anti-infectives   Start     Dose/Rate Route Frequency Ordered Stop   10/09/13 2200  cefTAZidime (FORTAZ) 1 g in dextrose 5 % 50 mL IVPB     1 g 100 mL/hr over 30 Minutes Intravenous 3 times per day 10/09/13 1020 10/13/13 2359   10/08/13 1000  cefTAZidime (FORTAZ) 1 g in dextrose 5 % 50 mL IVPB  Status:  Discontinued     1 g 100 mL/hr over 30 Minutes Intravenous Every 12 hours 10/08/13 0937 10/09/13 1020   10/08/13 0600  vancomycin (VANCOCIN) IVPB 750 mg/150 ml premix  Status:  Discontinued     750 mg 150 mL/hr over 60 Minutes Intravenous Every 12 hours 10/07/13 1834 10/09/13 0826   10/07/13 1845  vancomycin (VANCOCIN) IVPB 1000 mg/200 mL premix     1,000 mg 200 mL/hr over 60 Minutes Intravenous  Once 10/07/13 1834 10/07/13 2054   10/07/13 1845  piperacillin-tazobactam (ZOSYN) IVPB 3.375 g  Status:  Discontinued  3.375 g 12.5 mL/hr over 240 Minutes Intravenous Every 8 hours 10/07/13 1835 10/08/13 6168      Assessment/Plan:  1 - Likely Metastatic Kidney Cancer  - Agree with biopsy for tissue typing to help direct medical therapy. At this point I feel his mass is surgically unresectable given complete encasement of IVC and right renal vasculature. Fortunately he remains without local symptoms or gross hematuria. We briefly discussed medical therapy and that agents can usually offer activity but are rarely curative. Also stressed importance of follow-up brain imaging in several mos to help rule out underlying cerebellar mass given h/o bleed in this area. Medical oncology appointment pending.  Will follow, call with questions.     [facility], Vallerie Hentz 10/12/2013

## 2013-10-12 NOTE — Progress Notes (Signed)
[place] TEAM 1 - Stepdown/ICU TEAM Progress Note  CHARLES ANDRINGA YKD:[id number] DOB: Jan 07, 1936 DOA: 10/07/2013 PCP: Odette Fraction, MD  Admit HPI / Brief Narrative: 78 year old male with cardiac arrest 2nd to MI in July 2015 (tx at outside hospital), and cerebellar hemorrhage 09/25/2013, after which he was discharged for to inpatient rehab 8/25. While there, he developed fevers and lethargy. Medical staff there was concerned for sepsis, PCCM called to readmit on 10/07/2013.    Significant Events:  8/25 - discharged from NICU to inpatient rehab  8/31 - CT head - No new intracranial hemorrhage 9/1 - readmitted to NICU for presumed septic shock, resp failure, fever 103 - intubated  9/2 - improved on pressors  9/2 EEG > generalized irregular slow activity  9/2 [place] abdo - Multiple gallstones. 4.2 cm right renal mass suspicious for renal cell carcinoma. Two large solid retroperitoneal mass lesions, the largest measures 9.6 cm in diameter. Metastatic disease, primary  retroperitoneal tumor, and lymphoma could present in this fashion.  9/3 - improved neurostatus - extubated  9/3 CT - 7.4 cm enhancing mass is seen involving the right kidney consistent with renal cell carcinoma. It may involve the renal vein in the hilum, but does not appear to extend significantly centrally. 3.7 cm right adrenal mass is noted consistent with metastatic disease. Large multiloculated mass is noted in the right retroperitoneal region measuring 10 x 5.9 cm consistent with metastatic disease.   HPI/Subjective: Pt has no new complaints at this time.  He denies cp, ha, n//v, or abdom pain.  Feeding tube was pulled out ~8 inches by pt by mistake, and was re-advanced per MD, w/ TF placed on hold.  Pt is anxious to drink/eat again.    Assessment/Plan:  R Renal mass - renal cell CA confirmed with [place] and CT - likely mets to adrenal gland and retroperitoneum - Urology feels they have little to offer and that surgery is  not indicated - Oncology consult pending, but prelim note suggets bx WILL be needed and suggests there may be a role for resection - CT of the chest is negative for pulmonary metastases - await formal consult from [place] - ask IR to reschedule bx (Plavix on hold - troponin close to normal)  Septic shock - unclear etiology UA negative 8/31 but 21-50 WBC on 9/2 - urine culture negative - blood culture no growth - CXR not c/w pneumonia or aspiration - continue empiric abx for 7 days of tx   Stress cardiac ischemia - CAD s/p cardiac arrest July 2015 s/p DES x2 V fib cardiac arrest on 08/21/2013 in [place] CPR for several minutes - cardiac cath on 08/25/2013 and received drug-eluting stent to LAD and another drug-eluting stent to left circumflex artery - residual 50% stenosis in the RCA - patient was discharged on 09/05/2013 - plavix to be resumed once bx obtained - troponin increased during septic episode, but is improving now, w/ no s/sx to suggest active ischemia    Acute resp failure in setting of septic shock  Intubated 9/1 > extubated 9/3 - resp status stable    Acute encephalopathy  secondary to hypotension in setting carotid dz - mental status much improved, but remains mildly confuses when pushed   Dysphagia  Was on honey thick liquid after d/c from [place] [facility] July 2015 - MBS 8/3 resulted in rec for D3 w/ thin w/ chin tuck - unfortunately f/u MBS 9/5 suggests NPO status only - placed feeding tub >  tube feeds   Recent R cerebellar bleed without hydrocephalus Due to malignant HTN in setting of ASA/Plavix - stable at this time   Hypovolemia, hyponatremia  Resolved w/ IVF resuscitation  Hypokalemia  Replace and follow trend - due to limited intake - Mg normal   Anemia - chronic Hgb is stable  Coagulapathy Due to sepsis? - resolved  Hypothyroid On synthroid - TSH 4.173  Severe malnutrition in the context of acute illness Tube feeds initiated   Code Status:  FULL Family Communication: spoke w/ pt and wife at length  Disposition Plan: SDU   Consultants: Urology Oncology PCCM Neurology  Antibiotics: ceftaz 9/02 >>  DVT prophylaxis: SCDs  Objective: Blood pressure 134/63, pulse 81, temperature 98.6 F (37 C), temperature source Oral, resp. rate 26, height 6' (1.829 m), weight 74 kg (163 lb 2.3 oz), SpO2 97.00%.  Intake/Output Summary (Last 24 hours) at 10/12/13 1533 Last data filed at 10/12/13 1210  Gross per 24 hour  Intake   1230 ml  Output    450 ml  Net    780 ml   Exam: General: No acute respiratory distress - cachectic - alert and conversant  Lungs: Clear to auscultation bilaterally without wheezes or crackles Cardiovascular: Regular rate and rhythm without murmur gallop or rub  Abdomen: Nontender, nondistended, soft, bowel sounds positive, no rebound, no ascites, no appreciable mass Extremities: No significant cyanosis, clubbing, or edema bilateral lower extremities  Data Reviewed: Basic Metabolic Panel:  Recent Labs Lab 10/08/13 0630 10/09/13 0500 10/10/13 0059 10/11/13 0445 10/12/13 0500  NA 132* 134* 133* 136* 137  K 4.1 4.1 3.7 3.2* 3.4*  CL 98 102 101 97 98  CO2 19 19 20 24 25   GLUCOSE 84 127* 105* 93 111*  BUN 34* 26* 22 19 25*  CREATININE 1.65* 1.18 0.98 1.03 1.08  CALCIUM 8.8 8.1* 8.4 8.8 8.7  MG  --   --   --  1.9  --   PHOS  --   --   --  3.3  --    Liver Function Tests:  Recent Labs Lab 10/07/13 1834 10/07/13 1929 10/09/13 0500 10/10/13 0059 10/12/13 0500  AST 21 24 24 21 15   ALT 20 19 18 15 13   ALKPHOS 59 60  60 54 53 59  BILITOT 0.4 0.5 0.3 0.4 0.5  PROT 7.7 7.6 6.4 6.2 7.1  ALBUMIN 2.6* 2.6* 1.9* 1.9* 2.2*    Recent Labs Lab 10/07/13 1929  LIPASE 36  AMYLASE 60   Coags:  Recent Labs Lab 10/07/13 1929 10/09/13 0500 10/11/13 1647  INR 1.62* 1.49 1.32   CBC:  Recent Labs Lab 10/06/13 0954 10/07/13 1834 10/07/13 1929 10/08/13 0630 10/09/13 0500 10/10/13 0059  10/12/13 0500  WBC 9.4 20.5* 19.1* 19.8* 11.2* 9.5 7.8  NEUTROABS 8.0* 18.9*  --   --  10.1* 8.3*  --   HGB 11.8* 11.0* 10.9* 10.9* 9.9* 9.3* 9.4*  HCT 37.0* 34.6* 33.7* 33.8* 31.5* 29.6* 29.8*  MCV 83.5 85.6 83.0 82.4 86.1 85.8 83.9  PLT 353 394 362 304 228 226 290    Cardiac Enzymes:  Recent Labs Lab 10/09/13 0955 10/09/13 1244 10/09/13 1940 10/10/13 0059 10/12/13 0500  CKTOTAL  --  52 49 42  --   CKMB  --  6.4* 7.1* 5.5*  --   TROPONINI 1.41* 1.77* 1.91* 1.61* 0.32*   BNP (last 3 results)  Recent Labs  09/08/13 0525 09/10/13 0419  PROBNP 15176.0* 9146.0*   CBG:  Recent  Labs Lab 10/09/13 1536 10/09/13 1950 10/09/13 2338 10/10/13 0343 10/10/13 0804  GLUCAP 105* 95 98 96 101*    Recent Results (from the past 240 hour(s))  URINE CULTURE     Status: None   Collection Time    10/06/13  5:21 PM      Result Value Ref Range Status   Specimen Description URINE, CATHETERIZED   Final   Special Requests septra   Final   Culture  Setup Time     Final   Value: 10/06/2013 18:37     Performed at [facility]     Final   Value: NO GROWTH     Performed at [facility]   Culture     Final   Value: NO GROWTH     Performed at [facility]   Report Status 10/07/2013 FINAL   Final  CULTURE, BLOOD (ROUTINE X 2)     Status: None   Collection Time    10/06/13  6:41 PM      Result Value Ref Range Status   Specimen Description BLOOD LEFT ARM   Final   Special Requests     Final   Value: BOTTLES DRAWN AEROBIC AND ANAEROBIC 10CC PT ON SEPTRA   Culture  Setup Time     Final   Value: 10/07/2013 00:25     Performed at [facility]   Culture     Final   Value:        BLOOD CULTURE RECEIVED NO GROWTH TO DATE CULTURE WILL BE HELD FOR 5 DAYS BEFORE ISSUING A FINAL NEGATIVE REPORT     Performed at [facility]   Report Status PENDING   Incomplete  CULTURE, BLOOD (ROUTINE X 2)     Status: None   Collection Time    10/06/13   6:47 PM      Result Value Ref Range Status   Specimen Description BLOOD LEFT RIGHT ARM   Final   Special Requests BOTTLES DRAWN AEROBIC ONLY 3CC   Final   Culture  Setup Time     Final   Value: 10/07/2013 00:25     Performed at [facility]   Culture     Final   Value:        BLOOD CULTURE RECEIVED NO GROWTH TO DATE CULTURE WILL BE HELD FOR 5 DAYS BEFORE ISSUING A FINAL NEGATIVE REPORT     Performed at [facility]   Report Status PENDING   Incomplete  CULTURE, BLOOD (ROUTINE X 2)     Status: None   Collection Time    10/07/13  7:49 PM      Result Value Ref Range Status   Specimen Description BLOOD LEFT A-LINE   Final   Special Requests BOTTLES DRAWN AEROBIC AND ANAEROBIC 10CC   Final   Culture  Setup Time     Final   Value: 10/08/2013 01:08     Performed at [facility]   Culture     Final   Value:        BLOOD CULTURE RECEIVED NO GROWTH TO DATE CULTURE WILL BE HELD FOR 5 DAYS BEFORE ISSUING A FINAL NEGATIVE REPORT     Performed at [facility]   Report Status PENDING   Incomplete  CULTURE, BLOOD (ROUTINE X 2)     Status: None   Collection Time    10/07/13  9:18 PM  Result Value Ref Range Status   Specimen Description BLOOD RIGHT UPPER WRIST   Final   Special Requests BOTTLES DRAWN AEROBIC ONLY 3CC   Final   Culture  Setup Time     Final   Value: 10/08/2013 01:09     Performed at [facility]   Culture     Final   Value:        BLOOD CULTURE RECEIVED NO GROWTH TO DATE CULTURE WILL BE HELD FOR 5 DAYS BEFORE ISSUING A FINAL NEGATIVE REPORT     Performed at [facility]   Report Status PENDING   Incomplete  CULTURE, RESPIRATORY (NON-EXPECTORATED)     Status: None   Collection Time    10/07/13 10:10 PM      Result Value Ref Range Status   Specimen Description TRACHEAL ASPIRATE   Final   Special Requests NONE   Final   Gram Stain     Final   Value: FEW WBC PRESENT,BOTH PMN AND MONONUCLEAR     RARE SQUAMOUS EPITHELIAL CELLS  PRESENT     RARE YEAST     Performed at [facility]   Culture     Final   Value: MODERATE CANDIDA ALBICANS     Performed at [facility]   Report Status 10/10/2013 FINAL   Final     Studies:  Recent x-ray studies have been reviewed in detail by the Attending Physician  Scheduled Meds:  Scheduled Meds: . antiseptic oral rinse  7 mL Mouth Rinse QID  . aspirin  81 mg Per Tube Daily  . atorvastatin  80 mg Per Tube QPM  . cefTAZidime (FORTAZ)  IV  1 g Intravenous 3 times per day  . chlorhexidine  15 mL Mouth Rinse BID  . feeding supplement (PRO-STAT SUGAR FREE 64)  30 mL Per Tube BID  . heparin  5,000 Units Subcutaneous 3 times per day  . levothyroxine  175 mcg Per Tube QAC breakfast  . metoprolol tartrate  12.5 mg Per Tube BID  . nitroGLYCERIN  0.5 inch Topical 4 times per day  . pantoprazole (PROTONIX) IV  40 mg Intravenous Q24H    Time spent on care of this patient: 35 mins   MCCLUNG,JEFFREY T , MD   [facility] Office  [phone] Pager - Text Page per Shea Evans as per below:  On-Call/Text Page:      [email or web address]      password [email or web address]  If 7PM-7AM, please contact night-coverage [email or web address] Password [email or web address] 10/12/2013, 3:33 PM   LOS: 5 days

## 2013-10-13 DIAGNOSIS — I252 Old myocardial infarction: Secondary | ICD-10-CM

## 2013-10-13 DIAGNOSIS — R1900 Intra-abdominal and pelvic swelling, mass and lump, unspecified site: Secondary | ICD-10-CM

## 2013-10-13 DIAGNOSIS — Z931 Gastrostomy status: Secondary | ICD-10-CM

## 2013-10-13 DIAGNOSIS — Z951 Presence of aortocoronary bypass graft: Secondary | ICD-10-CM

## 2013-10-13 DIAGNOSIS — Z8679 Personal history of other diseases of the circulatory system: Secondary | ICD-10-CM

## 2013-10-13 DIAGNOSIS — N289 Disorder of kidney and ureter, unspecified: Secondary | ICD-10-CM

## 2013-10-13 LAB — GLUCOSE, CAPILLARY
GLUCOSE-CAPILLARY: 152 mg/dL — AB (ref 70–99)
GLUCOSE-CAPILLARY: 151 mg/dL — AB (ref 70–99)
Glucose-Capillary: 124 mg/dL — ABNORMAL HIGH (ref 70–99)
Glucose-Capillary: 133 mg/dL — ABNORMAL HIGH (ref 70–99)
Glucose-Capillary: 140 mg/dL — ABNORMAL HIGH (ref 70–99)
Glucose-Capillary: 124 mg/dL — ABNORMAL HIGH (ref 70–99)

## 2013-10-13 LAB — CBC
HEMATOCRIT: 31.1 % — AB (ref 39.0–52.0)
Hemoglobin: 9.5 g/dL — ABNORMAL LOW (ref 13.0–17.0)
MCH: 26.6 pg (ref 26.0–34.0)
MCHC: 30.5 g/dL (ref 30.0–36.0)
MCV: 87.1 fL (ref 78.0–100.0)
Platelets: 320 10*3/uL (ref 150–400)
RBC: 3.57 MIL/uL — AB (ref 4.22–5.81)
RDW: 17.8 % — AB (ref 11.5–15.5)
WBC: 7.9 10*3/uL (ref 4.0–10.5)

## 2013-10-13 LAB — CULTURE, BLOOD (ROUTINE X 2)
CULTURE: NO GROWTH
Culture: NO GROWTH

## 2013-10-13 LAB — COMPREHENSIVE METABOLIC PANEL
ALT: 13 U/L (ref 0–53)
AST: 15 U/L (ref 0–37)
Albumin: 2.1 g/dL — ABNORMAL LOW (ref 3.5–5.2)
Alkaline Phosphatase: 63 U/L (ref 39–117)
Anion gap: 12 (ref 5–15)
BILIRUBIN TOTAL: 0.3 mg/dL (ref 0.3–1.2)
BUN: 26 mg/dL — ABNORMAL HIGH (ref 6–23)
CHLORIDE: 100 meq/L (ref 96–112)
CO2: 26 mEq/L (ref 19–32)
Calcium: 8.7 mg/dL (ref 8.4–10.5)
Creatinine, Ser: 0.88 mg/dL (ref 0.50–1.35)
GFR calc Af Amer: >90 mL/min (ref >90)
GFR calc non Af Amer: 81 mL/min — ABNORMAL LOW (ref >90)
Glucose, Bld: 142 mg/dL — ABNORMAL HIGH (ref 70–99)
Potassium: 3.4 mEq/L — ABNORMAL LOW (ref 3.7–5.3)
SODIUM: 138 meq/L (ref 137–147)
Total Protein: 6.9 g/dL (ref 6.0–8.3)

## 2013-10-13 LAB — HEMOGLOBIN A1C
Hgb A1c MFr Bld: 6.1 % — ABNORMAL HIGH (ref <5.7)
MEAN PLASMA GLUCOSE: 128 mg/dL — AB (ref <117)

## 2013-10-13 LAB — TROPONIN I: Troponin I: <0.3 ng/mL (ref <0.30)

## 2013-10-13 MED ORDER — JEVITY 1.2 CAL PO LIQD
1000.0000 mL | ORAL | Status: AC
  Filled 2013-10-13 (×3): qty 1000

## 2013-10-13 MED ORDER — PRO-STAT SUGAR FREE PO LIQD
30.0000 mL | Freq: Three times a day (TID) | ORAL | Status: DC
  Administered 2013-10-13 – 2013-10-19 (×18): 30 mL
  Filled 2013-10-13 (×23): qty 30

## 2013-10-13 MED ORDER — SODIUM CHLORIDE 0.9 % IJ SOLN
10.0000 mL | INTRAMUSCULAR | Status: DC | PRN
  Administered 2013-10-16: 20 mL

## 2013-10-13 MED ORDER — SODIUM CHLORIDE 0.9 % IJ SOLN
10.0000 mL | Freq: Two times a day (BID) | INTRAMUSCULAR | Status: DC
  Administered 2013-10-13 – 2013-10-14 (×2): 20 mL
  Administered 2013-10-14 – 2013-10-18 (×4): 10 mL

## 2013-10-13 NOTE — Progress Notes (Signed)
OT Cancellation Note  Patient Details Name: Taylor Keller MRN: [id number] DOB: 11/09/1935   Cancelled Treatment:    Reason Eval/Treat Not Completed: Fatigue/lethargy limiting ability to participate. Pt's family requested pt not be disturbed, pt had just finished PT and returned to bed. Will attempt again later as schedule allows.  Malka So 10/13/2013, 11:04 AM [phone]

## 2013-10-13 NOTE — Progress Notes (Signed)
Physical Therapy Treatment Patient Details Name: ROLLAN ROGER MRN: [id number] DOB: May 16, 1935 Today's Date: 29-Oct-2013    History of Present Illness 78 yo wm brought in via [facility] for severe HA, N/V, & dizziness. Imaging reveals ICH, MI, renal mass. Pt with D/C to CIR 8/25 readmit 9/1 with inubation-9/3.    PT Comments    Pt with excellent progression with mobility today. Pt maintained right lean in sitting and standing when cues not provided for midline posture. Pt encouraged to continue mobility and bil LE HEP. Pt very motivated to walk, return to driving and mow the grass. Continue to recommend CIR. Will follow.   Follow Up Recommendations  CIR     Equipment Recommendations       Recommendations for Other Services       Precautions / Restrictions Precautions Precautions: Fall Precaution Comments: right lean, panda    Mobility  Bed Mobility               General bed mobility comments: Pt in recliner on arrival  Transfers Overall transfer level: Needs assistance     Sit to Stand: Min assist         General transfer comment: cues for hand placement, anterior weight shift and assist to stand x 3 from recliner with armrests  Ambulation/Gait Ambulation/Gait assistance: Min assist;+2 safety/equipment Ambulation Distance (Feet): 38 Feet (38', 45' with seated rest between) Assistive device: Rolling walker (2 wheeled) Gait Pattern/deviations: Step-through pattern;Decreased stride length;Narrow base of support   Gait velocity interpretation: Below normal speed for age/gender General Gait Details: pt with right lean with cues for midline posture, safety and increasing BOs with chair followed behind for safety and fatigue   Stairs            Wheelchair Mobility    Modified Rankin (Stroke Patients Only)       Balance     Sitting balance-Leahy Scale: Fair       Standing balance-Leahy Scale: Poor                      Cognition  Arousal/Alertness: Awake/alert Behavior During Therapy: WFL for tasks assessed/performed Overall Cognitive Status: Impaired/Different from baseline               Problem Solving: Slow processing      Exercises      General Comments        Pertinent Vitals/Pain Pain Assessment: No/denies pain HR 105     Home Living                      Prior Function            PT Goals (current goals can now be found in the care plan section) Progress towards PT goals: Progressing toward goals    Frequency  Min 3X/week    PT Plan Current plan remains appropriate    Co-evaluation             End of Session Equipment Utilized During Treatment: Gait belt Activity Tolerance: Patient tolerated treatment well Patient left: in chair;with call bell/phone within reach;with family/visitor present     Time: 2725-3664 PT Time Calculation (min): 28 min  Charges:  $Gait Training: 23-37 mins                    G Codes:      Melford Aase 2013-10-29, 9:57 AM Elwyn Reach, [place]

## 2013-10-13 NOTE — Progress Notes (Signed)
[place] TEAM 1 - Stepdown/ICU TEAM Progress Note  Taylor Keller POE:[id number] DOB: 09-Jan-1936 DOA: 10/07/2013 PCP: Odette Fraction, MD  Admit HPI / Brief Narrative: 78 year old male with cardiac arrest 2nd to MI in July 2015 (tx at outside hospital), and cerebellar hemorrhage 09/25/2013, after which he was discharged for to inpatient rehab 8/25. While there, he developed fevers and lethargy. Medical staff there was concerned for sepsis, PCCM called to readmit on 10/07/2013.    Significant Events:  8/25 - discharged from NICU to inpatient rehab  8/31 - CT head - No new intracranial hemorrhage 9/1 - readmitted to NICU for presumed septic shock, resp failure, fever 103 - intubated  9/2 - improved on pressors  9/2 EEG > generalized irregular slow activity  9/2 [place] abdo - Multiple gallstones. 4.2 cm right renal mass suspicious for renal cell carcinoma. Two large solid retroperitoneal mass lesions, the largest measures 9.6 cm in diameter. Metastatic disease, primary  retroperitoneal tumor, and lymphoma could present in this fashion.  9/3 - improved neurostatus - extubated  9/3 CT - 7.4 cm enhancing mass is seen involving the right kidney consistent with renal cell carcinoma. It may involve the renal vein in the hilum, but does not appear to extend significantly centrally. 3.7 cm right adrenal mass is noted consistent with metastatic disease. Large multiloculated mass is noted in the right retroperitoneal region measuring 10 x 5.9 cm consistent with metastatic disease.   HPI/Subjective: Pt is resting comfortably at the time of my visit.    Assessment/Plan:  R Renal mass - renal cell CA? V/s Lymphoma  confirmed with [place] and CT - likely mets to adrenal gland and retroperitoneum - Urology feels they have little to offer and that surgery is not indicated - Oncology consult notes bx WILL be needed - CT of the chest is negative for pulmonary metastases - asked IR to reschedule bx (Plavix on hold -  troponin close to normal) but they are reluctant as pt received plavix on 9/4 and 9/5 (but not for many days prior to the 4th) - IR to consider bx for 9/8  Septic shock - unclear etiology UA negative 8/31 but 21-50 WBC on 9/2 - urine culture negative - blood culture no growth - CXR not c/w pneumonia or aspiration - continue empiric abx for 7 days of tx   Stress cardiac ischemia - CAD s/p cardiac arrest July 2015 s/p DES x2 V fib cardiac arrest on 08/21/2013 in [place] requiring CPR for several minutes - cardiac cath on 08/25/2013 and received drug-eluting stent to LAD and another drug-eluting stent to left circumflex artery - residual 50% stenosis in the RCA - patient was discharged on 09/05/2013 - plavix to be resumed once bx obtained, but do not wish to hold any longer than absolutely necessary w/ recent DES - troponin increased during septic episode, but has now normalized, w/ no s/sx to suggest active ischemia    Acute resp failure in setting of septic shock  Intubated 9/1 > extubated 9/3 - resp status stable    Acute encephalopathy  secondary to hypotension in setting carotid dz - mental status much improved, but remains mildly confused when pushed   Dysphagia  Was on honey thick liquid after d/c from [place] hospital July 2015 - MBS 8/3 resulted in rec for D3 w/ thin w/ chin tuck - unfortunately f/u MBS 9/5 suggests NPO status only - placed feeding tub > tube feeds   Recent R cerebellar bleed without hydrocephalus  Due to malignant HTN in setting of ASA/Plavix - stable at this time   Hypovolemia, hyponatremia  Resolved w/ IVF resuscitation  Hypokalemia  Replace and follow trend - due to limited intake - Mg normal   Anemia - chronic Hgb is stable  Coagulapathy Due to sepsis? - resolved  Hypothyroid On synthroid - TSH 4.173  Severe malnutrition in the context of acute illness Tube feeds initiated   Code Status: FULL Family Communication: no family present today  Disposition  Plan: SDU until stable after bx   Consultants: Urology Oncology PCCM Neurology IR  Antibiotics: ceftaz 9/02 >>  DVT prophylaxis: SCDs  Objective: Blood pressure 110/48, pulse 76, temperature 98.8 F (37.1 C), temperature source Oral, resp. rate 13, height 6' (1.829 m), weight 71.8 kg (158 lb 4.6 oz), SpO2 97.00%.  Intake/Output Summary (Last 24 hours) at 10/13/13 1603 Last data filed at 10/13/13 1551  Gross per 24 hour  Intake 1849.34 ml  Output   1050 ml  Net 799.34 ml   Exam: General: No acute respiratory distress - resting comfortably   Lungs: Clear to auscultation bilaterally without wheezes or crackles Cardiovascular: Regular rate and rhythm without murmur gallop or rub  Abdomen: Nontender, nondistended, soft, bowel sounds positive, no rebound, no ascites, no appreciable mass Extremities: No significant cyanosis, clubbing, or edema bilateral lower extremities  Data Reviewed: Basic Metabolic Panel:  Recent Labs Lab 10/09/13 0500 10/10/13 0059 10/11/13 0445 10/12/13 0500 10/13/13 0545  NA 134* 133* 136* 137 138  K 4.1 3.7 3.2* 3.4* 3.4*  CL 102 101 97 98 100  CO2 19 20 24 25 26   GLUCOSE 127* 105* 93 111* 142*  BUN 26* 22 19 25* 26*  CREATININE 1.18 0.98 1.03 1.08 0.88  CALCIUM 8.1* 8.4 8.8 8.7 8.7  MG  --   --  1.9  --   --   PHOS  --   --  3.3  --   --    Liver Function Tests:  Recent Labs Lab 10/07/13 1929 10/09/13 0500 10/10/13 0059 10/12/13 0500 10/13/13 0545  AST 24 24 21 15 15   ALT 19 18 15 13 13   ALKPHOS 60  60 54 53 59 63  BILITOT 0.5 0.3 0.4 0.5 0.3  PROT 7.6 6.4 6.2 7.1 6.9  ALBUMIN 2.6* 1.9* 1.9* 2.2* 2.1*    Recent Labs Lab 10/07/13 1929  LIPASE 36  AMYLASE 60   Coags:  Recent Labs Lab 10/07/13 1929 10/09/13 0500 10/11/13 1647  INR 1.62* 1.49 1.32   CBC:  Recent Labs Lab 10/07/13 1834  10/08/13 0630 10/09/13 0500 10/10/13 0059 10/12/13 0500 10/13/13 0545  WBC 20.5*  < > 19.8* 11.2* 9.5 7.8 7.9  NEUTROABS  18.9*  --   --  10.1* 8.3*  --   --   HGB 11.0*  < > 10.9* 9.9* 9.3* 9.4* 9.5*  HCT 34.6*  < > 33.8* 31.5* 29.6* 29.8* 31.1*  MCV 85.6  < > 82.4 86.1 85.8 83.9 87.1  PLT 394  < > 304 228 226 290 320  < > = values in this interval not displayed.  Cardiac Enzymes:  Recent Labs Lab 10/09/13 1244 10/09/13 1940 10/10/13 0059 10/12/13 0500 10/13/13 0545  CKTOTAL 52 49 42  --   --   CKMB 6.4* 7.1* 5.5*  --   --   TROPONINI 1.77* 1.91* 1.61* 0.32* <0.30   CBG:  Recent Labs Lab 10/12/13 2334 10/13/13 0459 10/13/13 0754 10/13/13 1209 10/13/13 1505  GLUCAP 124*  152* 133* 140* 151*    Recent Results (from the past 240 hour(s))  URINE CULTURE     Status: None   Collection Time    10/06/13  5:21 PM      Result Value Ref Range Status   Specimen Description URINE, CATHETERIZED   Final   Special Requests septra   Final   Culture  Setup Time     Final   Value: 10/06/2013 18:37     Performed at [place]     Final   Value: NO GROWTH     Performed at [facility]   Culture     Final   Value: NO GROWTH     Performed at [facility]   Report Status 10/07/2013 FINAL   Final  CULTURE, BLOOD (ROUTINE X 2)     Status: None   Collection Time    10/06/13  6:41 PM      Result Value Ref Range Status   Specimen Description BLOOD LEFT ARM   Final   Special Requests     Final   Value: BOTTLES DRAWN AEROBIC AND ANAEROBIC 10CC PT ON SEPTRA   Culture  Setup Time     Final   Value: 10/07/2013 00:25     Performed at [facility]   Culture     Final   Value: NO GROWTH 5 DAYS     Performed at [facility]   Report Status 10/13/2013 FINAL   Final  CULTURE, BLOOD (ROUTINE X 2)     Status: None   Collection Time    10/06/13  6:47 PM      Result Value Ref Range Status   Specimen Description BLOOD LEFT RIGHT ARM   Final   Special Requests BOTTLES DRAWN AEROBIC ONLY 3CC   Final   Culture  Setup Time     Final   Value: 10/07/2013 00:25       Performed at [facility]   Culture     Final   Value: NO GROWTH 5 DAYS     Performed at [facility]   Report Status 10/13/2013 FINAL   Final  CULTURE, BLOOD (ROUTINE X 2)     Status: None   Collection Time    10/07/13  7:49 PM      Result Value Ref Range Status   Specimen Description BLOOD LEFT A-LINE   Final   Special Requests BOTTLES DRAWN AEROBIC AND ANAEROBIC 10CC   Final   Culture  Setup Time     Final   Value: 10/08/2013 01:08     Performed at [facility]   Culture     Final   Value:        BLOOD CULTURE RECEIVED NO GROWTH TO DATE CULTURE WILL BE HELD FOR 5 DAYS BEFORE ISSUING A FINAL NEGATIVE REPORT     Performed at [facility]   Report Status PENDING   Incomplete  CULTURE, BLOOD (ROUTINE X 2)     Status: None   Collection Time    10/07/13  9:18 PM      Result Value Ref Range Status   Specimen Description BLOOD RIGHT UPPER WRIST   Final   Special Requests BOTTLES DRAWN AEROBIC ONLY 3CC   Final   Culture  Setup Time     Final   Value: 10/08/2013 01:09     Performed at [facility]  Final   Value:        BLOOD CULTURE RECEIVED NO GROWTH TO DATE CULTURE WILL BE HELD FOR 5 DAYS BEFORE ISSUING A FINAL NEGATIVE REPORT     Performed at [facility]   Report Status PENDING   Incomplete  CULTURE, RESPIRATORY (NON-EXPECTORATED)     Status: None   Collection Time    10/07/13 10:10 PM      Result Value Ref Range Status   Specimen Description TRACHEAL ASPIRATE   Final   Special Requests NONE   Final   Gram Stain     Final   Value: FEW WBC PRESENT,BOTH PMN AND MONONUCLEAR     RARE SQUAMOUS EPITHELIAL CELLS PRESENT     RARE YEAST     Performed at [facility]   Culture     Final   Value: MODERATE CANDIDA ALBICANS     Performed at [facility]   Report Status 10/10/2013 FINAL   Final     Studies:  Recent x-ray studies have been reviewed in detail by the Attending Physician  Scheduled  Meds:  Scheduled Meds: . antiseptic oral rinse  7 mL Mouth Rinse QID  . aspirin  81 mg Per Tube Daily  . atorvastatin  80 mg Per Tube QPM  . cefTAZidime (FORTAZ)  IV  1 g Intravenous 3 times per day  . chlorhexidine  15 mL Mouth Rinse BID  . feeding supplement (PRO-STAT SUGAR FREE 64)  30 mL Per Tube TID  . heparin  5,000 Units Subcutaneous 3 times per day  . levothyroxine  175 mcg Per Tube QAC breakfast  . metoprolol tartrate  12.5 mg Per Tube BID  . nitroGLYCERIN  0.5 inch Topical 4 times per day  . pantoprazole (PROTONIX) IV  40 mg Intravenous Q24H    Time spent on care of this patient: 25 mins   Essynce Munsch T , MD   [facility] Office  [phone] Pager - Text Page per Shea Evans as per below:  On-Call/Text Page:      [email or web address]      password [email or web address]  If 7PM-7AM, please contact night-coverage [email or web address] Password [email or web address] 10/13/2013, 4:03 PM   LOS: 6 days

## 2013-10-13 NOTE — Progress Notes (Signed)
Verified with Dr. Thereasa Solo to keep IJ and foley in place. Will continue to monitor.

## 2013-10-13 NOTE — Progress Notes (Signed)
NUTRITION FOLLOW-UP  DOCUMENTATION CODES Per approved criteria  -Severe malnutrition in the context of acute illness   Pt meets criteria for severe MALNUTRITION in the context of acute illness as evidenced by moderate subcutaneous fat and severe muscle depletion.  INTERVENTION: Increase Jevity 1.2 to 65 ml/hr  30 ml Prostat TID  TF regimen providing: 1560 ml, 2172 kcal, 131 grams protein, and 1263 ml H2O.  NUTRITION DIAGNOSIS: Inadequate oral intake related to inability to eat as evidenced by NPO status; ongoing.   Goal: Pt to meet >/= 90% of their estimated nutrition needs; not met.   Monitor:  TF regimen & tolerance, respiratory status, weight, labs, I/O's   ASSESSMENT: 78 year old male with recent cardiac arrest 2nd to MI, and cerebellar hemorrhage. In inpatient rehab, he developed fevers and lethargy. 9/1 readmitted to NICU for presumed septic shock, resp failure, fever 103.  Pt extubated 9/3, he failed swallow eval on this date. Pt does have a hx of dysphagia, repeat MBS failed 9/5.  Pt with suspected new cancer by CT, biopsy pending.  Adult TF protocol started 9/5.  Jevity 1.2 @ 50 ml/hr providing with 30 ml Prostat BID: 1200 ml, 1640 kcal, 97 grams protein, and 984 ml H2O. Per RN pt is tolerating TF well. No residuals and abdominal exam WDL.  Nasogastric feeding tube with tip in the proximal stomach.   Height: Ht Readings from Last 1 Encounters:  10/07/13 6' (1.829 m)    Weight: Wt Readings from Last 1 Encounters:  10/13/13 158 lb 4.6 oz (71.8 kg)  9/1 171 lb    BMI:  Body mass index is 21.46 kg/(m^2).  Estimated Nutritional Needs: Kcal: 2000-2300 Protein: 115-125g Fluid: 2 L/ day  Skin: scratches, ecchymosis  Diet Order: NPO    Intake/Output Summary (Last 24 hours) at 10/13/13 0943 Last data filed at 10/13/13 0800  Gross per 24 hour  Intake 2030.67 ml  Output    925 ml  Net 1105.67 ml    Last BM: 9/7   Labs:   Recent Labs Lab  10/10/13 0059 10/11/13 0445 10/12/13 0500 10/13/13 0545  NA 133* 136* 137 138  K 3.7 3.2* 3.4* 3.4*  CL 101 97 98 100  CO2 20 24 25 26   BUN 22 19 25* 26*  CREATININE 0.98 1.03 1.08 0.88  CALCIUM 8.4 8.8 8.7 8.7  MG  --  1.9  --   --   PHOS  --  3.3  --   --   GLUCOSE 105* 93 111* 142*    CBG (last 3)   Recent Labs  10/12/13 2334 10/13/13 0459 10/13/13 0754  GLUCAP 124* 152* 133*    Scheduled Meds: . antiseptic oral rinse  7 mL Mouth Rinse QID  . aspirin  81 mg Per Tube Daily  . atorvastatin  80 mg Per Tube QPM  . cefTAZidime (FORTAZ)  IV  1 g Intravenous 3 times per day  . chlorhexidine  15 mL Mouth Rinse BID  . feeding supplement (PRO-STAT SUGAR FREE 64)  30 mL Per Tube BID  . heparin  5,000 Units Subcutaneous 3 times per day  . levothyroxine  175 mcg Per Tube QAC breakfast  . metoprolol tartrate  12.5 mg Per Tube BID  . nitroGLYCERIN  0.5 inch Topical 4 times per day  . pantoprazole (PROTONIX) IV  40 mg Intravenous Q24H    Continuous Infusions: . sodium chloride 10 mL/hr at 10/11/13 1400  . feeding supplement (JEVITY 1.2 CAL) 1,000 mL (  10/13/13 0800)   [place], [place], [place] Pager [phone] After Hours Pager

## 2013-10-13 NOTE — Progress Notes (Signed)
UR Completed Bliss Behnke Graves-Bigelow, RN,BSN [phone]  

## 2013-10-13 NOTE — Consult Note (Signed)
#   [id number] is official consult note.  Lum Keas

## 2013-10-13 NOTE — Consult Note (Cosign Needed)
NAME:  Taylor Keller, Taylor Keller NO.:  [id number]  MEDICAL RECORD NO.:  [id number]  LOCATION:  3S01C                        FACILITY:  [facility]  PHYSICIAN:  Volanda Napoleon, M.D.  DATE OF BIRTH:  02-Jun-1935  DATE OF CONSULTATION: DATE OF DISCHARGE:                                CONSULTATION   REFERRING PHYSICIAN:  Raylene Miyamoto, MD  REASON FOR CONSULTATION:  Right kidney mass.  HISTORY OF PRESENT ILLNESS:  Taylor Keller is a nice 78 year old gentleman.  He has had a very complicated hospital course.  He was initially admitted with a myocardial infarction.  I think he had a stent placed in the LAD and left circumflex.  He then had a cerebellar hemorrhage.  This was in August.  He was subsequently intubated.  He has been over to the rehab unit.  There was some possible sepsis.  He has a feeding tube in place right now.  He did undergo a CT of his abdomen and pelvis.  This was done on October 09, 2013.  This showed a 7.4 x 6.2 cm mass rise from right kidney.  Also, noted was a large right retroperitoneal mass.  This measured 10 x 5.9 cm and there was nothing in the chest.  There is no lymphadenopathy in the mediastinum.  He was supposed to undergo a biopsy, but this has not been done.  He had a repeat MRI of the brain.  This showed a 3.7 x 3.7 x 3.5 cm right cerebellar hematoma.  This has some surrounding vasogenic edema. There is some mass effect upon the fourth ventricle.  They do not suspect that this was any obvious intracranial mass.  Again, he was supposed to undergo a biopsy of this right kidney mass, but this has been put off because of his underlying cardiac disease.  Urology is following him.  Urology has fell that he is surgically unresectable because of complete encasement of the IVC and right renal vasculature.  Again, he has a feeding tube in place.  His overall performance status is ECOG 3-4.  PAST MEDICAL HISTORY: 1. Remarkable for  Hypothyroidism. 2. Hypertension. 3. Hyperlipidemia. 4. Coronary artery disease. 5. History of CVA.  ALLERGIES:  ACE INHIBITORS.  CURRENT MEDICATIONS: 1. Aspirin 81 mg p.o. daily. 2. Lipitor 80 mg p.o. daily. 3. Fortaz 1 g IV q.8 hours. 4. Synthroid 0.175 mg p.o. daily. 5. Metoprolol 12.5 mg p.o. b.i.d. 6. Protonix 40 mg p.o. daily.  SOCIAL HISTORY:  Negative for past tobacco use.  I do not think there is any type of alcohol use.  FAMILY HISTORY:  Remarkable for a sister with colon cancer.  PHYSICAL EXAMINATION:  GENERAL:  This is an elderly thin white gentleman, in no obvious distress. VITAL SIGNS:  Showed a temperature of 98, pulse 90, respiratory rate 23, blood pressure 139/58.  HEAD AND NECK:  Normocephalic, atraumatic skull. There are no ocular or oral lesions.  He has a feeding tube in the right naris.  He has no obvious adenopathy in the neck. LUNGS:  Clear. CARDIAC:  Regular rate and rhythm with a 1/6 systolic ejection murmur. ABDOMEN:  Soft.  He does have a fullness in the right upper quadrant.  Liver edge is nonpalpable.  There is no obvious splenomegaly.  He has no fluid wave. BACK:  Without masses, edema, or erythema. EXTREMITIES:  No clubbing, cyanosis, or edema.  LABORATORY STUDIES:  White cell count 7.9, hemoglobin 9.5, hematocrit 31.1, platelet count 320.  BUN 26, creatinine 0.88.  Calcium 8.7 with an albumin of 2.1.  Liver function tests were okay.  LDH is 265.  IMPRESSION:  Taylor Keller is a 78 year old gentleman.  He has a history of coronary artery disease.  He has had a myocardial infarction.  He has had stents placed.  No cerebellar hemorrhage.  He has a right renal mass.  He has retroperitoneal mass.  He is not an operative candidate according to Urology.  Again, he really needs to have a biopsy.  I understand the issues with respect to his coronary artery disease and the elevated troponin levels. However, there is no way he can even be  considered for therapy unless we know exactly what is being treated.  One would like to think that this is a renal cell carcinoma.  Even if it was, we would have to see if this is a clear cell subtype or a sarcomatoid subtype.  It is possible that he may have a lymphoma in the kidney.  Again, a biopsy is indicated.  It does not look like this is any kind of metastatic disease to the kidney.  He has nothing in his lungs or chest.  I do not see anything that would suggest a past history of melanoma.  I do not believe this is bladder cancer.  I suppose it might be renal hilar carcinoma which is a transitional cell type of cancer.  We will await for the biopsy to be done.  Currently, his performance status is just not that great.  How aggressive we can be with respect to therapy is going to be a question.  For now, we will just await for biopsies to get done.  He is a very nice guy.  I did enjoy talking to him.  He did understand what I was saying.  With the feeding tube in place, it is hard for him to talk.     Volanda Napoleon, M.D.     PRE/MEDQ  D:  10/13/2013  T:  10/13/2013  Job:  [id number]

## 2013-10-13 NOTE — Care Management Note (Unsigned)
    Page 1 of 1   10/15/2013     2:41:34 PM CARE MANAGEMENT NOTE 10/15/2013  Patient:  Taylor Keller, Taylor Keller   Account Number:  [id number]  Date Initiated:  10/13/2013  Documentation initiated by:  Ricki Miller  Subjective/Objective Assessment:   Pt admitted for sepsis. Previous admit pt was d/c from CIR.     Action/Plan:   PT recommends CIR consult. RN to ask MD to place order. CM will continue to monitor for disposition needs. CSW to look at [facility] for back up plan.   Anticipated DC Date:  10/15/2013   Anticipated DC Plan:  IP REHAB FACILITY      DC Planning Services  CM consult      Choice offered to / List presented to:             Status of service:  In process, will continue to follow Medicare Important Message given?  YES (If response is "NO", the following Medicare IM given date fields will be blank) Date Medicare IM given:  10/13/2013 Medicare IM given by:  Ricki Miller Date Additional Medicare IM given:   Additional Medicare IM given by:    Discharge Disposition:    Per UR Regulation:  Reviewed for med. necessity/level of care/duration of stay  If discussed at [place] of Stay Meetings, dates discussed:   10/14/2013  10/16/2013    Comments:  10-15-13 1438 Chriss Driver- Cherlyn Cushing, RN, BSN [phone] CM did speak to pt and wife in concerns of disposition needs. Per wife she would like to take the pt home with additional care. CM will continue to monitor.

## 2013-10-13 NOTE — Progress Notes (Signed)
Patient ID: SULO JANCZAK, male   DOB: 04-03-1935, 78 y.o.   MRN: [id number] Request received again for CT guided biopsy of right renal/RP mass in pt. Pt was previously seen by our service on 9/4 but procedure cancelled due to elevated troponins, cerebellar hematoma. Latest troponin nl. Pt awake/alert , answers questions appropriately; chest- CTA bilat ant; heart- RRR; abd- soft,+BS, NT, sl fullness RUQ; ext- FROM, no edema. NG tube in place. Pt's last dose of plavix was 9/5. Would prefer to hold plavix 5 days if possible prior to bx to minimize bleeding risks; however hx of recent DES placement noted. Will discuss case with Dr. Laurence Ferrari in am before proceeding but will prepare pt as if bx will be done 9/8. Details/risks of bx d/w pt/pt's wife with their understanding and consent.

## 2013-10-13 NOTE — Progress Notes (Signed)
Verified with Dr. Thereasa Solo to leave IJ and foley in place for now. Will continue to monitor pt.

## 2013-10-14 ENCOUNTER — Inpatient Hospital Stay (HOSPITAL_COMMUNITY): Payer: Medicare HMO

## 2013-10-14 DIAGNOSIS — R4789 Other speech disturbances: Secondary | ICD-10-CM

## 2013-10-14 DIAGNOSIS — E039 Hypothyroidism, unspecified: Secondary | ICD-10-CM

## 2013-10-14 DIAGNOSIS — I259 Chronic ischemic heart disease, unspecified: Secondary | ICD-10-CM

## 2013-10-14 DIAGNOSIS — G9340 Encephalopathy, unspecified: Secondary | ICD-10-CM

## 2013-10-14 LAB — GLUCOSE, CAPILLARY
GLUCOSE-CAPILLARY: 118 mg/dL — AB (ref 70–99)
GLUCOSE-CAPILLARY: 115 mg/dL — AB (ref 70–99)
GLUCOSE-CAPILLARY: 105 mg/dL — AB (ref 70–99)
Glucose-Capillary: 102 mg/dL — ABNORMAL HIGH (ref 70–99)
Glucose-Capillary: 104 mg/dL — ABNORMAL HIGH (ref 70–99)
Glucose-Capillary: 111 mg/dL — ABNORMAL HIGH (ref 70–99)
Glucose-Capillary: 113 mg/dL — ABNORMAL HIGH (ref 70–99)

## 2013-10-14 LAB — BASIC METABOLIC PANEL
Anion gap: 12 (ref 5–15)
BUN: 26 mg/dL — ABNORMAL HIGH (ref 6–23)
CALCIUM: 8.4 mg/dL (ref 8.4–10.5)
CHLORIDE: 98 meq/L (ref 96–112)
CO2: 27 meq/L (ref 19–32)
CREATININE: 0.81 mg/dL (ref 0.50–1.35)
GFR calc non Af Amer: 84 mL/min — ABNORMAL LOW (ref >90)
Glucose, Bld: 113 mg/dL — ABNORMAL HIGH (ref 70–99)
Potassium: 3.4 mEq/L — ABNORMAL LOW (ref 3.7–5.3)
Sodium: 137 mEq/L (ref 137–147)

## 2013-10-14 LAB — CULTURE, BLOOD (ROUTINE X 2)
CULTURE: NO GROWTH
Culture: NO GROWTH

## 2013-10-14 LAB — CBC
HEMATOCRIT: 29.9 % — AB (ref 39.0–52.0)
Hemoglobin: 9.1 g/dL — ABNORMAL LOW (ref 13.0–17.0)
MCH: 26.1 pg (ref 26.0–34.0)
MCHC: 30.4 g/dL (ref 30.0–36.0)
MCV: 85.9 fL (ref 78.0–100.0)
PLATELETS: 325 10*3/uL (ref 150–400)
RBC: 3.48 MIL/uL — ABNORMAL LOW (ref 4.22–5.81)
RDW: 18 % — ABNORMAL HIGH (ref 11.5–15.5)
WBC: 7.3 10*3/uL (ref 4.0–10.5)

## 2013-10-14 LAB — PROTIME-INR
INR: 1.36 (ref 0.00–1.49)
Prothrombin Time: 16.8 seconds — ABNORMAL HIGH (ref 11.6–15.2)

## 2013-10-14 LAB — APTT: APTT: 34 s (ref 24–37)

## 2013-10-14 MED ORDER — FENTANYL CITRATE 0.05 MG/ML IJ SOLN
INTRAMUSCULAR | Status: AC | PRN
  Administered 2013-10-14: 25 ug via INTRAVENOUS

## 2013-10-14 MED ORDER — LIDOCAINE HCL 1 % IJ SOLN
INTRAMUSCULAR | Status: AC
  Filled 2013-10-14: qty 10

## 2013-10-14 MED ORDER — MIDAZOLAM HCL 2 MG/2ML IJ SOLN
INTRAMUSCULAR | Status: AC | PRN
  Administered 2013-10-14: 0.5 mg via INTRAVENOUS

## 2013-10-14 MED ORDER — FENTANYL CITRATE 0.05 MG/ML IJ SOLN
INTRAMUSCULAR | Status: AC
  Filled 2013-10-14: qty 2

## 2013-10-14 MED ORDER — MIDAZOLAM HCL 2 MG/2ML IJ SOLN
INTRAMUSCULAR | Status: AC
  Filled 2013-10-14: qty 2

## 2013-10-14 MED ORDER — JEVITY 1.2 CAL PO LIQD
1000.0000 mL | ORAL | Status: DC
  Administered 2013-10-14 – 2013-10-20 (×7): 1000 mL
  Filled 2013-10-14 (×13): qty 1000

## 2013-10-14 NOTE — Progress Notes (Signed)
Occupational Therapy Evaluation Patient Details Name: Taylor Keller RECORD MRN: [id number] DOB: 06/19/1935 Today's Date: 10/14/2013    History of Present Illness 78 yo wm brought in via [facility] for severe HA, N/V, & dizziness. Per pt the HA began around 2130 & cont to get worse. Pt with slight RUE pronation & drift, Rt facial droop, nystagmus, RLE sensory deficit. Imaging reveals cerebellar hemorrhage. D/C to CIR 8/25. Readmitted to NICU 9/1 for presumed sepsis. 9/2 CT demonstrated renal and retroperitoneal masses. Scheduled for biopsy 9/8.    Clinical Impression   Pt making steady progress on CIR before becoming ill. Pt with continued functional decline as compared to initial eval. At this time, feel pt will benefit from CIR to return home with wife. Discussed D/C options with pt/wife. Pt scheduled for biopsy today. Recommend palliative care consult to help with management and support.     Follow Up Recommendations  CIR;Supervision/Assistance - 24 hour    Equipment Recommendations  None recommended by OT    Recommendations for Other Services Rehab consult     Precautions / Restrictions Precautions Precautions: Fall Precaution Comments: c/o dizziness with movement Restrictions Weight Bearing Restrictions: No      Mobility Bed Mobility Overal bed mobility: Needs Assistance Bed Mobility: Rolling;Supine to Sit;Sit to Supine Rolling: Min assist   Supine to sit: Mod assist Sit to supine: Mod assist   General bed mobility comments: vc to use focal point during movement  Transfers Overall transfer level: Needs assistance Equipment used: 2 person hand held assist (face to face with gait belt ) Transfers: Sit to/from Stand Sit to Stand: +2 physical assistance;Min assist Stand pivot transfers: Mod assist;+2 physical assistance       General transfer comment: increased assitance required with attempted movement    Balance Overall balance assessment: Needs assistance   Sitting  balance-Leahy Scale: Fair       Standing balance-Leahy Scale: Poor Standing balance comment: posterior lean . RT lean                            ADL Overall ADL's : Needs assistance/impaired Eating/Feeding: NPO   Grooming: Minimal assistance   Upper Body Bathing: Moderate assistance;Sitting   Lower Body Bathing: Maximal assistance;Sit to/from stand   Upper Body Dressing : Moderate assistance;Sitting   Lower Body Dressing: Maximal assistance;Sit to/from stand   Toilet Transfer: Maximal assistance;Stand-pivot   Toileting- Clothing Manipulation and Hygiene: Moderate assistance (foley)       Functional mobility during ADLs: +2 for physical assistance;Moderate assistance General ADL Comments: posterior RT lean in standing. difficulty advancing RLE     Vision                 Additional Comments: will further assess   Perception     Praxis      Pertinent Vitals/Pain Pain Assessment: No/denies pain Pain Score:  ("a little bit") Pain Location: bottom Pain Intervention(s): Monitored during session;Repositioned     Hand Dominance Right   Extremity/Trunk Assessment Upper Extremity Assessment Upper Extremity Assessment: Generalized weakness RUE Coordination: decreased fine motor;decreased gross motor   Lower Extremity Assessment RLE Deficits / Details: Ataxic with generalized wekaness    Cervical / Trunk Assessment Cervical / Trunk Assessment: Other exceptions (forward head)   Communication     Cognition Arousal/Alertness: Awake/alert Behavior During Therapy: WFL for tasks assessed/performed Overall Cognitive Status: Impaired/Different from baseline Area of Impairment: Memory;Safety/judgement;Awareness;Problem solving     Memory: Decreased short-term memory  Following Commands: Follows one step commands with increased time Safety/Judgement: Decreased awareness of safety;Decreased awareness of deficits Awareness: Emergent Problem Solving:  Slow processing;Difficulty sequencing     General Comments       Exercises   Other Exercises Other Exercises: general strengthening exercises Other Exercises: encourgaed upright sitting in recliner with feet on floor and general AROM BLE to increase strength   Shoulder Instructions      Home Living Family/patient expects to be discharged to:: Private residence Living Arrangements: Spouse/significant other Available Help at Discharge: Family;Available 24 hours/day Type of Home: House Home Access: Stairs to enter [employer] of Steps: 4 Entrance Stairs-Rails: Left Home Layout: Able to live on main level with bedroom/bathroom     Bathroom Shower/Tub: Occupational psychologist: Standard Bathroom Accessibility: Yes How Accessible: Accessible via wheelchair;Accessible via walker Home Equipment: [place] - 2 wheels;Bedside commode      Lives With: Spouse    Prior Functioning/Environment Level of Independence: Independent        Comments: retired Theme park manager    OT Diagnosis: Generalized weakness;Cognitive deficits;Disturbance of vision;Ataxia   OT Problem List: Decreased strength;Decreased activity tolerance;Impaired balance (sitting and/or standing);Impaired vision/perception;Decreased coordination;Decreased cognition;Decreased safety awareness;Decreased knowledge of use of DME or AE   OT Treatment/Interventions: Self-care/ADL training;Therapeutic exercise;Neuromuscular education;Energy conservation;DME and/or AE instruction;Therapeutic activities;Cognitive remediation/compensation;Visual/perceptual remediation/compensation;Patient/family education;Balance training    OT Goals(Current goals can be found in the care plan section) Acute Rehab OT Goals Patient Stated Goal: to be able to take care of myself OT Goal Formulation: With patient Time For Goal Achievement: 10/28/13 Potential to Achieve Goals: Good  OT Frequency: Min 3X/week   Barriers to D/C:             Co-evaluation              End of Session Equipment Utilized During Treatment: Gait belt Nurse Communication: Mobility status;Precautions (using target for mobility)  Activity Tolerance: Patient tolerated treatment well Patient left: in bed;with call bell/phone within reach;with family/visitor present;with nursing/sitter in room   Time: 1610-9604 OT Time Calculation (min): 21 min Charges:  OT General Charges $OT Visit: 1 Procedure OT Evaluation $Initial OT Evaluation Tier I: 1 Procedure OT Treatments $Self Care/Home Management : 8-22 mins G-Codes:    Adir Schicker,HILLARY 2013/10/20, 1:17 PM   [facility], OTR/L  [phone] 2013-10-20

## 2013-10-14 NOTE — Progress Notes (Signed)
[place] TEAM 1 - Stepdown/ICU TEAM Progress Note  Taylor Keller [id number] DOB: 1935/05/09 DOA: 10/07/2013 PCP: Odette Fraction, MD  Admit HPI / Brief Narrative: 78 year old WM PMHx cardiac arrest 2nd to MI in July 2015 (tx at outside hospital), and cerebellar hemorrhage 09/25/2013, after which he was discharged for to inpatient rehab 8/25. While there, he developed fevers and lethargy. Medical staff there was concerned for sepsis, PCCM called to readmit on 10/07/2013.      HPI/Subjective: 9/8 lethargic, but arousable answers all questions appropriately, no complaints  Assessment/Plan: Rt Renal mass - renal cell CA  -Patient and wife aware that mass is most likely a neoplasm. Schedule for biopsy today.  -Per oncology note 9/7 reveals that mass is so large that it may not be resectable.  Septic shock - unclear etiology  -Resolved, patient has been afebrile over the last 24 hours. Negative leukocytosis.  - UA negative 8/31 but 21-50 WBC on 9/2 - urine culture negative - blood culture apparently "cancelled" - CXR not c/w pneumonia or aspiration  - Will complete empiric abx for 7 days of tx , and stop date 9/9  Stress cardiac ischemia - CAD s/p cardiac arrest July 2015 s/p DES x2  -V fib cardiac arrest on 08/21/2013 in [place] [facility] for several minutes - cardiac cath on 08/25/2013 and received drug-eluting stent to LAD and another drug-eluting stent to left circumflex artery - residual 50% stenosis in the RCA  - patient was discharged on 09/05/2013  - plavix to be resumed once bx obtained   Acute resp failure in setting of septic shock  -Intubated 9/1 - extubated 9/3  - resp status stable   Acute encephalopathy  -secondary to hypotension in setting carotid dz  - mental status much improved; remains somewhat lethargic however answers questions appropriately   Dysphagia  - patient evaluated by speech on 9/8 and recommend continued n.p.o.  Recent R cerebellar  bleed without hydrocephalus  -Due to malignant HTN in setting of ASA/Plavix - stable at this time   Hypovolemia, hyponatremia  -Resolved    Hypokalemia  -Mildly hypokalemic, patient n.p.o. at this point and does not want replacement via IV we'll reevaluate in the a.m.   Anemia - chronic  -Stable  Coagulapathy  Due to sepsis? - resolved   Hypothyroid  -On synthroid - TSH 4.173  -Continue Synthroid 175 mcg daily  Severe malnutrition in the context of acute illness -Restart tube feeds following biopsy   Code Status: FULL Family Communication: no family present at time of exam Disposition Plan: Per oncology    Consultants: Dr. Louis Meckel (Urology)  Dr. Burney Gauze (Oncology) Dr. Merrie Roof (PCCM)  Dr. Rosalin Hawking (Neurology)   Procedure/Significant Events: 8/25 - discharged from NICU to inpatient rehab  8/31 - CT head - No new intracranial hemorrhage  9/1 - readmitted to NICU for presumed septic shock, resp failure, fever 103 - intubated  9/2 - improved on pressors  9/2 EEG > generalized irregular slow activity  9/2 [place] abdo - Multiple gallstones. 4.2 cm right renal mass suspicious for renal cell carcinoma. Two large solid retroperitoneal mass lesions, the largest measures 9.6 cm in diameter. Metastatic disease, primary retroperitoneal tumor, and lymphoma could present in this fashion.  9/3 - improved neurostatus - extubated  9/3 CT - 7.4 cm enhancing mass is seen involving the right kidney consistent with renal cell carcinoma. It may involve the renal vein in the hilum, but does not appear to extend  significantly centrally. 3.7 cm right adrenal mass is noted consistent with metastatic disease. Large multiloculated mass is noted in the right retroperitoneal region measuring 10 x 5.9 cm consistent with metastatic disease. 9/4 echocardiogram;Left ventricle: Abnormal septal motion and inferior wall hypokinesis. -severe LVH. -LVEF= 45% to 50%. -(grade 1 diastolic  dysfunction). - Atrial septum: There was increased thickness of the septum,consistent with lipomatous hypertrophy.    Culture   Antibiotics: ceftaz 9/02 >>stop date 9/9   DVT prophylaxis: SCDs    Devices    LINES / TUBES:      Continuous Infusions: . sodium chloride 10 mL/hr at 10/14/13 5366    Objective: VITAL SIGNS: Temp: 98.2 F (36.8 C) (09/08 0700) Temp src: Oral (09/08 0700) BP: 145/69 mmHg (09/08 0422) Pulse Rate: 87 (09/08 0422) SPO2; 97% on room air FIO2:   Intake/Output Summary (Last 24 hours) at 10/14/13 0907 Last data filed at 10/14/13 0736  Gross per 24 hour  Intake   1297 ml  Output    825 ml  Net    472 ml     Exam: General: Lethargic, arousable, answers questions appropriately cooperates with exam. No acute respiratory distress Lungs: Clear to auscultation bilaterally except for bibasilar crackles  Lt> Rt, without wheezes  Cardiovascular: Regular rate and rhythm without murmur gallop or rub normal S1 and S2 Abdomen: Nontender, nondistended, soft, bowel sounds positive, no rebound, no ascites, no appreciable mass, positive right CVA tenderness to palpation Extremities: No significant cyanosis, clubbing, or edema bilateral lower extremities  Data Reviewed: Basic Metabolic Panel:  Recent Labs Lab 10/10/13 0059 10/11/13 0445 10/12/13 0500 10/13/13 0545 10/14/13 0445  NA 133* 136* 137 138 137  K 3.7 3.2* 3.4* 3.4* 3.4*  CL 101 97 98 100 98  CO2 20 24 25 26 27   GLUCOSE 105* 93 111* 142* 113*  BUN 22 19 25* 26* 26*  CREATININE 0.98 1.03 1.08 0.88 0.81  CALCIUM 8.4 8.8 8.7 8.7 8.4  MG  --  1.9  --   --   --   PHOS  --  3.3  --   --   --    Liver Function Tests:  Recent Labs Lab 10/07/13 1929 10/09/13 0500 10/10/13 0059 10/12/13 0500 10/13/13 0545  AST 24 24 21 15 15   ALT 19 18 15 13 13   ALKPHOS 60  60 54 53 59 63  BILITOT 0.5 0.3 0.4 0.5 0.3  PROT 7.6 6.4 6.2 7.1 6.9  ALBUMIN 2.6* 1.9* 1.9* 2.2* 2.1*    Recent  Labs Lab 10/07/13 1929  LIPASE 36  AMYLASE 60   No results found for this basename: AMMONIA,  in the last 168 hours CBC:  Recent Labs Lab 10/07/13 1834  10/09/13 0500 10/10/13 0059 10/12/13 0500 10/13/13 0545 10/14/13 0445  WBC 20.5*  < > 11.2* 9.5 7.8 7.9 7.3  NEUTROABS 18.9*  --  10.1* 8.3*  --   --   --   HGB 11.0*  < > 9.9* 9.3* 9.4* 9.5* 9.1*  HCT 34.6*  < > 31.5* 29.6* 29.8* 31.1* 29.9*  MCV 85.6  < > 86.1 85.8 83.9 87.1 85.9  PLT 394  < > 228 226 290 320 325  < > = values in this interval not displayed. Cardiac Enzymes:  Recent Labs Lab 10/09/13 1244 10/09/13 1940 10/10/13 0059 10/12/13 0500 10/13/13 0545  CKTOTAL 52 49 42  --   --   CKMB 6.4* 7.1* 5.5*  --   --   TROPONINI 1.77*  1.91* 1.61* 0.32* <0.30   BNP (last 3 results)  Recent Labs  09/08/13 0525 09/10/13 0419  PROBNP 15176.0* 9146.0*   CBG:  Recent Labs Lab 10/13/13 1505 10/13/13 2042 10/13/13 2324 10/14/13 0424 10/14/13 0734  GLUCAP 151* 124* 111* 118* 115*    Recent Results (from the past 240 hour(s))  URINE CULTURE     Status: None   Collection Time    10/06/13  5:21 PM      Result Value Ref Range Status   Specimen Description URINE, CATHETERIZED   Final   Special Requests septra   Final   Culture  Setup Time     Final   Value: 10/06/2013 18:37     Performed at [place]     Final   Value: NO GROWTH     Performed at [facility]   Culture     Final   Value: NO GROWTH     Performed at [facility]   Report Status 10/07/2013 FINAL   Final  CULTURE, BLOOD (ROUTINE X 2)     Status: None   Collection Time    10/06/13  6:41 PM      Result Value Ref Range Status   Specimen Description BLOOD LEFT ARM   Final   Special Requests     Final   Value: BOTTLES DRAWN AEROBIC AND ANAEROBIC 10CC PT ON SEPTRA   Culture  Setup Time     Final   Value: 10/07/2013 00:25     Performed at [facility]   Culture     Final   Value: NO GROWTH  5 DAYS     Performed at [facility]   Report Status 10/13/2013 FINAL   Final  CULTURE, BLOOD (ROUTINE X 2)     Status: None   Collection Time    10/06/13  6:47 PM      Result Value Ref Range Status   Specimen Description BLOOD LEFT RIGHT ARM   Final   Special Requests BOTTLES DRAWN AEROBIC ONLY 3CC   Final   Culture  Setup Time     Final   Value: 10/07/2013 00:25     Performed at [facility]   Culture     Final   Value: NO GROWTH 5 DAYS     Performed at [facility]   Report Status 10/13/2013 FINAL   Final  CULTURE, BLOOD (ROUTINE X 2)     Status: None   Collection Time    10/07/13  7:49 PM      Result Value Ref Range Status   Specimen Description BLOOD LEFT A-LINE   Final   Special Requests BOTTLES DRAWN AEROBIC AND ANAEROBIC 10CC   Final   Culture  Setup Time     Final   Value: 10/08/2013 01:08     Performed at [facility]   Culture     Final   Value:        BLOOD CULTURE RECEIVED NO GROWTH TO DATE CULTURE WILL BE HELD FOR 5 DAYS BEFORE ISSUING A FINAL NEGATIVE REPORT     Performed at [facility]   Report Status PENDING   Incomplete  CULTURE, BLOOD (ROUTINE X 2)     Status: None   Collection Time    10/07/13  9:18 PM      Result Value Ref Range Status   Specimen Description BLOOD RIGHT UPPER WRIST   Final  Special Requests BOTTLES DRAWN AEROBIC ONLY 3CC   Final   Culture  Setup Time     Final   Value: 10/08/2013 01:09     Performed at [facility]   Culture     Final   Value:        BLOOD CULTURE RECEIVED NO GROWTH TO DATE CULTURE WILL BE HELD FOR 5 DAYS BEFORE ISSUING A FINAL NEGATIVE REPORT     Performed at [facility]   Report Status PENDING   Incomplete  CULTURE, RESPIRATORY (NON-EXPECTORATED)     Status: None   Collection Time    10/07/13 10:10 PM      Result Value Ref Range Status   Specimen Description TRACHEAL ASPIRATE   Final   Special Requests NONE   Final   Gram Stain     Final   Value: FEW WBC  PRESENT,BOTH PMN AND MONONUCLEAR     RARE SQUAMOUS EPITHELIAL CELLS PRESENT     RARE YEAST     Performed at [facility]   Culture     Final   Value: MODERATE CANDIDA ALBICANS     Performed at [facility]   Report Status 10/10/2013 FINAL   Final     Studies:  Recent x-ray studies have been reviewed in detail by the Attending Physician  Scheduled Meds:  Scheduled Meds: . antiseptic oral rinse  7 mL Mouth Rinse QID  . aspirin  81 mg Per Tube Daily  . atorvastatin  80 mg Per Tube QPM  . chlorhexidine  15 mL Mouth Rinse BID  . feeding supplement (PRO-STAT SUGAR FREE 64)  30 mL Per Tube TID  . heparin  5,000 Units Subcutaneous 3 times per day  . levothyroxine  175 mcg Per Tube QAC breakfast  . metoprolol tartrate  12.5 mg Per Tube BID  . nitroGLYCERIN  0.5 inch Topical 4 times per day  . pantoprazole (PROTONIX) IV  40 mg Intravenous Q24H  . sodium chloride  10-40 mL Intracatheter Q12H    Time spent on care of this patient: 40 mins   Allie Bossier , MD   [facility] Office  [phone] Pager - [phone]  On-Call/Text Page:      [email or web address]      password [email or web address]  If 7PM-7AM, please contact night-coverage [email or web address] Password [email or web address] 10/14/2013, 9:07 AM   LOS: 7 days

## 2013-10-14 NOTE — Procedures (Signed)
Interventional Radiology Procedure Note  Procedure: CT guided core bx of right retroperitoneal mass Complications: None Recommendations: - Bedrest x 4 hrs - Resume Plavix tomorrow 9/9 - Path pending  Signed,  Criselda Peaches, MD [facility]

## 2013-10-14 NOTE — Progress Notes (Signed)
Speech Language Pathology Treatment: Dysphagia  Patient Details Name: Taylor Keller MRN: [id number] DOB: 01/04/1936 Today's Date: 10/14/2013 Time: 8127-5170 SLP Time Calculation (min): 24 min  Assessment / Plan / Recommendation Clinical Impression  SLP provided multimodal instruction to introduce pharyngeal strengthening exercises. Pt performed oral care via suctioning with Min cues for thorough cleaning. No therapeutic trials were given as patient is NPO pending biopsy today. Pt performed pharyngeal exercises targeting base of tongue strength, pharyngeal strength, and hyolaryngeal movement with Min cues from SLP. Written information and instructions were left with the patient and his wife. Will continue to follow.   HPI HPI: 78 yo M with cerebellar hemorrhage. MRI shows 2.6 x 1.9 cm hematoma in the inferior right cerebellum. Pt also has a history of recent MI and cardiac arrest, intubated 7 days, who was discharged from [facility] general hospital on 7/31,  Pt was d/c'd home on Honey thick liquids. Readmitted to [facility] with CHF.  Found to have dysphagia with silent aspiration s/p extubation.but underwent an MBS on 8/3 showing mild sensori-motor pharyngeal dysphagia, characterized by decreased base of tongue contraction to the pharyngeal wall and decreased laryngeal elevation and epiglottic deflection. Pt recommended to consume a dys 3 diet with thin liquids with a chin tuck and second swallow to clear residue. Pt was progressing well on CIR until he became septic and was transferred back to acute care with two-day intubation. SLP was ordered to assess readiness to restart PO diet. Pt showed s/s of aspiration with BSE on 9/3 and remained NPO.   Pertinent Vitals Pain Assessment: 0-10 Pain Score:  ("a little bit") Pain Location: bottom Pain Intervention(s): Monitored during session;Repositioned  SLP Plan  Continue with current plan of care    Recommendations Diet recommendations: NPO Medication  Administration: Via alternative means              Oral Care Recommendations: Oral care Q4 per protocol Follow up Recommendations: Inpatient Rehab Plan: Continue with current plan of care    GO     [name], M.A. CCC-SLP [phone]  [name] 10/14/2013, 11:48 AM

## 2013-10-15 ENCOUNTER — Ambulatory Visit (HOSPITAL_COMMUNITY): Payer: Medicare HMO

## 2013-10-15 LAB — COMPREHENSIVE METABOLIC PANEL
ALK PHOS: 56 U/L (ref 39–117)
ALT: 9 U/L (ref 0–53)
AST: 13 U/L (ref 0–37)
Albumin: 2 g/dL — ABNORMAL LOW (ref 3.5–5.2)
Anion gap: 11 (ref 5–15)
BILIRUBIN TOTAL: 0.4 mg/dL (ref 0.3–1.2)
BUN: 24 mg/dL — ABNORMAL HIGH (ref 6–23)
CHLORIDE: 102 meq/L (ref 96–112)
CO2: 26 meq/L (ref 19–32)
Calcium: 8.6 mg/dL (ref 8.4–10.5)
Creatinine, Ser: 0.8 mg/dL (ref 0.50–1.35)
GFR, EST NON AFRICAN AMERICAN: 85 mL/min — AB (ref >90)
GLUCOSE: 134 mg/dL — AB (ref 70–99)
POTASSIUM: 3.2 meq/L — AB (ref 3.7–5.3)
Sodium: 139 mEq/L (ref 137–147)
Total Protein: 6.7 g/dL (ref 6.0–8.3)

## 2013-10-15 LAB — GLUCOSE, CAPILLARY
GLUCOSE-CAPILLARY: 136 mg/dL — AB (ref 70–99)
GLUCOSE-CAPILLARY: 142 mg/dL — AB (ref 70–99)
Glucose-Capillary: 133 mg/dL — ABNORMAL HIGH (ref 70–99)
Glucose-Capillary: 121 mg/dL — ABNORMAL HIGH (ref 70–99)

## 2013-10-15 LAB — CBC WITH DIFFERENTIAL/PLATELET
BASOS ABS: 0 10*3/uL (ref 0.0–0.1)
BASOS PCT: 0 % (ref 0–1)
Eosinophils Absolute: 0 10*3/uL (ref 0.0–0.7)
Eosinophils Relative: 1 % (ref 0–5)
HEMATOCRIT: 29.5 % — AB (ref 39.0–52.0)
Hemoglobin: 9.1 g/dL — ABNORMAL LOW (ref 13.0–17.0)
Lymphocytes Relative: 11 % — ABNORMAL LOW (ref 12–46)
Lymphs Abs: 0.9 10*3/uL (ref 0.7–4.0)
MCH: 26.5 pg (ref 26.0–34.0)
MCHC: 30.8 g/dL (ref 30.0–36.0)
MCV: 85.8 fL (ref 78.0–100.0)
MONO ABS: 0.7 10*3/uL (ref 0.1–1.0)
Monocytes Relative: 9 % (ref 3–12)
NEUTROS ABS: 5.8 10*3/uL (ref 1.7–7.7)
NEUTROS PCT: 79 % — AB (ref 43–77)
PLATELETS: 346 10*3/uL (ref 150–400)
RBC: 3.44 MIL/uL — ABNORMAL LOW (ref 4.22–5.81)
RDW: 17.8 % — AB (ref 11.5–15.5)
WBC: 7.5 10*3/uL (ref 4.0–10.5)

## 2013-10-15 LAB — MAGNESIUM: MAGNESIUM: 2.2 mg/dL (ref 1.5–2.5)

## 2013-10-15 MED ORDER — CLOPIDOGREL BISULFATE 75 MG PO TABS
75.0000 mg | ORAL_TABLET | Freq: Every day | ORAL | Status: DC
  Filled 2013-10-15: qty 1

## 2013-10-15 MED ORDER — CLOPIDOGREL BISULFATE 75 MG PO TABS
75.0000 mg | ORAL_TABLET | Freq: Every day | ORAL | Status: DC
  Administered 2013-10-16 – 2013-10-21 (×5): 75 mg
  Filled 2013-10-15 (×8): qty 1

## 2013-10-15 MED ORDER — ACETAMINOPHEN 160 MG/5ML PO SOLN
650.0000 mg | ORAL | Status: DC | PRN
  Filled 2013-10-15 (×2): qty 20.3

## 2013-10-15 MED ORDER — POTASSIUM CHLORIDE 20 MEQ/15ML (10%) PO LIQD
40.0000 meq | Freq: Once | ORAL | Status: AC
  Administered 2013-10-15: 40 meq
  Filled 2013-10-15: qty 30

## 2013-10-15 NOTE — Clinical Social Work Note (Signed)
CSW met with pt and wife at the bedside. CSW introduce self and purpose of visit. CSW and the pt's wife discuss alternative options, since pt will not be going to CIR. Pt's wife reported that she would like to take the pt home with home health. Pt's wife reported that she feel confident that she would be able to provided care for the pt at home with home health in place. CSW updated the case manger. There are no additional identified needs at this time. CSW sign off.   [place], MSW, [place]

## 2013-10-15 NOTE — Progress Notes (Signed)
Rehab admissions - We have been following pt's case and noted pt's return to acute care due to sepsis on 10-07-13. We have noted his further work up for retroperitoneal masses and kidney biopsy. I reviewed pt's case with Dr. Letta Pate, rehab PA and rehab social worker. Team feels that skilled nursing is the most appropriate discharge plan in light of his declining activity tolerance. Also noted that palliative care was recommended.  I met with pt and his wife with social worker present. I shared our recommendation for SNF and wife stated that they would like to bring pt home with support. Wife agreed that pt could not tolerate the intensity of acute rehab at this time. Case manager and social worker will follow up on this plan. Pt was resting throughout this discussion and did not wake up.  We will now sign off pt's case. Thanks.  Nanetta Batty, PT Rehabilitation Admissions Coordinator [phone]

## 2013-10-15 NOTE — Progress Notes (Signed)
Physical Therapy Treatment Patient Details Name: Taylor Keller MRN: [id number] DOB: 04/12/1935 Today's Date: 10/15/2013    History of Present Illness 78 yo wm brought in via [facility] for severe HA, N/V, & dizziness. Per pt the HA began around 2130 & cont to get worse. Pt with slight RUE pronation & drift, Rt facial droop, nystagmus, RLE sensory deficit. Imaging reveals cerebellar hemorrhage. Renal Biopsy performed on 10/14/13.    PT Comments    Pt willing to participate in treatment. Pt demonstrates improvements with functional mobility and requires less assistance during treatment. Pt would benefit from inpatient therapy at [facility], however per conversation with wife, plan is d/c home with home health PT. Wife has been education on pt's expected functional level at d/c, equipment required and therapy recommendation. At d/c, pt expected to require a w/c and hospital bed at initial d/c.   Follow Up Recommendations  Home health PT, Supervision/Assist 24hr     Equipment Recommendations  Wheelchair (measurements PT);Wheelchair cushion (measurements PT);Hospital bed;Other (comment)    Recommendations for Other Services       Precautions / Restrictions Precautions Precautions: Fall Precaution Comments: c/o dizziness with movement, n/v Restrictions Weight Bearing Restrictions: No    Mobility  Bed Mobility Overal bed mobility: Needs Assistance Bed Mobility: Rolling;Supine to Sit;Sit to Supine Rolling: Min guard (Use of rails, verbal cueing)   Supine to sit: Min assist (with use of trails and verbal cueing. HOB elevated) Sit to supine: Min assist (for LE)   General bed mobility comments: pt requires extra time to complete and verbal cueing for UE placement. Pt requires steadying once in sitting and assistance to rotate hips in order to be square with EOB. Pt c/o n/v and dizziness with change of position  Transfers Overall transfer level: Needs assistance Equipment used: 2 person hand held  assist Transfers: Sit to/from [facility] Sit to Stand: +2 physical assistance;Min assist Stand pivot transfers: Min assist;+2 physical assistance (with hand hold assist)       General transfer comment: pt notes dizziness with change of position  Ambulation/Gait Ambulation/Gait assistance: Mod assist;+2 safety/equipment Ambulation Distance (Feet): 100 Feet (40', 60') Assistive device: Rolling walker (2 wheeled) Gait Pattern/deviations: Step-through pattern;Decreased step length - right;Ataxic;Leaning posteriorly;Narrow base of support Gait velocity: decreased Gait velocity interpretation: Below normal speed for age/gender General Gait Details: pt moderately unsteady, requiring AD and mod assist to improve stability and decrease R drift/lean. Pt demonstrates moderate posterior trunk lean, but able to correct with verbal cues. Pt appears to have some difficulty with initiating an efficient gait pattern, but able to improve with verbal cues. Pt requires frequent verbal cueing to look up.   (post amb pt nauseated with emesis)   Stairs            Wheelchair Mobility    Modified Rankin (Stroke Patients Only)       Balance Overall balance assessment: Needs assistance   Sitting balance-Leahy Scale: Fair       Standing balance-Leahy Scale: Poor Standing balance comment: posterior bias                    Cognition Arousal/Alertness: Awake/alert Behavior During Therapy: WFL for tasks assessed/performed                        Exercises      General Comments        Pertinent Vitals/Pain Pain Assessment: No/denies pain    Home Living  Prior Function            PT Goals (current goals can now be found in the care plan section) Acute Rehab PT Goals Patient Stated Goal: to walk 150'. to be able to take care of myself PT Goal Formulation: With patient Time For Goal Achievement: 10/24/13 Potential to  Achieve Goals: Good Progress towards PT goals: Progressing toward goals    Frequency  Min 3X/week    PT Plan Current plan remains appropriate    Co-evaluation             End of Session Equipment Utilized During Treatment: Gait belt Activity Tolerance: Patient tolerated treatment well Patient left: in bed;with call bell/phone within reach     Time: 1345-1430 PT Time Calculation (min): 45 min  Charges:                       G Codes:      Jayni Prescher 10/15/2013, 3:55 PM

## 2013-10-15 NOTE — Progress Notes (Signed)
Report called to [place], [place] receiving RN.

## 2013-10-15 NOTE — Progress Notes (Signed)
Reviewed and agree with assessment and updated POC. Patient to d/c home with w/c, hospital bed and HHPT. Will see and progress activity and home education as tolerated.  Alben Deeds, [place] DPT  [phone]

## 2013-10-15 NOTE — Progress Notes (Signed)
Attempted report to 6E 

## 2013-10-15 NOTE — Progress Notes (Signed)
[place] TEAM 1 - Stepdown/ICU TEAM Progress Note  Taylor Keller ZOX:[id number] DOB: May 22, 1935 DOA: 10/07/2013 PCP: Odette Fraction, MD  Admit HPI / Brief Narrative: 78 year old male with cardiac arrest 2nd to MI in July 2015 (tx at outside hospital), and cerebellar hemorrhage 09/25/2013, after which he was discharged for to inpatient rehab 8/25. While there, he developed fevers and lethargy. Medical staff there was concerned for sepsis, PCCM called to readmit on 10/07/2013.    Significant Events:  8/25 - discharged from NICU to inpatient rehab  8/31 - CT head - No new intracranial hemorrhage 9/1 - readmitted to NICU for presumed septic shock, resp failure, fever 103 - intubated  9/2 - improved on pressors  9/2 EEG > generalized irregular slow activity  9/2 [place] abdo - Multiple gallstones. 4.2 cm right renal mass suspicious for renal cell carcinoma. Two large solid retroperitoneal mass lesions, the largest measures 9.6 cm in diameter. Metastatic disease, primary  retroperitoneal tumor, and lymphoma could present in this fashion.  9/3 - improved neurostatus - extubated  9/3 CT - 7.4 cm enhancing mass is seen involving the right kidney consistent with renal cell carcinoma. It may involve the renal vein in the hilum, but does not appear to extend significantly centrally. 3.7 cm right adrenal mass is noted consistent with metastatic disease. Large multiloculated mass is noted in the right retroperitoneal region measuring 10 x 5.9 cm consistent with metastatic disease.  9/8 - CT guided core bx R retroperitoneal mass  HPI/Subjective: Pt much more alert and interactive today.  Had some nausea and vomiting when up with PT.  No new complaints.  No signif pain at site of bx.  No cp, sob, or abdom pain.    Assessment/Plan:  R Renal mass - renal cell CA? V/s Lymphoma  confirmed with [place] and CT - likely mets to adrenal gland and retroperitoneum - Urology feels they have little to offer and that  surgery is not indicated - Oncology consult notes bx WILL be needed - CT of the chest is negative for pulmonary metastases - IR completed CT guided bx 9/8 - awaiting results for further planning  Septic shock - unclear etiology UA negative 8/31 but 21-50 WBC on 9/2 - urine culture negative - blood culture no growth - CXR not c/w pneumonia or aspiration - completed empiric abx for 7 days of tx   Stress cardiac ischemia - CAD s/p cardiac arrest July 2015 s/p DES x2 V fib cardiac arrest on 08/21/2013 in [place] requiring CPR for several minutes - cardiac cath on 08/25/2013 and received drug-eluting stent to LAD and another drug-eluting stent to left circumflex artery - residual 50% stenosis in the RCA - patient was discharged on 09/05/2013 - troponin increased during septic episode, but has now normalized, w/ no s/sx to suggest active ischemia  - plavix resumed post bx on 9/9   Acute resp failure in setting of septic shock  Intubated 9/1 > extubated 9/3 - resp status stable    Acute encephalopathy  secondary to hypotension in setting carotid dz - mental status much improved, and gradually getting even better  Dysphagia  Was on honey thick liquid after d/c from [place] hospital July 2015 - MBS 8/3 resulted in rec for D3 w/ thin w/ chin tuck - unfortunately f/u MBS 9/5 suggests NPO status only - placed feeding tub > tube feeds -SLP to cont to follow and re-study when indicated   Recent R cerebellar bleed without hydrocephalus Due to  malignant HTN in setting of ASA/Plavix - stable at this time   Hypovolemia, hyponatremia  Resolved w/ IVF resuscitation  Hypokalemia  Replace and follow trend - due to limited intake - Mg normal   Anemia - chronic Hgb is stable - follow post bx w/ resumption of plavix  Coagulapathy Due to sepsis? - resolved  Hypothyroid On synthroid - TSH 4.173  Severe malnutrition in the context of acute illness Tube feeds initiated   Code Status: FULL Family  Communication: no family present today  Disposition Plan: stable for transfer to med bed - pt/wife desire d/c home - cont PT/OT - coordinate care of CA when bx resulted (may require transfer to [facility])  Consultants: Urology Oncology PCCM Neurology IR  Antibiotics: Zosyn 9/01 ceftaz 9/02 >>9/07  DVT prophylaxis: SCDs  Objective: Blood pressure 146/71, pulse 84, temperature 97.8 F (36.6 C), temperature source Oral, resp. rate 21, height 6' (1.829 m), weight 71 kg (156 lb 8.4 oz), SpO2 96.00%.  Intake/Output Summary (Last 24 hours) at 10/15/13 1346 Last data filed at 10/15/13 1200  Gross per 24 hour  Intake    880 ml  Output   1175 ml  Net   -295 ml   Exam: General: No acute respiratory distress - more alert today   Lungs: Clear to auscultation bilaterally without wheezes or crackles Cardiovascular: Regular rate and rhythm without murmur gallop or rub  Abdomen: Nontender, nondistended, soft, bowel sounds positive, no rebound, no ascites, no appreciable mass - puncture site R back w/o evidence of hematoma or bleeding  Extremities: No significant cyanosis, clubbing, or edema bilateral lower extremities  Data Reviewed: Basic Metabolic Panel:  Recent Labs Lab 10/10/13 0059 10/11/13 0445 10/12/13 0500 10/13/13 0545 10/14/13 0445 10/15/13 0300  NA 133* 136* 137 138 137 139  K 3.7 3.2* 3.4* 3.4* 3.4* 3.2*  CL 101 97 98 100 98 102  CO2 20 24 25 26 27 26   GLUCOSE 105* 93 111* 142* 113* 134*  BUN 22 19 25* 26* 26* 24*  CREATININE 0.98 1.03 1.08 0.88 0.81 0.80  CALCIUM 8.4 8.8 8.7 8.7 8.4 8.6  MG  --  1.9  --   --   --  2.2  PHOS  --  3.3  --   --   --   --    Liver Function Tests:  Recent Labs Lab 10/09/13 0500 10/10/13 0059 10/12/13 0500 10/13/13 0545 10/15/13 0300  AST 24 21 15 15 13   ALT 18 15 13 13 9   ALKPHOS 54 53 59 63 56  BILITOT 0.3 0.4 0.5 0.3 0.4  PROT 6.4 6.2 7.1 6.9 6.7  ALBUMIN 1.9* 1.9* 2.2* 2.1* 2.0*   Coags:  Recent Labs Lab 10/09/13 0500  10/11/13 1647 10/14/13 0445  INR 1.49 1.32 1.36   CBC:  Recent Labs Lab 10/09/13 0500 10/10/13 0059 10/12/13 0500 10/13/13 0545 10/14/13 0445 10/15/13 0300  WBC 11.2* 9.5 7.8 7.9 7.3 7.5  NEUTROABS 10.1* 8.3*  --   --   --  5.8  HGB 9.9* 9.3* 9.4* 9.5* 9.1* 9.1*  HCT 31.5* 29.6* 29.8* 31.1* 29.9* 29.5*  MCV 86.1 85.8 83.9 87.1 85.9 85.8  PLT 228 226 290 320 325 346    Cardiac Enzymes:  Recent Labs Lab 10/09/13 1244 10/09/13 1940 10/10/13 0059 10/12/13 0500 10/13/13 0545  CKTOTAL 52 49 42  --   --   CKMB 6.4* 7.1* 5.5*  --   --   TROPONINI 1.77* 1.91* 1.61* 0.32* <0.30  CBG:  Recent Labs Lab 10/14/13 2048 10/14/13 2335 10/15/13 0412 10/15/13 0756 10/15/13 1146  GLUCAP 104* 113* 133* 136* 121*    Recent Results (from the past 240 hour(s))  URINE CULTURE     Status: None   Collection Time    10/06/13  5:21 PM      Result Value Ref Range Status   Specimen Description URINE, CATHETERIZED   Final   Special Requests septra   Final   Culture  Setup Time     Final   Value: 10/06/2013 18:37     Performed at [place]     Final   Value: NO GROWTH     Performed at [facility]   Culture     Final   Value: NO GROWTH     Performed at [facility]   Report Status 10/07/2013 FINAL   Final  CULTURE, BLOOD (ROUTINE X 2)     Status: None   Collection Time    10/06/13  6:41 PM      Result Value Ref Range Status   Specimen Description BLOOD LEFT ARM   Final   Special Requests     Final   Value: BOTTLES DRAWN AEROBIC AND ANAEROBIC 10CC PT ON SEPTRA   Culture  Setup Time     Final   Value: 10/07/2013 00:25     Performed at [facility]   Culture     Final   Value: NO GROWTH 5 DAYS     Performed at [facility]   Report Status 10/13/2013 FINAL   Final  CULTURE, BLOOD (ROUTINE X 2)     Status: None   Collection Time    10/06/13  6:47 PM      Result Value Ref Range Status   Specimen Description BLOOD  LEFT RIGHT ARM   Final   Special Requests BOTTLES DRAWN AEROBIC ONLY 3CC   Final   Culture  Setup Time     Final   Value: 10/07/2013 00:25     Performed at [facility]   Culture     Final   Value: NO GROWTH 5 DAYS     Performed at [facility]   Report Status 10/13/2013 FINAL   Final  CULTURE, BLOOD (ROUTINE X 2)     Status: None   Collection Time    10/07/13  7:49 PM      Result Value Ref Range Status   Specimen Description BLOOD LEFT A-LINE   Final   Special Requests BOTTLES DRAWN AEROBIC AND ANAEROBIC 10CC   Final   Culture  Setup Time     Final   Value: 10/08/2013 01:08     Performed at [facility]   Culture     Final   Value: NO GROWTH 5 DAYS     Performed at [facility]   Report Status 10/14/2013 FINAL   Final  CULTURE, BLOOD (ROUTINE X 2)     Status: None   Collection Time    10/07/13  9:18 PM      Result Value Ref Range Status   Specimen Description BLOOD RIGHT UPPER WRIST   Final   Special Requests BOTTLES DRAWN AEROBIC ONLY 3CC   Final   Culture  Setup Time     Final   Value: 10/08/2013 01:09     Performed at [place]     Final  Value: NO GROWTH 5 DAYS     Performed at [facility]   Report Status 10/14/2013 FINAL   Final  CULTURE, RESPIRATORY (NON-EXPECTORATED)     Status: None   Collection Time    10/07/13 10:10 PM      Result Value Ref Range Status   Specimen Description TRACHEAL ASPIRATE   Final   Special Requests NONE   Final   Gram Stain     Final   Value: FEW WBC PRESENT,BOTH PMN AND MONONUCLEAR     RARE SQUAMOUS EPITHELIAL CELLS PRESENT     RARE YEAST     Performed at [facility]   Culture     Final   Value: MODERATE CANDIDA ALBICANS     Performed at [facility]   Report Status 10/10/2013 FINAL   Final     Studies:  Recent x-ray studies have been reviewed in detail by the Attending Physician  Scheduled Meds:  Scheduled Meds: . antiseptic oral rinse  7 mL  Mouth Rinse QID  . aspirin  81 mg Per Tube Daily  . atorvastatin  80 mg Per Tube QPM  . chlorhexidine  15 mL Mouth Rinse BID  . feeding supplement (PRO-STAT SUGAR FREE 64)  30 mL Per Tube TID  . heparin  5,000 Units Subcutaneous 3 times per day  . levothyroxine  175 mcg Per Tube QAC breakfast  . metoprolol tartrate  12.5 mg Per Tube BID  . nitroGLYCERIN  0.5 inch Topical 4 times per day  . pantoprazole (PROTONIX) IV  40 mg Intravenous Q24H  . sodium chloride  10-40 mL Intracatheter Q12H    Time spent on care of this patient: 35 mins   Aubrii Sharpless T , MD   [facility] Office  [phone] Pager - Text Page per Shea Evans as per below:  On-Call/Text Page:      [email or web address]      password [email or web address]  If 7PM-7AM, please contact night-coverage [email or web address] Password [email or web address] 10/15/2013, 1:46 PM   LOS: 8 days

## 2013-10-16 LAB — BASIC METABOLIC PANEL
Anion gap: 12 (ref 5–15)
BUN: 23 mg/dL (ref 6–23)
CALCIUM: 8.3 mg/dL — AB (ref 8.4–10.5)
CHLORIDE: 102 meq/L (ref 96–112)
CO2: 25 meq/L (ref 19–32)
Creatinine, Ser: 0.73 mg/dL (ref 0.50–1.35)
GFR calc Af Amer: >90 mL/min (ref >90)
GFR calc non Af Amer: 88 mL/min — ABNORMAL LOW (ref >90)
GLUCOSE: 120 mg/dL — AB (ref 70–99)
Potassium: 4.2 mEq/L (ref 3.7–5.3)
Sodium: 139 mEq/L (ref 137–147)

## 2013-10-16 LAB — CBC
HCT: 28.4 % — ABNORMAL LOW (ref 39.0–52.0)
HEMOGLOBIN: 8.7 g/dL — AB (ref 13.0–17.0)
MCH: 26.5 pg (ref 26.0–34.0)
MCHC: 30.6 g/dL (ref 30.0–36.0)
MCV: 86.6 fL (ref 78.0–100.0)
PLATELETS: 354 10*3/uL (ref 150–400)
RBC: 3.28 MIL/uL — ABNORMAL LOW (ref 4.22–5.81)
RDW: 18.1 % — ABNORMAL HIGH (ref 11.5–15.5)
WBC: 7.4 10*3/uL (ref 4.0–10.5)

## 2013-10-16 LAB — GLUCOSE, CAPILLARY
GLUCOSE-CAPILLARY: 151 mg/dL — AB (ref 70–99)
GLUCOSE-CAPILLARY: 124 mg/dL — AB (ref 70–99)
Glucose-Capillary: 108 mg/dL — ABNORMAL HIGH (ref 70–99)
Glucose-Capillary: 109 mg/dL — ABNORMAL HIGH (ref 70–99)
Glucose-Capillary: 115 mg/dL — ABNORMAL HIGH (ref 70–99)
Glucose-Capillary: 145 mg/dL — ABNORMAL HIGH (ref 70–99)

## 2013-10-16 NOTE — Progress Notes (Signed)
Speech Language Pathology Treatment: Dysphagia  Patient Details Name: KEISUKE HOLLABAUGH MRN: [id number] DOB: [birth year] Today's Date: 10/16/2013 Time: 3545-6256 SLP Time Calculation (min): 27 min  Assessment / Plan / Recommendation Clinical Impression  Pt was seen at bedside for pharyngeal strengthening exercises and diagnostic PO trials. Pt and wife report that he has been practicing his exercises since last visit, and pt verbalized all three exercises with Mod I. Pt performed effortful swallow with SLP administration of single ice chips, with no wet vocal quality noted. Pt proceeded to consume small amounts of thin liquids, nectar thick liquids, and pureed solids with persistent, but reduced signs of pharyngeal dysphagia with Min cues from SLP for effortful swallows. No oral holding was noted. Patient is showing subtle signs of improvement at bedside, and given that d/c plan is now to return home, patient will benefit from repeat MBS to determine readiness for PO diet versus need for longer-term alternative means of feeding.   HPI HPI: 78 yo M with cerebellar hemorrhage. MRI shows 2.6 x 1.9 cm hematoma in the inferior right cerebellum. Pt also has a history of recent MI and cardiac arrest, intubated 7 days, who was discharged from [facility] general hospital on 7/31,  Pt was d/c'd home on Honey thick liquids. Readmitted to [facility] with CHF.  Found to have dysphagia with silent aspiration s/p extubation.but underwent an MBS on 8/3 showing mild sensori-motor pharyngeal dysphagia, characterized by decreased base of tongue contraction to the pharyngeal wall and decreased laryngeal elevation and epiglottic deflection. Pt recommended to consume a dys 3 diet with thin liquids with a chin tuck and second swallow to clear residue. Pt was progressing well on CIR until he became septic and was transferred back to acute care with two-day intubation. SLP was ordered to assess readiness to restart PO diet. Pt showed s/s  of aspiration with BSE on 9/3 and remained NPO.   Pertinent Vitals Pain Assessment: No/denies pain  SLP Plan  MBS;New goals to be determined pending instrumental study    Recommendations Diet recommendations: NPO Medication Administration: Via alternative means              Oral Care Recommendations: Oral care Q4 per protocol Follow up Recommendations: Home health SLP Plan: MBS;New goals to be determined pending instrumental study    GO      [name], M.A. CCC-SLP [phone]  [name] 10/16/2013, 4:54 PM

## 2013-10-16 NOTE — Progress Notes (Signed)
Physical Therapy Treatment Patient Details Name: Taylor Keller MRN: [id number] DOB: Jun 21, 1935 Today's Date: 10/16/2013    History of Present Illness 78 yo wm brought in via [facility] for severe HA, N/V, & dizziness. Per pt the HA began around 2130 & cont to get worse. Pt with slight RUE pronation & drift, Rt facial droop, nystagmus, RLE sensory deficit. Imaging reveals cerebellar hemorrhage. Renal Biopsy performed on 10/14/13.    PT Comments    Please refer to earlier PT note for pt. Progress today.  Back in to discuss with wife her comfort with pt's current mobility status.  She says she believes she can manage pt. With transfers but does not intent to attempt to walk with pt.  She has no one in the home to assist her.  She prefers not to entertain SNF for rehab though pt.  Is open to this idea.  We have agreed that PT will follow up with a family ed session in the am as she wants to practice transfers hands on.  I have informed Alesia CM of my concern and follow up session with wife and pt.    Follow Up Recommendations  Home health PT;Supervision/Assistance - 24 hour;Supervision for mobility/OOB     Equipment Recommendations  Hospital bed;Wheelchair (measurements PT);Wheelchair cushion (measurements PT)    Recommendations for Other Services       Precautions / Restrictions Precautions Precautions: Fall Precaution Comments: c/o dizziness with movement, n/v Restrictions Weight Bearing Restrictions: No Other Position/Activity Restrictions: tolerates slow movements best    Mobility  Bed Mobility Overal bed mobility: Needs Assistance Bed Mobility: Supine to Sit     Supine to sit: Min assist;+2 for physical assistance     General bed mobility comments: Increased time and +2 min assist to move supine to sit with assist at shoulders and hips needed  Transfers Overall transfer level: Needs assistance Equipment used: Rolling walker (2 wheeled) Transfers: Sit to/from Stand Sit to  Stand: +2 physical assistance;Min assist         General transfer comment: pt notes dizziness with change of position  Ambulation/Gait Ambulation/Gait assistance: Mod assist;+2 physical assistance Ambulation Distance (Feet): 25 Feet Assistive device: Rolling walker (2 wheeled) Gait Pattern/deviations: Step-to pattern;Decreased step length - right;Decreased step length - left;Staggering left;Staggering right;Narrow base of support;Trunk flexed Gait velocity: decreased   General Gait Details: Pt. with initial significant unsteadiness upon standing with heavy lean and LOB toward right side; continued to have decreased balance throughout walk but to lesser degree.  +2 assist needed to keep him stabilized    Stairs            Wheelchair Mobility    Modified Rankin (Stroke Patients Only)       Balance                                    Cognition Arousal/Alertness: Awake/alert Behavior During Therapy: WFL for tasks assessed/performed Overall Cognitive Status: Impaired/Different from baseline Area of Impairment: Memory;Safety/judgement     Memory: Decreased short-term memory   Safety/Judgement: Decreased awareness of deficits;Decreased awareness of safety          Exercises      General Comments        Pertinent Vitals/Pain Pain Assessment: No/denies pain    Home Living                      Prior  Function            PT Goals (current goals can now be found in the care plan section) Progress towards PT goals: Progressing toward goals    Frequency  Min 3X/week    PT Plan Current plan remains appropriate    Co-evaluation             End of Session Equipment Utilized During Treatment: Gait belt Activity Tolerance: Patient tolerated treatment well;Patient limited by fatigue Patient left: in chair;with call bell/phone within reach;with chair alarm set;with family/visitor present     Time: 0955-1005 PT Time Calculation  (min): 10 min  Charges:  $Gait Training: 23-37 mins $Self Care/Home Management: 8-22                    G Codes:      Ladona Ridgel 10/16/2013, 10:07 AM Gerlean Ren PT Acute Rehab Services [phone] [place] [phone]

## 2013-10-16 NOTE — Progress Notes (Signed)
Physical Therapy Treatment Patient Details Name: Taylor Keller MRN: [id number] DOB: 1935-06-05 Today's Date: 10/16/2013    History of Present Illness 78 yo wm brought in via [facility] for severe HA, N/V, & dizziness. Per pt the HA began around 2130 & cont to get worse. Pt with slight RUE pronation & drift, Rt facial droop, nystagmus, RLE sensory deficit. Imaging reveals cerebellar hemorrhage. Renal Biopsy performed on 10/14/13.    PT Comments    Pilar Plate discussion with wife regarding pt's decreased balance and risk to both of them of a fall.  She continues to plan to take pt. Home.  She has purchased a gait belt.  Will continue with family education in preparation for DC home.    Follow Up Recommendations  Home health PT;Supervision/Assistance - 24 hour;Supervision for mobility/OOB     Equipment Recommendations  Hospital bed;Wheelchair (measurements PT);Wheelchair cushion (measurements PT)    Recommendations for Other Services       Precautions / Restrictions Precautions Precautions: Fall Precaution Comments: c/o dizziness with movement, n/v Restrictions Weight Bearing Restrictions: No Other Position/Activity Restrictions: tolerates slow movements best    Mobility  Bed Mobility Overal bed mobility: Needs Assistance Bed Mobility: Supine to Sit     Supine to sit: Min assist Sit to supine: Min assist   General bed mobility comments: Wife educated on best and saafest technique to assist pt. to EOB.  She needed cueing for good body mechanics for assisting in transitions as well as to allow/encourage pt. to do as much of the work load as he is able.    Transfers Overall transfer level: Needs assistance Equipment used: None;Rolling walker (2 wheeled) Transfers: Sit to/from [facility] Sit to Stand: Mod assist Stand pivot transfers: Mod assist       General transfer comment: Educated wife on technique for sit to stand and for stand pivot to [facility] with use of gait  belt for added stability.  Wife able to demo transfers with pt. and this therapist for safety and instruction.  Pt. assisted to 3n1 for BM.  wife assisted pt. to stand while therapist assisted in cleaning of pt.  Disussed with wife need to stabilize pt. while cleaning is being done.  Wife continues to state her confidence in managing transfers and cleaning.  Pt. with multiple trials to stand and sit for cleaning then additional BM.    Ambulation/Gait Ambulation/Gait assistance: +2 physical assistance;Min assist Ambulation Distance (Feet): 80 Feet Assistive device: Rolling walker (2 wheeled) Gait Pattern/deviations: Step-through pattern;Staggering left;Staggering right Gait velocity: decreased   General Gait Details: Pt. with persistent unsteadiness in standing and in gait, needing at least min assist of one to correct and stabilize.  Educated pt's wife on need for safety belt to increase safety.  wife says she will not plan to walk pt. due to his decrease in balance.   Stairs            Wheelchair Mobility    Modified Rankin (Stroke Patients Only)       Balance                                    Cognition Arousal/Alertness: Awake/alert Behavior During Therapy: WFL for tasks assessed/performed Overall Cognitive Status: Impaired/Different from baseline Area of Impairment: Memory;Safety/judgement         Safety/Judgement: Decreased awareness of deficits;Decreased awareness of safety  Exercises      General Comments        Pertinent Vitals/Pain Pain Assessment: No/denies pain    Home Living                      Prior Function            PT Goals (current goals can now be found in the care plan section) Progress towards PT goals: Progressing toward goals    Frequency  Min 3X/week    PT Plan Current plan remains appropriate    Co-evaluation             End of Session Equipment Utilized During Treatment: Gait  belt Activity Tolerance: Patient limited by fatigue Patient left: in bed;with call bell/phone within reach;with bed alarm set;with family/visitor present     Time: 1359-1439 PT Time Calculation (min): 40 min  Charges:  $Gait Training: 8-22 mins $Therapeutic Activity: 23-37 mins                    G CodesLadona Ridgel 10/16/2013, 4:39 PM Gerlean Ren PT Acute Rehab Services [phone] [place] [phone]

## 2013-10-16 NOTE — Progress Notes (Signed)
[name] TEAM 1 - Stepdown/ICU TEAM Progress Note  Taylor Keller IFO:[id number] DOB: 05-10-1935 DOA: 10/07/2013 PCP: Odette Fraction, MD  Admit HPI / Brief Narrative:  78 year old male with cardiac arrest 2nd to MI in July 2015 (tx at outside hospital), and cerebellar hemorrhage 09/25/2013 briefly his aspirin Plavix were held and then resumed prior to nursing home discharge, he was subsequently discharged to inpatient rehab 8/25. While there, he developed fevers and lethargy. Medical staff there was concerned for sepsis, PCCM called to readmit on 10/07/2013.  Patient required brief intubation. He had some baseline dysphagia which got worse after extubation, he is currently n.p.o. with an NG tube and tube feeds. During his workup he was found to have a renal mass, underwent kidney biopsy and was seen by Dr. Marin Olp.   He was kept under pulmonary critical care service, transferred to my care on 10/16/2013 on day 9 of his hospital stay.    Significant Events:   8/25 - discharged from NICU to inpatient rehab  8/31 - CT head - No new intracranial hemorrhage 9/1 - readmitted to NICU for presumed septic shock, resp failure, fever 103 - intubated  9/2 - improved on pressors  9/2 EEG > generalized irregular slow activity  9/2 [place] abdo - Multiple gallstones. 4.2 cm right renal mass suspicious for renal cell carcinoma. Two large solid retroperitoneal mass lesions, the largest measures 9.6 cm in diameter. Metastatic disease, primary  retroperitoneal tumor, and lymphoma could present in this fashion.  9/3 - improved neurostatus - extubated  9/3 CT - 7.4 cm enhancing mass is seen involving the right kidney consistent with renal cell carcinoma. It may involve the renal vein in the hilum, but does not appear to extend significantly centrally. 3.7 cm right adrenal mass is noted consistent with metastatic disease. Large multiloculated mass is noted in the right retroperitoneal region measuring 10 x 5.9 cm  consistent with metastatic disease.  9/8 - CT guided core bx R retroperitoneal mass    HPI/Subjective:  Pt much more alert and interactive today.  Denies any headache, no chest abdominal pain, generalized weakness. No shortness of breath.     Assessment/Plan:  R Renal mass - renal cell CA? V/s Lymphoma  confirmed with [place] and CT - likely mets to adrenal gland and retroperitoneum - Urology feels they have little to offer and that surgery is not indicated - Oncology consult notes bx WILL be needed - CT of the chest is negative for pulmonary metastases - IR completed CT guided bx 9/8 - awaiting results for further planning  Septic shock - unclear etiology UA negative 8/31 but 21-50 WBC on 9/2 - urine culture negative - blood culture no growth - CXR not c/w pneumonia or aspiration - completed empiric abx for 7 days of tx   Stress cardiac ischemia - CAD s/p cardiac arrest July 2015 s/p DES x2 V fib cardiac arrest on 08/21/2013 in [place] requiring CPR for several minutes - cardiac cath on 08/25/2013 and received drug-eluting stent to LAD and another drug-eluting stent to left circumflex artery - residual 50% stenosis in the RCA - patient was discharged on 09/05/2013 - troponin increased during septic episode, but has now normalized, w/ no s/sx to suggest active ischemia  - plavix resumed post bx on 9/9   Acute resp failure in setting of septic shock  Intubated 9/1 > extubated 9/3 - resp status stable    Acute encephalopathy  secondary to hypotension in setting carotid dz -  mental status much improved, and gradually getting even better  Dysphagia  Was on honey thick liquid after d/c from [place] hospital July 2015 - MBS 8/3 resulted in rec for D3 w/ thin w/ chin tuck - unfortunately f/u MBS 9/5 suggests NPO status only - placed feeding tub > tube feeds -SLP to cont to follow and re-study when indicated . Speech re consulted. If fails we'll look into PEG tube.  Recent R cerebellar bleed without  hydrocephalus Due to malignant HTN in setting of ASA/Plavix - stable at this time, he was on aspirin Plavix at rehabilitation prior to present hospital admission. I have noted MRI done on 10/11/2013. Will check with neurosurgery about the results.  Hypovolemia, hyponatremia  Resolved w/ IVF resuscitation  Hypokalemia  Replace and follow trend - due to limited intake - Mg normal   Anemia - chronic Hgb is stable - follow post bx w/ resumption of plavix  Coagulapathy Due to sepsis? - resolved  Hypothyroid On synthroid - TSH 4.173  Severe malnutrition in the context of acute illness Tube feeds initiated     Code Status: FULL Family Communication: no family present today  Disposition Plan: stable for transfer to med bed - pt/wife desire d/c home - cont PT/OT - coordinate care of CA when bx resulted (may require transfer to [facility])  Consultants: Urology Oncology PCCM Neurology IR  Antibiotics: Zosyn 9/01 ceftaz 9/02 >>9/07  DVT prophylaxis: SCDs  Objective: Blood pressure 147/61, pulse 86, temperature 98 F (36.7 C), temperature source Oral, resp. rate 17, height 6' (1.829 m), weight 71.442 kg (157 lb 8 oz), SpO2 96.00%.  Intake/Output Summary (Last 24 hours) at 10/16/13 1055 Last data filed at 10/15/13 1500  Gross per 24 hour  Intake     50 ml  Output    225 ml  Net   -175 ml   Exam: General: No acute respiratory distress - more alert today   Lungs: Clear to auscultation bilaterally without wheezes or crackles Cardiovascular: Regular rate and rhythm without murmur gallop or rub  Abdomen: Nontender, nondistended, soft, bowel sounds positive, no rebound, no ascites, no appreciable mass - puncture site R back w/o evidence of hematoma or bleeding  Extremities: No significant cyanosis, clubbing, or edema bilateral lower extremities  Data Reviewed: Basic Metabolic Panel:  Recent Labs Lab 10/10/13 0059 10/11/13 0445 10/12/13 0500 10/13/13 0545 10/14/13 0445  10/15/13 0300 10/16/13 0533  NA 133* 136* 137 138 137 139 139  K 3.7 3.2* 3.4* 3.4* 3.4* 3.2* 4.2  CL 101 97 98 100 98 102 102  CO2 20 24 25 26 27 26 25   GLUCOSE 105* 93 111* 142* 113* 134* 120*  BUN 22 19 25* 26* 26* 24* 23  CREATININE 0.98 1.03 1.08 0.88 0.81 0.80 0.73  CALCIUM 8.4 8.8 8.7 8.7 8.4 8.6 8.3*  MG  --  1.9  --   --   --  2.2  --   PHOS  --  3.3  --   --   --   --   --    Liver Function Tests:  Recent Labs Lab 10/10/13 0059 10/12/13 0500 10/13/13 0545 10/15/13 0300  AST 21 15 15 13   ALT 15 13 13 9   ALKPHOS 53 59 63 56  BILITOT 0.4 0.5 0.3 0.4  PROT 6.2 7.1 6.9 6.7  ALBUMIN 1.9* 2.2* 2.1* 2.0*   Coags:  Recent Labs Lab 10/11/13 1647 10/14/13 0445  INR 1.32 1.36   CBC:  Recent Labs Lab  10/10/13 0059 10/12/13 0500 10/13/13 0545 10/14/13 0445 10/15/13 0300 10/16/13 0533  WBC 9.5 7.8 7.9 7.3 7.5 7.4  NEUTROABS 8.3*  --   --   --  5.8  --   HGB 9.3* 9.4* 9.5* 9.1* 9.1* 8.7*  HCT 29.6* 29.8* 31.1* 29.9* 29.5* 28.4*  MCV 85.8 83.9 87.1 85.9 85.8 86.6  PLT 226 290 320 325 346 354    Cardiac Enzymes:  Recent Labs Lab 10/09/13 1244 10/09/13 1940 10/10/13 0059 10/12/13 0500 10/13/13 0545  CKTOTAL 52 49 42  --   --   CKMB 6.4* 7.1* 5.5*  --   --   TROPONINI 1.77* 1.91* 1.61* 0.32* <0.30   CBG:  Recent Labs Lab 10/15/13 1146 10/15/13 1529 10/15/13 2015 10/15/13 2355 10/16/13 0744  GLUCAP 121* 142* 151* 108* 145*    Recent Results (from the past 240 hour(s))  URINE CULTURE     Status: None   Collection Time    10/06/13  5:21 PM      Result Value Ref Range Status   Specimen Description URINE, CATHETERIZED   Final   Special Requests septra   Final   Culture  Setup Time     Final   Value: 10/06/2013 18:37     Performed at [place]     Final   Value: NO GROWTH     Performed at [facility]   Culture     Final   Value: NO GROWTH     Performed at [facility]   Report Status 10/07/2013  FINAL   Final  CULTURE, BLOOD (ROUTINE X 2)     Status: None   Collection Time    10/06/13  6:41 PM      Result Value Ref Range Status   Specimen Description BLOOD LEFT ARM   Final   Special Requests     Final   Value: BOTTLES DRAWN AEROBIC AND ANAEROBIC 10CC PT ON SEPTRA   Culture  Setup Time     Final   Value: 10/07/2013 00:25     Performed at [facility]   Culture     Final   Value: NO GROWTH 5 DAYS     Performed at [facility]   Report Status 10/13/2013 FINAL   Final  CULTURE, BLOOD (ROUTINE X 2)     Status: None   Collection Time    10/06/13  6:47 PM      Result Value Ref Range Status   Specimen Description BLOOD LEFT RIGHT ARM   Final   Special Requests BOTTLES DRAWN AEROBIC ONLY 3CC   Final   Culture  Setup Time     Final   Value: 10/07/2013 00:25     Performed at [facility]   Culture     Final   Value: NO GROWTH 5 DAYS     Performed at [facility]   Report Status 10/13/2013 FINAL   Final  CULTURE, BLOOD (ROUTINE X 2)     Status: None   Collection Time    10/07/13  7:49 PM      Result Value Ref Range Status   Specimen Description BLOOD LEFT A-LINE   Final   Special Requests BOTTLES DRAWN AEROBIC AND ANAEROBIC 10CC   Final   Culture  Setup Time     Final   Value: 10/08/2013 01:08     Performed at [facility]  Final   Value: NO GROWTH 5 DAYS     Performed at [facility]   Report Status 10/14/2013 FINAL   Final  CULTURE, BLOOD (ROUTINE X 2)     Status: None   Collection Time    10/07/13  9:18 PM      Result Value Ref Range Status   Specimen Description BLOOD RIGHT UPPER WRIST   Final   Special Requests BOTTLES DRAWN AEROBIC ONLY 3CC   Final   Culture  Setup Time     Final   Value: 10/08/2013 01:09     Performed at [facility]   Culture     Final   Value: NO GROWTH 5 DAYS     Performed at [facility]   Report Status 10/14/2013 FINAL   Final  CULTURE, RESPIRATORY  (NON-EXPECTORATED)     Status: None   Collection Time    10/07/13 10:10 PM      Result Value Ref Range Status   Specimen Description TRACHEAL ASPIRATE   Final   Special Requests NONE   Final   Gram Stain     Final   Value: FEW WBC PRESENT,BOTH PMN AND MONONUCLEAR     RARE SQUAMOUS EPITHELIAL CELLS PRESENT     RARE YEAST     Performed at [facility]   Culture     Final   Value: MODERATE CANDIDA ALBICANS     Performed at [facility]   Report Status 10/10/2013 FINAL   Final       Scheduled Meds:  Scheduled Meds: . antiseptic oral rinse  7 mL Mouth Rinse QID  . aspirin  81 mg Per Tube Daily  . atorvastatin  80 mg Per Tube QPM  . chlorhexidine  15 mL Mouth Rinse BID  . clopidogrel  75 mg Per Tube Daily  . feeding supplement (PRO-STAT SUGAR FREE 64)  30 mL Per Tube TID  . heparin  5,000 Units Subcutaneous 3 times per day  . levothyroxine  175 mcg Per Tube QAC breakfast  . metoprolol tartrate  12.5 mg Per Tube BID  . nitroGLYCERIN  0.5 inch Topical 4 times per day  . pantoprazole (PROTONIX) IV  40 mg Intravenous Q24H  . sodium chloride  10-40 mL Intracatheter Q12H    Time spent on care of this patient: 35 mins  Kambri Dismore K M.D on 10/16/2013 at 10:55 AM  Between 7am to 7pm - Pager - [phone], After 7pm go to [email or web address] - password [email or web address]  And look for the night coverage person covering me after hours  [place]  [phone]    10/16/2013, 10:55 AM   LOS: 9 days

## 2013-10-16 NOTE — Progress Notes (Signed)
Physical Therapy Treatment Patient Details Name: Taylor Keller MRN: [id number] DOB: [birth year] Today's Date: 10/16/2013    History of Present Illness 78 yo wm brought in via [facility] for severe HA, N/V, & dizziness. Per pt the HA began around 2130 & cont to get worse. Pt with slight RUE pronation & drift, Rt facial droop, nystagmus, RLE sensory deficit. Imaging reveals cerebellar hemorrhage. Renal Biopsy performed on 10/14/13.    PT Comments    This patient required significant assist for stability in walking this am.  Concerned that wife may not be able to manage him at his current functional level.  Will plan to discuss with her.    Follow Up Recommendations  Home health PT;Supervision/Assistance - 24 hour;Supervision for mobility/OOB     Equipment Recommendations  Hospital bed;Wheelchair (measurements PT);Wheelchair cushion (measurements PT)    Recommendations for Other Services       Precautions / Restrictions Precautions Precautions: Fall Precaution Comments: c/o dizziness with movement, n/v Restrictions Weight Bearing Restrictions: No Other Position/Activity Restrictions: tolerates slow movements best    Mobility  Bed Mobility Overal bed mobility: Needs Assistance Bed Mobility: Supine to Sit     Supine to sit: Min assist;+2 for physical assistance     General bed mobility comments: Increased time and +2 min assist to move supine to sit with assist at shoulders and hips needed  Transfers Overall transfer level: Needs assistance Equipment used: Rolling walker (2 wheeled) Transfers: Sit to/from Stand Sit to Stand: +2 physical assistance;Min assist         General transfer comment: pt notes dizziness with change of position  Ambulation/Gait Ambulation/Gait assistance: Mod assist;+2 physical assistance Ambulation Distance (Feet): 25 Feet Assistive device: Rolling walker (2 wheeled) Gait Pattern/deviations: Step-to pattern;Decreased step length -  right;Decreased step length - left;Staggering left;Staggering right;Narrow base of support;Trunk flexed Gait velocity: decreased   General Gait Details: Pt. with initial significant unsteadiness upon standing with heavy lean and LOB toward right side; continued to have decreased balance throughout walk but to lesser degree.  +2 assist needed to keep him stabilized    Stairs            Wheelchair Mobility    Modified Rankin (Stroke Patients Only)       Balance                                    Cognition Arousal/Alertness: Awake/alert Behavior During Therapy: WFL for tasks assessed/performed Overall Cognitive Status: Impaired/Different from baseline Area of Impairment: Memory;Safety/judgement     Memory: Decreased short-term memory   Safety/Judgement: Decreased awareness of deficits;Decreased awareness of safety          Exercises      General Comments        Pertinent Vitals/Pain Pain Assessment: No/denies pain    Home Living                      Prior Function            PT Goals (current goals can now be found in the care plan section) Progress towards PT goals: Progressing toward goals    Frequency  Min 3X/week    PT Plan Current plan remains appropriate    Co-evaluation             End of Session Equipment Utilized During Treatment: Gait belt Activity Tolerance: Patient tolerated treatment well;Patient limited by  fatigue Patient left: in chair;with call bell/phone within reach;with chair alarm set;with family/visitor present     Time: 0842-0909 PT Time Calculation (min): 27 min  Charges:  $Gait Training: 23-37 mins                    G Codes:      Ladona Ridgel 10/16/2013, 9:51 AM Gerlean Ren PT Acute Rehab Services [phone] [place] [phone]

## 2013-10-17 ENCOUNTER — Ambulatory Visit (HOSPITAL_COMMUNITY): Payer: Medicare HMO

## 2013-10-17 ENCOUNTER — Inpatient Hospital Stay (HOSPITAL_COMMUNITY): Payer: Medicare HMO

## 2013-10-17 LAB — GLUCOSE, CAPILLARY
GLUCOSE-CAPILLARY: 136 mg/dL — AB (ref 70–99)
GLUCOSE-CAPILLARY: 138 mg/dL — AB (ref 70–99)
GLUCOSE-CAPILLARY: 140 mg/dL — AB (ref 70–99)
GLUCOSE-CAPILLARY: 141 mg/dL — AB (ref 70–99)
Glucose-Capillary: 102 mg/dL — ABNORMAL HIGH (ref 70–99)
Glucose-Capillary: 121 mg/dL — ABNORMAL HIGH (ref 70–99)
Glucose-Capillary: 132 mg/dL — ABNORMAL HIGH (ref 70–99)
Glucose-Capillary: 134 mg/dL — ABNORMAL HIGH (ref 70–99)

## 2013-10-17 MED ORDER — INFLUENZA VAC SPLIT QUAD 0.5 ML IM SUSY
0.5000 mL | PREFILLED_SYRINGE | INTRAMUSCULAR | Status: AC
  Administered 2013-10-18: 0.5 mL via INTRAMUSCULAR
  Filled 2013-10-17: qty 0.5

## 2013-10-17 NOTE — Progress Notes (Signed)
Note/chart reviewed.  Boris Engelmann Barnett RD, LDN Inpatient Clinical Dietitian Pager: [phone] After Hours Pager: [phone]  

## 2013-10-17 NOTE — Progress Notes (Signed)
Physical Therapy Treatment Patient Details Name: Taylor Keller MRN: [id number] DOB: [birth year] Today's Date: 10/17/2013    History of Present Illness 78 yo wm brought in via [facility] for severe HA, N/V, & dizziness. Per pt the HA began around 2130 & cont to get worse. Pt with slight RUE pronation & drift, Rt facial droop, nystagmus, RLE sensory deficit. Imaging reveals cerebellar hemorrhage. Renal Biopsy performed on 10/14/13..  Pt. for repeat MBS on 10/17/13    PT Comments    Pt. Continues to have unsteadiness in his walking/standing .  Ongoing family education for wife to prepare for home DC.  I have discussed my concern of pt's fall risk with Dr. Candiss Norse and he is in agreement that pt. Is at a high risk for fall while on his blood thinners.  He discussed with pt's wife .  Wife and patient to reconsider plan for home atn time of DC.  I have changed my recommendation to SNF as I believe this is in best safety and interest of patient.  I will however continue to work with wife on family ed unless she desires to change dispo to SNF.  Wife does not seem to have a grasp of the intensity of care she will need to provide if pt. DC's home.    Follow Up Recommendations  SNF;Supervision/Assistance - 24 hour;Supervision for mobility/OOB     Equipment Recommendations  Hospital bed;Wheelchair (measurements PT);Wheelchair cushion (measurements PT)    Recommendations for Other Services       Precautions / Restrictions Precautions Precautions: Fall Precaution Comments: dizziness and nausea with movements Restrictions Weight Bearing Restrictions: No Other Position/Activity Restrictions: tolerates slow movements best    Mobility  Bed Mobility Overal bed mobility: Needs Assistance Bed Mobility: Supine to Sit     Supine to sit: Min assist     General bed mobility comments: Wife able to demo assisting pt. supine to sit with therapist reminders for  encouraging pt. give his max effort and body  mechanics reminders  Transfers Overall transfer level: Needs assistance Equipment used: Rolling walker (2 wheeled) Transfers: Sit to/from Stand Sit to Stand: Min assist;Mod assist Stand pivot transfers: Mod assist       General transfer comment: Wife practiced sit to stand with min assist and mod assist for stand pivot to recliner chair to insure balance and stability.  Practiced x 2 reps.  Wife applied safety belt correcltly .  Needed reminder to keep hold of  safety belt during transfers  Ambulation/Gait Ambulation/Gait assistance: Mod assist;+2 safety/equipment Ambulation Distance (Feet): 100 Feet (40' then 23' with seated rest) Assistive device: Rolling walker (2 wheeled) Gait Pattern/deviations: Step-through pattern;Trunk flexed;Staggering left;Staggering right;Narrow base of support Gait velocity: decreased   General Gait Details: Pt. able to start out walk at min assist level but as he fatigues, he needs mod assist to maintain safe upright position.  Needed multiple brief pauses with each walk and required one seated rest.  Pt. is highly motivated to make it to the end of the hallway and seems to have a poor perception of his body fatiguing.  I educated wife on this issue.     Stairs            Wheelchair Mobility    Modified Rankin (Stroke Patients Only)       Balance  Cognition Arousal/Alertness: Awake/alert Behavior During Therapy: WFL for tasks assessed/performed Overall Cognitive Status: Impaired/Different from baseline Area of Impairment: Memory;Safety/judgement         Safety/Judgement: Decreased awareness of deficits;Decreased awareness of safety          Exercises      General Comments        Pertinent Vitals/Pain Pain Assessment: No/denies pain    Home Living                      Prior Function            PT Goals (current goals can now be found in the care plan section)  Progress towards PT goals: Progressing toward goals    Frequency  Min 3X/week    PT Plan Discharge plan needs to be updated    Co-evaluation             End of Session Equipment Utilized During Treatment: Gait belt Activity Tolerance: Patient limited by fatigue Patient left: in chair;with call bell/phone within reach;with chair alarm set;with family/visitor present     Time: 0813-0909 PT Time Calculation (min): 56 min  Charges:  $Gait Training: 23-37 mins $Therapeutic Activity: 8-22 mins $Self Care/Home Management: 8-22                    G Codes:      Ladona Ridgel 10/17/2013, 10:03 AM Gerlean Ren PT Acute Rehab Services [phone] [place] [phone]

## 2013-10-17 NOTE — Progress Notes (Signed)
CARE MANAGEMENT NOTE 10/17/2013  Patient:  Taylor Keller, Taylor Keller   Account Number:  [id number]  Date Initiated:  10/13/2013  Documentation initiated by:  Ricki Miller  Subjective/Objective Assessment:   Pt admitted for sepsis. Previous admit pt was d/c from CIR.     Action/Plan:   PT recommends CIR consult. RN to ask MD to place order. CM will continue to monitor for disposition needs. CSW to look at [facility] for back up plan.   Anticipated DC Date:  10/19/2013   Anticipated DC Plan:  [place]  CM consult      [facility] Choice  HOME HEALTH   Choice offered to / List presented to:  C-1 Patient   DME arranged  [place]      DME agency  [place] arranged  [place] RN  HH-4 NURSE'S AIDE      [place] agency  [place]   Status of service:  Completed, signed off Medicare Important Message given?  YES (If response is "NO", the following Medicare IM given date fields will be blank) Date Medicare IM given:  10/13/2013 Medicare IM given by:  Ricki Miller Date Additional Medicare IM given:  10/16/2013 Additional Medicare IM given by:  Jonnie Finner  Discharge Disposition:  [place]  Per UR Regulation:  Reviewed for med. necessity/level of care/duration of stay  If discussed at [place] of Stay Meetings, dates discussed:   10/14/2013  10/16/2013    Comments:  10/16/2013 1100 NCM spoke to pt and gave permission to speak to wife. Wife is requesting to take pt home at dc. Wife agreeable to [facility] for [facility]. Requesting wheelchair for home. Notified [place] for [facility]. Jonnie Finner RN CCM Case Mgmt phone [phone]  10/16/2013 0930 [facility] to reduce readmission. Home Health to follow post dc. Jonnie Finner RN CCM Case Mgmt phone [phone]  10-15-13 [place], RN, BSN [phone] CM did speak to pt and wife in concerns of  disposition needs. Per wife she would like to take the pt home with additional care. CM will continue to monitor.

## 2013-10-17 NOTE — Progress Notes (Signed)
10/17/2013 1300 Notified [facility] with referral for wheelchair for scheduled dc 10/18/2013. Jonnie Finner RN Case Mgmt phone [phone]

## 2013-10-17 NOTE — Procedures (Signed)
Objective Swallowing Evaluation: Modified Barium Swallowing Study  Patient Details  Name: Taylor Keller MRN: [id number] Date of Birth: 03-Feb-1936  Today's Date: 10/17/2013 Time: 1030-1100 SLP Time Calculation (min): 30 min  Past Medical History:  Past Medical History  Diagnosis Date  . Lumbar stenosis     L5  . Hypothyroidism   . Hyperlipidemia   . Hypertension   . ED (erectile dysfunction)   . Internal carotid artery stenosis 5/15    bilateral, 40-59%  . Myocardial infarction 08/2013  . Heart murmur   . Stroke ~ 2011    "slight; no permanent damage"   Past Surgical History:  Past Surgical History  Procedure Laterality Date  . Tonsillectomy and adenoidectomy    . Appendectomy    . Coronary angioplasty with stent placement  08/2013    "2"  . Cataract extraction, bilateral Bilateral ~ 20135   HPI:  78 year old male with recent cardiac arrest 2nd to MI and cerebellar hemorrhage. Transferred to CIR 8/25.  Developed sepsis and resp failure and readmitted to [facility] ICU on 9/1.  OETT 9/1-9/3; during this admission found to have right renal and peritoneal mass; followed by [facility] and awaiting biopsy results. Pt has hx of neurogenic dysphagia and has been followed by SLP services since CIR admission.  Has undergone multiple MBS studies, the last of which was 9/5 and revealed a deterioration in swallow function with severe pharyngeal dysphagia (silent aspiration and severe pharyngeal residue to due poor mobility hyolaryngeal complex). Pt is currently NPO with enteral feedings.  Has been engaging in therapeutic exercise and repeat MBS recommended for 10/17/13.      Assessment / Plan / Recommendation Clinical Impression  Dysphagia Diagnosis: Moderate oral phase dysphagia;Severe pharyngeal phase dysphagia  Clinical impression: Pt presents with continued mod-severe oropharyngeal dysphagia, with marginal improvements since 9/5 study.  There continues to be reduced oral manipulation, poor  propulsion of materials throughout oropharynx, and reduced mobility of hyolaryngeal complex.  There was immediate and persistent aspiration of honey-thick liquids - yet pt is now demonstrating sensation of aspirate, which triggers a delayed/weak cough response (non-protective).  He may begin trial boluses of purees with SLP during therapy, but swallow function is not sufficient to begin POs.    Recommend continuing enteral feeding; would postpone PEG discussion as:  1) prognosis for swallow recovery is promising; 2)  new dx of renal mass and prognosis may impact pt/wife's potential decisions about long-term enteral feeding.   SLP will follow for dysphagia treatment.      Treatment Recommendation  Therapy as outlined in treatment plan below    Diet Recommendation NPO   Medication Administration: Via alternative means    Other  Recommendations Oral Care Recommendations:  (QD)          Frequency and Duration min 3x week  2 weeks       SLP Swallow Goals     General HPI: 78 year old male with recent cardiac arrest 2nd to MI and cerebellar hemorrhage. Transferred to CIR 8/25.  Developed sepsis and resp failure and readmitted to [facility] ICU on 9/1.  OETT 9/1-9/3; during this admission found to have right renal and peritoneal mass; followed by [facility] and awaiting biopsy results. Pt has hx of neurogenic dysphagia and has been followed by SLP services since CIR admission.  Has undergone multiple MBS studies, the last of which was 9/5 and revealed a deterioration in swallow function with severe pharyngeal dysphagia (silent aspiration and severe pharyngeal residue to  due poor mobility hyolaryngeal complex). Pt is currently NPO with enteral feedings.  Has been engaging in therapeutic exercise and repeat MBS recommended for today.   Type of Study: Modified Barium Swallowing Study Reason for Referral: Objectively evaluate swallowing function Previous Swallow Assessment: 9/5: Pt.'s swallow function has  decreased from [facility] 09/08/13.  Significantly reduced tongue base retraction, laryngeal elevation and epiglottic deflection led to moderate-severe silent aspiration during and after the swallow from vallecular and pyriform sinus residue.  Chin tuck posture and cues to cough were unsuccessful in mitigating and removing aspiration.  Pt. not safe with any po consistency at this time, discussed with MD temporary means of nutrition; continue oral care and ST for trial pharyngeal strengthening exercises due to motor weakness. Diet Prior to this Study: NPO;Panda Temperature Spikes Noted: No Respiratory Status: Room air History of Recent Intubation: Yes Length of Intubations (days): 2 days Date extubated: 10/09/13 Behavior/Cognition: Alert;Cooperative;Pleasant mood Oral Cavity - Dentition: Adequate natural dentition Oral Motor / Sensory Function: Impaired - see Bedside swallow eval Self-Feeding Abilities: Able to feed self Patient Positioning: Upright in chair Baseline Vocal Quality: Low vocal intensity;Clear Volitional Cough: Weak Volitional Swallow: Able to elicit Anatomy: Within functional limits Pharyngeal Secretions: Not observed secondary MBS    Reason for Referral Objectively evaluate swallowing function   Oral Phase Oral Preparation/Oral Phase Oral Phase: Impaired Oral - Honey Oral - Honey Teaspoon: Impaired mastication;Weak lingual manipulation;Lingual pumping;Reduced posterior propulsion Oral - Honey Cup: Not tested Oral - Solids Oral - Puree: Impaired mastication;Weak lingual manipulation;Lingual pumping;Reduced posterior propulsion   Pharyngeal Phase Pharyngeal Phase Pharyngeal Phase: Impaired Pharyngeal - Honey Pharyngeal - Honey Teaspoon: Penetration/Aspiration during swallow;Reduced laryngeal elevation;Reduced airway/laryngeal closure;Reduced tongue base retraction;Pharyngeal residue - valleculae;Pharyngeal residue - pyriform sinuses;Moderate aspiration Penetration/Aspiration  details (honey teaspoon): Material enters airway, passes BELOW cords and not ejected out despite cough attempt by patient Pharyngeal - Honey Cup: Not tested Penetration/Aspiration details (honey cup): Material enters airway, passes BELOW cords and not ejected out despite cough attempt by patient Pharyngeal - Solids Pharyngeal - Puree: Pharyngeal residue - valleculae;Reduced tongue base retraction;Pharyngeal residue - pyriform sinuses;Reduced laryngeal elevation;Reduced pharyngeal peristalsis;Reduced anterior laryngeal mobility Penetration/Aspiration details (puree): Material enters airway, remains ABOVE vocal cords and not ejected out  Cervical Esophageal Phase   Ceaser Ebeling L. Tivis Ringer, [place] CCC/SLP Pager [phone]               Juan Quam [name] 10/17/2013, 12:14 PM

## 2013-10-17 NOTE — Progress Notes (Signed)
[place] TEAM 1 - Stepdown/ICU TEAM Progress Note  EREZ MCCALLUM TDD:[id number] DOB: Mar 16, 1935 DOA: 10/07/2013 PCP: Odette Fraction, MD  Admit HPI / Brief Narrative:  78 year old male with cardiac arrest 2nd to MI in July 2015 (tx at outside hospital), and cerebellar hemorrhage 09/25/2013 briefly his aspirin Plavix were held and then resumed prior to nursing home discharge, he was subsequently discharged to inpatient rehab 8/25. While there, he developed fevers and lethargy. Medical staff there was concerned for sepsis, PCCM called to readmit on 10/07/2013.  Patient required brief intubation. He had some baseline dysphagia which got worse after extubation, he is currently n.p.o. with an NG tube and tube feeds. During his workup he was found to have a renal mass, underwent kidney biopsy and was seen by Dr. Marin Olp.   He was kept under pulmonary critical care service, transferred to my care on 10/16/2013 on day 9 of his hospital stay.    Significant Events:   8/25 - discharged from NICU to inpatient rehab  8/31 - CT head - No new intracranial hemorrhage 9/1 - readmitted to NICU for presumed septic shock, resp failure, fever 103 - intubated  9/2 - improved on pressors  9/2 EEG > generalized irregular slow activity  9/2 [place] abdo - Multiple gallstones. 4.2 cm right renal mass suspicious for renal cell carcinoma. Two large solid retroperitoneal mass lesions, the largest measures 9.6 cm in diameter. Metastatic disease, primary  retroperitoneal tumor, and lymphoma could present in this fashion.  9/3 - improved neurostatus - extubated  9/3 CT - 7.4 cm enhancing mass is seen involving the right kidney consistent with renal cell carcinoma. It may involve the renal vein in the hilum, but does not appear to extend significantly centrally. 3.7 cm right adrenal mass is noted consistent with metastatic disease. Large multiloculated mass is noted in the right retroperitoneal region measuring 10 x 5.9 cm  consistent with metastatic disease.  9/8 - CT guided core bx R retroperitoneal mass 9/5 - MRI brain noted    HPI/Subjective:  Pt much more alert and interactive today.  Denies any headache, no chest abdominal pain, generalized weakness. No shortness of breath.     Assessment/Plan:  R Renal mass - renal cell CA? V/s Lymphoma  confirmed with [place] and CT - likely mets to adrenal gland and retroperitoneum - Urology feels they have little to offer and that surgery is not indicated - Oncology consult notes bx WILL be needed - CT of the chest is negative for pulmonary metastases - IR completed CT guided bx 9/8 - awaiting results for further planning    Septic shock - unclear etiology UA negative 8/31 but 21-50 WBC on 9/2 - urine culture negative - blood culture no growth - CXR not c/w pneumonia or aspiration - completed empiric abx for 7 days of tx     Stress cardiac ischemia - CAD s/p cardiac arrest July 2015 s/p DES x2 V fib cardiac arrest on 08/21/2013 in [place] requiring CPR for several minutes - cardiac cath on 08/25/2013 and received drug-eluting stent to LAD and another drug-eluting stent to left circumflex artery - residual 50% stenosis in the RCA - patient was discharged on 09/05/2013 - troponin increased during septic episode, but has now normalized, w/ no s/sx to suggest active ischemia  - plavix resumed post bx on 9/9     Acute resp failure in setting of septic shock  Intubated 9/1 > extubated 9/3 - resp status stable  Acute encephalopathy  secondary to hypotension in setting carotid dz - mental status much improved, and gradually getting even better    Dysphagia  Was on honey thick liquid after d/c from [place] hospital July 2015 - MBS 8/3 resulted in rec for D3 w/ thin w/ chin tuck - unfortunately f/u MBS 9/5 suggests NPO status only - placed feeding tub > tube feeds -SLP to cont to follow and re-study when indicated . Speech re consulted. Due for repeat modified  barium swallow on 10/17/2013 If fails we'll look into PEG tube.    Recent R cerebellar bleed without hydrocephalus Due to malignant HTN in setting of ASA/Plavix - stable at this time, he was on aspirin Plavix at rehabilitation prior to present hospital admission. I have noted MRI done on 10/11/2013. Discussed the MRI brain results with neurosurgeon on call Dr. Arnoldo Morale on 10/16/2013. Per Dr. Arnoldo Morale repeat MRI looks improved. No acute interventions or change is needed in treatment of medications.    Hypovolemia, hyponatremia  Resolved w/ IVF resuscitation    Hypokalemia  Replace and follow trend - due to limited intake - Mg normal     Anemia - chronic Hgb is stable - follow post bx w/ resumption of plavix    Coagulapathy Due to sepsis? - resolved    Hypothyroid On synthroid - TSH 4.173    Severe malnutrition in the context of acute illness Tube feeds initiated     Code Status: FULL Family Communication: no family present today  Disposition Plan: We recommended SNF but family and patient resistant. If they refuse [facility] PT.  Consultants: Urology Oncology [facility] Neurology IR Neurosurgery Dr. Arnoldo Morale over the phone on 10/16/2013.  Antibiotics: Zosyn 9/01 ceftaz 9/02 >>9/07  DVT prophylaxis: SCDs  Objective: Blood pressure 149/66, pulse 84, temperature 98 F (36.7 C), temperature source Oral, resp. rate 16, height 6' (1.829 m), weight 70.308 kg (155 lb), SpO2 97.00%. No intake or output data in the 24 hours ending 10/17/13 1216  Exam: General: No acute respiratory distress - more alert today   Lungs: Clear to auscultation bilaterally without wheezes or crackles Cardiovascular: Regular rate and rhythm without murmur gallop or rub  Abdomen: Nontender, nondistended, soft, bowel sounds positive, no rebound, no ascites, no appreciable mass - puncture site R back w/o evidence of hematoma or bleeding  Extremities: No significant cyanosis, clubbing, or edema bilateral  lower extremities  Data Reviewed: Basic Metabolic Panel:  Recent Labs Lab 10/11/13 0445 10/12/13 0500 10/13/13 0545 10/14/13 0445 10/15/13 0300 10/16/13 0533  NA 136* 137 138 137 139 139  K 3.2* 3.4* 3.4* 3.4* 3.2* 4.2  CL 97 98 100 98 102 102  CO2 24 25 26 27 26 25   GLUCOSE 93 111* 142* 113* 134* 120*  BUN 19 25* 26* 26* 24* 23  CREATININE 1.03 1.08 0.88 0.81 0.80 0.73  CALCIUM 8.8 8.7 8.7 8.4 8.6 8.3*  MG 1.9  --   --   --  2.2  --   PHOS 3.3  --   --   --   --   --    Liver Function Tests:  Recent Labs Lab 10/12/13 0500 10/13/13 0545 10/15/13 0300  AST 15 15 13   ALT 13 13 9   ALKPHOS 59 63 56  BILITOT 0.5 0.3 0.4  PROT 7.1 6.9 6.7  ALBUMIN 2.2* 2.1* 2.0*   Coags:  Recent Labs Lab 10/11/13 1647 10/14/13 0445  INR 1.32 1.36   CBC:  Recent Labs Lab 10/12/13 0500 10/13/13  0545 10/14/13 0445 10/15/13 0300 10/16/13 0533  WBC 7.8 7.9 7.3 7.5 7.4  NEUTROABS  --   --   --  5.8  --   HGB 9.4* 9.5* 9.1* 9.1* 8.7*  HCT 29.8* 31.1* 29.9* 29.5* 28.4*  MCV 83.9 87.1 85.9 85.8 86.6  PLT 290 320 325 346 354    Cardiac Enzymes:  Recent Labs Lab 10/12/13 0500 10/13/13 0545  TROPONINI 0.32* <0.30   CBG:  Recent Labs Lab 10/16/13 1751 10/16/13 2005 10/17/13 0002 10/17/13 0414 10/17/13 0748  GLUCAP 124* 115* 121* 136* 141*    Recent Results (from the past 240 hour(s))  CULTURE, BLOOD (ROUTINE X 2)     Status: None   Collection Time    10/07/13  7:49 PM      Result Value Ref Range Status   Specimen Description BLOOD LEFT A-LINE   Final   Special Requests BOTTLES DRAWN AEROBIC AND ANAEROBIC 10CC   Final   Culture  Setup Time     Final   Value: 10/08/2013 01:08     Performed at [facility]   Culture     Final   Value: NO GROWTH 5 DAYS     Performed at [facility]   Report Status 10/14/2013 FINAL   Final  CULTURE, BLOOD (ROUTINE X 2)     Status: None   Collection Time    10/07/13  9:18 PM      Result Value Ref Range Status     Specimen Description BLOOD RIGHT UPPER WRIST   Final   Special Requests BOTTLES DRAWN AEROBIC ONLY 3CC   Final   Culture  Setup Time     Final   Value: 10/08/2013 01:09     Performed at [facility]   Culture     Final   Value: NO GROWTH 5 DAYS     Performed at [facility]   Report Status 10/14/2013 FINAL   Final  CULTURE, RESPIRATORY (NON-EXPECTORATED)     Status: None   Collection Time    10/07/13 10:10 PM      Result Value Ref Range Status   Specimen Description TRACHEAL ASPIRATE   Final   Special Requests NONE   Final   Gram Stain     Final   Value: FEW WBC PRESENT,BOTH PMN AND MONONUCLEAR     RARE SQUAMOUS EPITHELIAL CELLS PRESENT     RARE YEAST     Performed at [facility]   Culture     Final   Value: MODERATE CANDIDA ALBICANS     Performed at [facility]   Report Status 10/10/2013 FINAL   Final       Scheduled Meds:  Scheduled Meds: . antiseptic oral rinse  7 mL Mouth Rinse QID  . aspirin  81 mg Per Tube Daily  . atorvastatin  80 mg Per Tube QPM  . chlorhexidine  15 mL Mouth Rinse BID  . clopidogrel  75 mg Per Tube Daily  . feeding supplement (PRO-STAT SUGAR FREE 64)  30 mL Per Tube TID  . heparin  5,000 Units Subcutaneous 3 times per day  . [START ON 10/18/2013] Influenza vac split quadrivalent PF  0.5 mL Intramuscular Tomorrow-1000  . levothyroxine  175 mcg Per Tube QAC breakfast  . metoprolol tartrate  12.5 mg Per Tube BID  . nitroGLYCERIN  0.5 inch Topical 4 times per day  . pantoprazole (PROTONIX) IV  40 mg Intravenous Q24H  . sodium  chloride  10-40 mL Intracatheter Q12H    Time spent on care of this patient: 35 mins  Lala Lund K M.D on 10/17/2013 at 12:16 PM  Between 7am to 7pm - Pager - [phone], After 7pm go to [email or web address] - password [email or web address]  And look for the night coverage person covering me after hours  [place]  [phone]    10/17/2013, 12:16 PM   LOS: 10 days

## 2013-10-17 NOTE — Progress Notes (Signed)
NUTRITION FOLLOW-UP  DOCUMENTATION CODES Per approved criteria  -Severe malnutrition in the context of acute illness   Pt meets criteria for severe MALNUTRITION in the context of acute illness as evidenced by moderate subcutaneous fat and severe muscle depletion.  INTERVENTION: Continue Jevity 1.2 at goal rate of 65 ml/hr via NGT.  Continue 30 ml Prostat TID per tube.  TF regimen providing: 1560 ml, 2172 kcal, 131 grams protein, and 1263 ml H2O.  NUTRITION DIAGNOSIS: Inadequate oral intake related to inability to eat as evidenced by NPO status; ongoing.   Goal: Pt to meet >/= 90% of their estimated nutrition needs; met  Monitor:  TF regimen & tolerance, weight, labs, I/O's   ASSESSMENT: 78 year old male with recent cardiac arrest 2nd to MI, and cerebellar hemorrhage. In inpatient rehab, he developed fevers and lethargy. 9/1 readmitted to NICU for presumed septic shock, resp failure, fever 103.  9/7-Pt extubated 9/3, he failed swallow eval on this date. Pt does have a hx of dysphagia, repeat MBS failed 9/5.  Pt with suspected new cancer by CT, biopsy pending.  Adult TF protocol started 9/5.  Jevity 1.2 @ 50 ml/hr providing with 30 ml Prostat BID: 1200 ml, 1640 kcal, 97 grams protein, and 984 ml H2O. Per RN pt is tolerating TF well. No residuals and abdominal exam WDL.  Nasogastric feeding tube with tip in the proximal stomach.   9/11- Pt with large multiloculated mass noted in the right retroperitoneal region measuring 10 x 5.9 cm consistent with metastatic disease.   Spoke with RN, pt has been tolerating the tube feeding well with little to no residuals. Pt also with no stomach pains and no loose stools. Pt does have nausea at time, however it is not related to the tube feeding. Pt underwent a MBS, per SLP pt with moderate oral phase dysphagia and severe pharyngeal phase dysphagia and recommends to continue NPO status.   Pt is currently receiving Jevity 1.2 at goal rate of 65  ml/hr with 30 ml Prostat TID per tube providing 1560 ml, 2172 kcal, 131 grams protein, and 1263 ml H2O.   Height: Ht Readings from Last 1 Encounters:  10/14/13 6' (1.829 m)    Weight: Wt Readings from Last 1 Encounters:  10/16/13 155 lb (70.308 kg)  10/07/13  171 lb   BMI:  Body mass index is 21.02 kg/(m^2).  Re-Estimated Nutritional Needs: Kcal: 2000-2300 Protein: 115-125g Fluid: 2 L/ day  Skin: scratches, ecchymosis  Diet Order: NPO   No intake or output data in the 24 hours ending 10/17/13 1034  Last BM: 9/9  Labs:   Recent Labs Lab 10/11/13 0445  10/14/13 0445 10/15/13 0300 10/16/13 0533  NA 136*  < > 137 139 139  K 3.2*  < > 3.4* 3.2* 4.2  CL 97  < > 98 102 102  CO2 24  < > 27 26 25   BUN 19  < > 26* 24* 23  CREATININE 1.03  < > 0.81 0.80 0.73  CALCIUM 8.8  < > 8.4 8.6 8.3*  MG 1.9  --   --  2.2  --   PHOS 3.3  --   --   --   --   GLUCOSE 93  < > 113* 134* 120*  < > = values in this interval not displayed.  CBG (last 3)   Recent Labs  10/17/13 0002 10/17/13 0414 10/17/13 0748  GLUCAP 121* 136* 141*    Scheduled Meds: . antiseptic oral rinse  7 mL Mouth Rinse QID  . aspirin  81 mg Per Tube Daily  . atorvastatin  80 mg Per Tube QPM  . chlorhexidine  15 mL Mouth Rinse BID  . clopidogrel  75 mg Per Tube Daily  . feeding supplement (PRO-STAT SUGAR FREE 64)  30 mL Per Tube TID  . heparin  5,000 Units Subcutaneous 3 times per day  . [START ON 10/18/2013] Influenza vac split quadrivalent PF  0.5 mL Intramuscular Tomorrow-1000  . levothyroxine  175 mcg Per Tube QAC breakfast  . metoprolol tartrate  12.5 mg Per Tube BID  . nitroGLYCERIN  0.5 inch Topical 4 times per day  . pantoprazole (PROTONIX) IV  40 mg Intravenous Q24H  . sodium chloride  10-40 mL Intracatheter Q12H    Continuous Infusions: . sodium chloride 10 mL/hr at 10/14/13 0833  . feeding supplement (JEVITY 1.2 CAL) 1,000 mL (10/17/13 0946)   Kallie Locks, MS, Provisional LDN Pager #  [phone] After hours/ weekend pager # [phone]

## 2013-10-18 DIAGNOSIS — I6350 Cerebral infarction due to unspecified occlusion or stenosis of unspecified cerebral artery: Secondary | ICD-10-CM

## 2013-10-18 DIAGNOSIS — A419 Sepsis, unspecified organism: Secondary | ICD-10-CM | POA: Diagnosis not present

## 2013-10-18 DIAGNOSIS — C649 Malignant neoplasm of unspecified kidney, except renal pelvis: Secondary | ICD-10-CM

## 2013-10-18 LAB — GLUCOSE, CAPILLARY
GLUCOSE-CAPILLARY: 126 mg/dL — AB (ref 70–99)
GLUCOSE-CAPILLARY: 132 mg/dL — AB (ref 70–99)
Glucose-Capillary: 125 mg/dL — ABNORMAL HIGH (ref 70–99)

## 2013-10-18 NOTE — Progress Notes (Signed)
[name] - Stepdown/ICU TEAM Progress Note  Taylor Keller IHK:[id number] DOB: 02/15/1935 DOA: 10/07/2013 PCP: Taylor Fraction, MD  Admit HPI / Brief Narrative:  78 year old male with cardiac arrest 2nd to MI in July 2015 (tx at outside hospital), and cerebellar hemorrhage 09/25/2013 briefly his aspirin Plavix were held and then resumed prior to nursing home discharge, he was subsequently discharged to inpatient rehab 8/25. While there, he developed fevers and lethargy. Medical staff there was concerned for sepsis, PCCM called to readmit on 10/07/2013.  Patient required brief intubation. He had some baseline dysphagia which got worse after extubation, he is currently n.p.o. with an NG tube and tube feeds. 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 Marin Keller along with Dr. Alen Keller, preliminary consensus is that his prognosis long-term is poor. Palliative care will also be involved.    He was kept initially pulmonary critical care service, transferred to my care on 10/16/2013 on day 9 of his hospital stay.    Significant Events:   8/25 - discharged from NICU to inpatient rehab  8/31 - CT head - No new intracranial hemorrhage 9/1 - readmitted to NICU for presumed septic shock, resp failure, fever 103 - intubated  9/2 - improved on pressors  9/2 EEG > generalized irregular slow activity  9/2 [place] abdo - Multiple gallstones. 4.2 cm right renal mass suspicious for renal cell carcinoma. Two large solid retroperitoneal mass lesions, the largest measures 9.6 cm in diameter. Metastatic disease, primary  retroperitoneal tumor, and lymphoma could present in this fashion.  9/3 - improved neurostatus - extubated  9/3 CT - 7.4 cm enhancing mass is seen involving the right kidney consistent with renal cell carcinoma. It may involve the renal vein in the hilum, but does not appear to extend  significantly centrally. 3.7 cm right adrenal mass is noted consistent with metastatic disease. Large multiloculated mass is noted in the right retroperitoneal region measuring 10 x 5.9 cm consistent with metastatic disease.  9/8 - CT guided core bx R retroperitoneal mass showing clear-cell renal cell carcinoma 9/5 - MRI brain noted    HPI/Subjective:  Pt much more alert and interactive today.  Denies any headache, no chest abdominal pain, generalized weakness. No shortness of breath.     Assessment/Plan:  R Renal mass - renal cell CA? V/s Lymphoma  confirmed with [place] and CT - likely mets to adrenal gland and retroperitoneum - Urology feels they have little to offer and that surgery is not indicated - Oncology consult notes bx WILL be needed - CT of the chest is negative for pulmonary metastases - IR completed CT guided bx 9/8 pathology showing clear-cell renal cell carcinoma, oncology requested to comment. Long-term prognosis poor oncology appears bleak. Family considering hospice option also. Palliative care will be consulted.    Septic shock - unclear etiology UA negative 8/31 but 21-50 WBC on 9/2 - urine culture negative - blood culture no growth - CXR not c/w pneumonia or aspiration - completed empiric abx for 7 days of tx     Stress cardiac ischemia - CAD s/p cardiac arrest July 2015 s/p DES x2 V fib cardiac arrest on 08/21/2013 in [place] requiring CPR for several minutes - cardiac cath on 08/25/2013 and received drug-eluting stent to LAD and another drug-eluting stent to left circumflex artery - residual 50% stenosis in the RCA - patient was discharged on  09/05/2013 - troponin increased during septic episode, but has now normalized, w/ no s/sx to suggest active ischemia  - plavix resumed post bx on 9/9     Acute resp failure in setting of septic shock  Intubated 9/1 > extubated 9/3 - resp status stable      Acute encephalopathy  secondary to hypotension in setting carotid dz -  mental status much improved, and gradually getting even better    Dysphagia  Was on honey thick liquid after d/c from [place] hospital July 2015 - MBS 8/3 resulted in rec for D3 w/ thin w/ chin tuck - unfortunately f/u MBS 9/5 suggests NPO status only - placed feeding tub > tube feeds -SLP to cont to follow and re-study when indicated . Speech re consulted but he failed modified barium again on 10/17/2013, after discussions with palliative care it will be decided whether he wants PEG tube versus comfort feeds.   Recent R cerebellar bleed without hydrocephalus Due to malignant HTN in setting of ASA/Plavix - stable at this time, he was on aspirin Plavix at rehabilitation prior to present hospital admission. I have noted MRI done on 10/11/2013. Discussed the MRI brain results with neurosurgeon on call Dr. Arnoldo Keller on 10/16/2013. Per Dr. Arnoldo Keller repeat MRI looks improved. No acute interventions or change is needed in treatment of medications.    Hypovolemia, hyponatremia  Resolved w/ IVF resuscitation    Hypokalemia  Replace and stable    Anemia - chronic Hgb is stable - follow post bx w/ resumption of plavix    Coagulapathy Due to sepsis? - resolved    Hypothyroid On synthroid - TSH 4.173    Severe malnutrition in the context of acute illness Tube feeds initiated , failed modified barium swallow twice    Code Status: FULL Family Communication: no family present today  Disposition Plan: We recommended SNF but family and patient resistant. If they refuse SNF then [facility] PT vs Hospice.  Consultants: Urology Oncology [facility] Neurology IR Neurosurgery Dr. Arnoldo Keller over the phone on 10/16/2013. Palliative care  Antibiotics: Zosyn 9/01 ceftaz 9/02 >>9/07  DVT prophylaxis: SCDs  Objective: Blood pressure 140/68, pulse 84, temperature 98.7 F (37.1 C), temperature source Oral, resp. rate 17, height 6' (1.829 m), weight 71.21 kg (156 lb 15.8 oz), SpO2  96.00%.  Intake/Output Summary (Last 24 hours) at 10/18/13 1026 Last data filed at 10/18/13 0610  Gross per 24 hour  Intake     60 ml  Output    700 ml  Net   -640 ml    Exam: General: No acute respiratory distress - more alert today  , still has NG Lungs: Clear to auscultation bilaterally without wheezes or crackles Cardiovascular: Regular rate and rhythm without murmur gallop or rub  Abdomen: Nontender, nondistended, soft, bowel sounds positive, no rebound, no ascites, no appreciable mass - puncture site R back w/o evidence of hematoma or bleeding  Extremities: No significant cyanosis, clubbing, or edema bilateral lower extremities  Data Reviewed: Basic Metabolic Panel:  Recent Labs Lab 10/12/13 0500 10/13/13 0545 10/14/13 0445 10/15/13 0300 10/16/13 0533  NA 137 138 137 139 139  K 3.4* 3.4* 3.4* 3.2* 4.2  CL 98 100 98 102 102  CO2 25 26 27 26 25   GLUCOSE 111* 142* 113* 134* 120*  BUN 25* 26* 26* 24* 23  CREATININE 1.08 0.88 0.81 0.80 0.73  CALCIUM 8.7 8.7 8.4 8.6 8.3*  MG  --   --   --  2.2  --  Liver Function Tests:  Recent Labs Lab 10/12/13 0500 10/13/13 0545 10/15/13 0300  AST 15 15 13   ALT 13 13 9   ALKPHOS 59 63 56  BILITOT 0.5 0.3 0.4  PROT 7.1 6.9 6.7  ALBUMIN 2.2* 2.1* 2.0*   Coags:  Recent Labs Lab 10/11/13 1647 10/14/13 0445  INR 1.32 1.36   CBC:  Recent Labs Lab 10/12/13 0500 10/13/13 0545 10/14/13 0445 10/15/13 0300 10/16/13 0533  WBC 7.8 7.9 7.3 7.5 7.4  NEUTROABS  --   --   --  5.8  --   HGB 9.4* 9.5* 9.1* 9.1* 8.7*  HCT 29.8* 31.1* 29.9* 29.5* 28.4*  MCV 83.9 87.1 85.9 85.8 86.6  PLT 290 320 325 346 354    Cardiac Enzymes:  Recent Labs Lab 10/12/13 0500 10/13/13 0545  TROPONINI 0.32* <0.30   CBG:  Recent Labs Lab 10/17/13 1201 10/17/13 1720 10/17/13 1953 10/17/13 2338 10/18/13 0437  GLUCAP 132* 134* 102* 138* 125*    No results found for this or any previous visit (from the past 240 hour(s)).      Scheduled Meds:  Scheduled Meds: . antiseptic oral rinse  7 mL Mouth Rinse QID  . aspirin  81 mg Per Tube Daily  . atorvastatin  80 mg Per Tube QPM  . chlorhexidine  15 mL Mouth Rinse BID  . clopidogrel  75 mg Per Tube Daily  . feeding supplement (PRO-STAT SUGAR FREE 64)  30 mL Per Tube TID  . heparin  5,000 Units Subcutaneous 3 times per day  . Influenza vac split quadrivalent PF  0.5 mL Intramuscular Tomorrow-1000  . levothyroxine  175 mcg Per Tube QAC breakfast  . metoprolol tartrate  12.5 mg Per Tube BID  . nitroGLYCERIN  0.5 inch Topical 4 times per day  . pantoprazole (PROTONIX) IV  40 mg Intravenous Q24H  . sodium chloride  10-40 mL Intracatheter Q12H    Time spent on care of this patient: 35 mins  Lala Lund K M.D on 10/18/2013 at 10:26 AM  Between 7am to 7pm - Pager - [phone], After 7pm go to [email or web address] - password [email or web address]  And look for the night coverage person covering me after hours  [place]  [phone]    10/18/2013, 10:26 AM   LOS: 11 days

## 2013-10-18 NOTE — Progress Notes (Signed)
Pt alert and oriented x4, denies any pain or discomfort at this time. Pt OOB to chair for 2 hours. RN assisted pt back to  Bed. Call bell within reach, bed in lowest position, will continue monitor.

## 2013-10-18 NOTE — Progress Notes (Signed)
IP PROGRESS NOTE  Subjective:   78 year old gentleman with a rather complex history and prolonged hospitalization. He was evaluated by Dr. Marin Olp for a right kidney mass and retroperitoneal adenopathy. He underwent a biopsy on 10/14/2013 which showed a clear cell renal cell carcinoma. Patient currently recovering from an acute stroke and had failed a swallowing study and currently receiving feeding via NG tube. I was asked to comment about his treatment options and prognosis moving forward.  Clinically, patient had been relatively weak but is getting stronger. He is unable to swallow but has participated in physical therapy. He is now reporting any pain or discomfort.  Objective:  Vital signs in last 24 hours: Temp:  [98.1 F (36.7 C)-99.4 F (37.4 C)] 98.5 F (36.9 C) (09/12 1005) Pulse Rate:  [65-85] 65 (09/12 1005) Resp:  [17-18] 18 (09/12 1005) BP: (140-164)/(62-68) 153/62 mmHg (09/12 1005) SpO2:  [96 %-97 %] 96 % (09/12 1005) Weight:  [156 lb 15.8 oz (71.21 kg)-157 lb (71.215 kg)] 156 lb 15.8 oz (71.21 kg) (09/12 0500) Weight change: 2 lb (0.907 kg) Last BM Date: 10/17/13  Intake/Output from previous day: 09/11 0701 - 09/12 0700 In: 60 [NG/GT:60] Out: 700 [Urine:700]  Mouth: mucous membranes moist, pharynx normal without lesions Resp: clear to auscultation bilaterally Cardio: regular rate and rhythm, S1, S2 normal, no murmur, click, rub or gallop GI: soft, non-tender; bowel sounds normal; no masses,  no organomegaly Extremities: extremities normal, atraumatic, no cyanosis or edema  Portacath/PICC-without erythema  Lab Results:  Recent Labs  10/16/13 0533  WBC 7.4  HGB 8.7*  HCT 28.4*  PLT 354    BMET  Recent Labs  10/16/13 0533  NA 139  K 4.2  CL 102  CO2 25  GLUCOSE 120*  BUN 23  CREATININE 0.73  CALCIUM 8.3*      Medications: I have reviewed the patient's current medications.  Assessment/Plan:   78 year old gentleman with multiple medical  issues including acute stroke and a recent finding of a right kidney mass and retroperitoneal adenopathy. This is biopsy proven to be clear cell carcinoma. The natural course of this disease was discussed with the patient and his wife today. Treatment options were also outlined.  Clearly he is not a surgical candidate and debulking nephrectomy is not an option. He is a marginal candidate for systemic therapy given his multiple comorbid conditions and the recent hospitalization. However, his condition could improve marginally down the line consideration for palliative treatment could be considered.  Overall he understands it is no cure for this cancer regardless. Eventually this cancer will have widespread metastasis and eventually will cause him morbidity and likely will die from this cancer in months rather than years.  All her questions were answered to their satisfaction. He will think about whether he was to proceed with a feeding tube or discontinue all treatments and enroll hospice.  Patient had been seen by Dr. Marin Olp in the past and outpatient followup can be set up with him upon his discharge.    LOS: 11 days   ZOXWRU,EAVWU 10/18/2013, 12:49 PM

## 2013-10-19 LAB — GLUCOSE, CAPILLARY
GLUCOSE-CAPILLARY: 137 mg/dL — AB (ref 70–99)
Glucose-Capillary: 120 mg/dL — ABNORMAL HIGH (ref 70–99)
Glucose-Capillary: 132 mg/dL — ABNORMAL HIGH (ref 70–99)
Glucose-Capillary: 121 mg/dL — ABNORMAL HIGH (ref 70–99)
Glucose-Capillary: 120 mg/dL — ABNORMAL HIGH (ref 70–99)

## 2013-10-19 MED ORDER — PHENOL 1.4 % MT LIQD
1.0000 | OROMUCOSAL | Status: DC | PRN
  Administered 2013-10-19 (×2): 1 via OROMUCOSAL
  Filled 2013-10-19: qty 177

## 2013-10-19 NOTE — Progress Notes (Signed)
[place] TEAM 1 - Stepdown/ICU TEAM Progress Note  Taylor Keller ASN:[id number] DOB: 1935-11-06 DOA: 10/07/2013 PCP: Odette Fraction, MD  Admit HPI / Brief Narrative:  78 year old male with cardiac arrest 2nd to MI in July 2015 (tx at outside hospital), and cerebellar hemorrhage 09/25/2013 briefly his aspirin Plavix were held and then resumed prior to nursing home discharge, he was subsequently discharged to inpatient rehab 8/25. While there, he developed fevers and lethargy. Medical staff there was concerned for sepsis, PCCM called to readmit on 10/07/2013.  Patient required brief intubation. He had some baseline dysphagia which got worse after extubation, he is currently n.p.o. with an NG tube and tube feeds. 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 Marin Olp along with Dr. Alen Blew, preliminary consensus is that his prognosis long-term is poor. Palliative care will also be involved.    He was kept initially pulmonary critical care service, transferred to my care on 10/16/2013 on day 9 of his hospital stay.       Significant Events:   8/25 - discharged from NICU to inpatient rehab  8/31 - CT head - No new intracranial hemorrhage 9/1 - readmitted to NICU for presumed septic shock, resp failure, fever 103 - intubated  9/2 - improved on pressors  9/2 EEG > generalized irregular slow activity  9/2 [place] abdo - Multiple gallstones. 4.2 cm right renal mass suspicious for renal cell carcinoma. Two large solid retroperitoneal mass lesions, the largest measures 9.6 cm in diameter. Metastatic disease, primary  retroperitoneal tumor, and lymphoma could present in this fashion.  9/3 - improved neurostatus - extubated  9/3 CT - 7.4 cm enhancing mass is seen involving the right kidney consistent with renal cell carcinoma. It may involve the renal vein in the hilum, but does not appear to extend  significantly centrally. 3.7 cm right adrenal mass is noted consistent with metastatic disease. Large multiloculated mass is noted in the right retroperitoneal region measuring 10 x 5.9 cm consistent with metastatic disease.  9/8 - CT guided core bx R retroperitoneal mass showing clear-cell renal cell carcinoma 9/5 - MRI brain noted    HPI/Subjective:  Pt much more alert and interactive today.  Denies any headache, no chest abdominal pain, generalized weakness. No shortness of breath.     Assessment/Plan:  R Renal mass - renal cell CA   confirmed with [place] and CT - likely mets to adrenal gland and retroperitoneum - Urology feels they have little to offer and that surgery is not indicated, seen by oncology and IR. - CT of the chest is negative for pulmonary metastases - IR completed CT guided bx 9/8 pathology showing clear-cell renal cell carcinoma, per oncology Long-term prognosis poor oncology appears bleak. Family considering hospice option and leaning more towards comfort. Palliative has been consulted, we await their input.    Dysphagia  Was on honey thick liquid after d/c from [place] hospital July 2015 - MBS 8/3 resulted in rec for D3 w/ thin w/ chin tuck - unfortunately f/u MBS 9/5 suggests NPO status only - placed feeding tub > tube feeds -SLP to cont to follow and re-study when indicated . Speech re consulted but he failed modified barium again on 10/17/2013, after discussions with palliative care it will be decided whether he wants PEG tube versus comfort feeds.   Septic shock - unclear etiology UA negative 8/31 but 21-50 WBC on  9/2 - urine culture negative - blood culture no growth - CXR not c/w pneumonia or aspiration - completed empiric abx for 7 days of tx     Stress cardiac ischemia - CAD s/p cardiac arrest July 2015 s/p DES x2 V fib cardiac arrest on 08/21/2013 in [place] requiring CPR for several minutes - cardiac cath on 08/25/2013 and received drug-eluting stent to LAD  and another drug-eluting stent to left circumflex artery - residual 50% stenosis in the RCA - patient was discharged on 09/05/2013 - troponin increased during septic episode, but has now normalized, w/ no s/sx to suggest active ischemia  - plavix resumed post bx on 9/9     Acute resp failure in setting of septic shock  Intubated 9/1 > extubated 9/3 - resp status stable      Acute encephalopathy  secondary to hypotension in setting carotid dz - mental status much improved, and gradually getting even better     Recent R cerebellar bleed with dysphagia Due to malignant HTN in setting of ASA/Plavix - stable at this time, he was on aspirin Plavix at rehabilitation prior to present hospital admission. I have noted MRI done on 10/11/2013. Discussed the MRI brain results with neurosurgeon on call Dr. Arnoldo Morale on 10/16/2013. Per Dr. Arnoldo Morale repeat MRI looks improved. No acute interventions or change is needed in treatment of medications.    Hypovolemia, hyponatremia , hypokalemia Resolved w/ IVF and supplementation   Anemia - chronic Hgb is stable - follow post bx w/ resumption of plavix    Coagulapathy Due to sepsis? - resolved    Hypothyroid On synthroid - TSH 4.173    Severe malnutrition in the context of acute illness Tube feeds initiated , failed modified barium swallow twice    Code Status: FULL Family Communication: Wife on daily basis Disposition Plan: We recommended SNF but family and patient resistant. If they refuse SNF then [facility] PT vs home Hospice.  Consultants: Urology Oncology [facility] Neurology IR Neurosurgery Dr. Arnoldo Morale over the phone on 10/16/2013. Palliative care  Antibiotics: Zosyn 9/01 ceftaz 9/02 >>9/07  DVT prophylaxis: SCDs  Objective: Blood pressure 149/67, pulse 80, temperature 98.1 F (36.7 C), temperature source Oral, resp. rate 16, height 6' (1.829 m), weight 71.53 kg (157 lb 11.1 oz), SpO2 97.00%.  Intake/Output Summary (Last 24 hours)  at 10/19/13 0951 Last data filed at 10/19/13 0943  Gross per 24 hour  Intake     90 ml  Output    900 ml  Net   -810 ml    Exam: General: No acute respiratory distress - more alert today  , still has NG Lungs: Clear to auscultation bilaterally without wheezes or crackles Cardiovascular: Regular rate and rhythm without murmur gallop or rub  Abdomen: Nontender, nondistended, soft, bowel sounds positive, no rebound, no ascites, no appreciable mass - puncture site R back w/o evidence of hematoma or bleeding  Extremities: No significant cyanosis, clubbing, or edema bilateral lower extremities  Data Reviewed: Basic Metabolic Panel:  Recent Labs Lab 10/13/13 0545 10/14/13 0445 10/15/13 0300 10/16/13 0533  NA 138 137 139 139  K 3.4* 3.4* 3.2* 4.2  CL 100 98 102 102  CO2 26 27 26 25   GLUCOSE 142* 113* 134* 120*  BUN 26* 26* 24* 23  CREATININE 0.88 0.81 0.80 0.73  CALCIUM 8.7 8.4 8.6 8.3*  MG  --   --  2.2  --    Liver Function Tests:  Recent Labs Lab 10/13/13 0545 10/15/13 0300  AST  15 13  ALT 13 9  ALKPHOS 63 56  BILITOT 0.3 0.4  PROT 6.9 6.7  ALBUMIN 2.1* 2.0*   Coags:  Recent Labs Lab 10/14/13 0445  INR 1.36   CBC:  Recent Labs Lab 10/13/13 0545 10/14/13 0445 10/15/13 0300 10/16/13 0533  WBC 7.9 7.3 7.5 7.4  NEUTROABS  --   --  5.8  --   HGB 9.5* 9.1* 9.1* 8.7*  HCT 31.1* 29.9* 29.5* 28.4*  MCV 87.1 85.9 85.8 86.6  PLT 320 325 346 354    Cardiac Enzymes:  Recent Labs Lab 10/13/13 0545  TROPONINI <0.30   CBG:  Recent Labs Lab 10/18/13 0437 10/18/13 2011 10/18/13 2354 10/19/13 0418 10/19/13 0753  GLUCAP 125* 126* 132* 137* 120*    No results found for this or any previous visit (from the past 240 hour(s)).     Scheduled Meds:  Scheduled Meds: . antiseptic oral rinse  7 mL Mouth Rinse QID  . aspirin  81 mg Per Tube Daily  . atorvastatin  80 mg Per Tube QPM  . chlorhexidine  15 mL Mouth Rinse BID  . clopidogrel  75 mg Per Tube  Daily  . feeding supplement (PRO-STAT SUGAR FREE 64)  30 mL Per Tube TID  . heparin  5,000 Units Subcutaneous 3 times per day  . levothyroxine  175 mcg Per Tube QAC breakfast  . metoprolol tartrate  12.5 mg Per Tube BID  . nitroGLYCERIN  0.5 inch Topical 4 times per day  . pantoprazole (PROTONIX) IV  40 mg Intravenous Q24H  . sodium chloride  10-40 mL Intracatheter Q12H    Time spent on care of this patient: 35 mins  Lala Lund K M.D on 10/19/2013 at 9:51 AM  Between 7am to 7pm - Pager - [phone], After 7pm go to [email or web address] - password [email or web address]  And look for the night coverage person covering me after hours  [place]  [phone]    10/19/2013, 9:51 AM   LOS: 12 days

## 2013-10-20 DIAGNOSIS — R1314 Dysphagia, pharyngoesophageal phase: Secondary | ICD-10-CM

## 2013-10-20 DIAGNOSIS — Z515 Encounter for palliative care: Secondary | ICD-10-CM

## 2013-10-20 LAB — GLUCOSE, CAPILLARY
GLUCOSE-CAPILLARY: 105 mg/dL — AB (ref 70–99)
GLUCOSE-CAPILLARY: 107 mg/dL — AB (ref 70–99)
Glucose-Capillary: 113 mg/dL — ABNORMAL HIGH (ref 70–99)
Glucose-Capillary: 119 mg/dL — ABNORMAL HIGH (ref 70–99)
Glucose-Capillary: 117 mg/dL — ABNORMAL HIGH (ref 70–99)

## 2013-10-20 MED ORDER — SORBITOL 70 % SOLN: 30.0000 mL | Freq: Every day | Status: DC | PRN

## 2013-10-20 MED ORDER — ONDANSETRON HCL 4 MG/2ML IJ SOLN
4.0000 mg | Freq: Four times a day (QID) | INTRAMUSCULAR | Status: DC | PRN
  Administered 2013-10-20 – 2013-10-21 (×3): 4 mg via INTRAVENOUS
  Filled 2013-10-20 (×3): qty 2

## 2013-10-20 MED ORDER — STARCH (THICKENING) PO POWD
ORAL | Status: DC | PRN
  Filled 2013-10-20: qty 227

## 2013-10-20 MED ORDER — MORPHINE SULFATE (CONCENTRATE) 10 MG /0.5 ML PO SOLN: 5.0000 mg | ORAL | Status: DC | PRN

## 2013-10-20 MED ORDER — LORAZEPAM 0.5 MG PO TABS: 0.5000 mg | ORAL_TABLET | Freq: Three times a day (TID) | ORAL | Status: DC | PRN

## 2013-10-20 MED ORDER — PANTOPRAZOLE SODIUM 40 MG PO PACK
40.0000 mg | PACK | Freq: Every day | ORAL | Status: DC
  Administered 2013-10-20: 40 mg
  Filled 2013-10-20 (×2): qty 20

## 2013-10-20 NOTE — Progress Notes (Signed)
Speech Language Pathology Treatment: Dysphagia  Patient Details Name: Taylor Keller MRN: [id number] DOB: 30-Jun-1935 Today's Date: 10/20/2013 Time: 8676-1950 SLP Time Calculation (min): 26 min  Assessment / Plan / Recommendation Clinical Impression  Pt was seen for f/u after [place] meeting this morning. Note that patient has since been initiated on a Dys 1 diet with pudding thick liquids. Would recommend to continue this diet as the least restrictive diet per MBS 9/11, with known high risk of aspiration. Would also recommend a strong cough/throat clear after each bite/sip, small sizes, and slow rate, as well as medications crushed in puree.  SLP reviewed results of MBS with patient and wife, as well as risks of aspiration. Pt independently verbalized risks and recommended strategies to use during intake to reduce the risks as best as can be done. Pt was tired and therefore PO trials were not administered this afternoon, but skilled interventions focused on education and training with wife and patient who were both engaged and asking questions. Encouraged them to have RN page this SLP [phone]) should they have any further questions prior to discharge home (planned for tomorrow per MD note this am). Will continue to follow and offer support as able.   HPI HPI: 78 year old male with recent cardiac arrest 2nd to MI and cerebellar hemorrhage. Transferred to CIR 8/25.  Developed sepsis and resp failure and readmitted to [facility] ICU on 9/1.  OETT 9/1-9/3; during this admission found to have right renal and peritoneal mass; followed by [facility] and awaiting biopsy results. Pt has hx of neurogenic dysphagia and has been followed by SLP services since CIR admission.  Has undergone multiple MBS studies, the last of which was 9/5 and revealed a deterioration in swallow function with severe pharyngeal dysphagia (silent aspiration and severe pharyngeal residue to due poor mobility hyolaryngeal complex). Pt is currently  NPO with enteral feedings.  Has been engaging in therapeutic exercise and repeat MBS recommended for today.     Pertinent Vitals Pain Assessment: No/denies pain Pain Intervention(s): Monitored during session  SLP Plan  Goals updated (due to changes in [place])    Recommendations Diet recommendations: Dysphagia 1 (puree);Pudding-thick liquid Liquids provided via: Teaspoon Medication Administration: Crushed with puree Supervision: Patient able to self feed;Full supervision/cueing for compensatory strategies Compensations: Slow rate;Small sips/bites;Clear throat after each swallow Postural Changes and/or Swallow Maneuvers: Seated upright 90 degrees;Upright 30-60 min after meal              Oral Care Recommendations: Oral care before and after PO Follow up Recommendations: Home health SLP Plan: Goals updated (due to changes in [place])    GO      Taylor Keller, M.A. CCC-SLP [phone]  Taylor Keller 10/20/2013, 4:57 PM

## 2013-10-20 NOTE — Consult Note (Signed)
Patient Taylor Keller      DOB: 05-Jul-1935      UYQ:[id number]     Consult Note from the Palliative Medicine Team at [facility] Requested by: Dr. Candiss Norse     PCP: Odette Fraction, MD Reason for Consultation: [place] and options    Phone Number:[phone]  Assessment of patients Current state: I met today with Taylor Keller and his wife, Manuela Schwartz, at bedside. 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 We discussed the unfortunate events that have transpired over the past few months beginning with MI and cardiac arrest in July followed by CVA and now sepsis and the finding of renal cell carcinoma. He now cannot safely swallow and has been recommended NPO diet by SLP. He tells me that he has decided that he does not want PEG and wants comfort feeds - we discussed that this leaves him at high risk aspiration and he understands. We discussed hospice and comfort options. Manuela Schwartz expresses that she believes he can recover some of his swallow function with continued work with SLP because he had progress with this in [place]. For this reason, Manuela Schwartz would like to continue PT/SLP to see if they can have any progress. Educated them on self referral to hospice and they seem to understand this may be indicated with further decline. I stressed my concern over aspiration and that this will be a limiting factor for him to progress into the near future. Will d/c NGT today and begin comfort feeds as discussed with Taylor Keller and family. Taylor Keller also tells me that he would not want CPR/shock/intubation if he arrests and Manuela Schwartz supports this decision - DNR ordered.    Goals of Care: 1.  Code Status: DNR   2. Scope of Treatment: No artificial feeding desired. Focus comfort but wishes to continue therapy at this point. DNR.    4. Disposition: Home with home health vs hospice.    3. Symptom Management:   1. Anxiety/Sleep: Lorazepam 0.5 mg TID prn.  2. Pain: Roxanol  5 mg every 3 hours prn.  3. Throat pain: Chloraseptic spray prn.  4. Bowel Regimen: Sorbitol 70% 30 mg daily prn.  5. Fever: Acetaminophen prn.  6. Nausea/Vomiting: Ondansetron prn.   4. Psychosocial: Emotional support provided to patient and family.   5. Spiritual: Support from personal pastor and church.    Patient Documents Completed or Given: Document Given Completed  Advanced Directives Pkt    MOST    DNR    Gone from My Sight    Hard Choices yes     Brief HPI: 78 yo male with cardiac arrest r/t MI July 2015 in [place] hospital. August 2015 he had cerebellar hemorrhage where he was admitted to CIR. While in CIR he developed fevers and lethargy and was readmitted with sepsis and was briefly intubated. 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    ROS: + pain right throat (no edema/swelling on exam, no thrush, feeding tube visualized on right), + sleep disturbance    PMH:  Past Medical History  Diagnosis Date  . Lumbar stenosis     L5  . Hypothyroidism   . Hyperlipidemia   . Hypertension   . ED (erectile dysfunction)   . Internal carotid artery stenosis 5/15    bilateral, 40-59%  . Myocardial infarction 08/2013  . Heart murmur   .  Stroke ~ 2011    "slight; no permanent damage"     PSH: Past Surgical History  Procedure Laterality Date  . Tonsillectomy and adenoidectomy    . Appendectomy    . Coronary angioplasty with stent placement  08/2013    "2"  . Cataract extraction, bilateral Bilateral ~ 2012   I have reviewed the [place] and SH and  If appropriate update it with new information. Allergies  Allergen Reactions  . Benazepril Cough   Scheduled Meds: . antiseptic oral rinse  7 mL Mouth Rinse QID  . aspirin  81 mg Per Tube Daily  . atorvastatin  80 mg Per Tube QPM  . chlorhexidine  15 mL Mouth Rinse BID  . clopidogrel  75 mg Per Tube Daily  . feeding supplement  (PRO-STAT SUGAR FREE 64)  30 mL Per Tube TID  . heparin  5,000 Units Subcutaneous 3 times per day  . levothyroxine  175 mcg Per Tube QAC breakfast  . metoprolol tartrate  12.5 mg Per Tube BID  . nitroGLYCERIN  0.5 inch Topical 4 times per day  . pantoprazole (PROTONIX) IV  40 mg Intravenous Q24H  . sodium chloride  10-40 mL Intracatheter Q12H   Continuous Infusions: . sodium chloride 10 mL/hr at 10/14/13 0833  . feeding supplement (JEVITY 1.2 CAL) 1,000 mL (10/20/13 0008)   PRN Meds:.acetaminophen (TYLENOL) oral liquid 160 mg/5 mL, fentaNYL, labetalol, nitroGLYCERIN, ondansetron (ZOFRAN) IV, phenol, sodium chloride    BP 151/71  Pulse 80  Temp(Src) 97.7 F (36.5 C) (Axillary)  Resp 18  Ht 6' (1.829 m)  Wt 71.3 kg (157 lb 3 oz)  BMI 21.31 kg/m2  SpO2 97%   PPS: 30%   Intake/Output Summary (Last 24 hours) at 10/20/13 1024 Last data filed at 10/20/13 0830  Gross per 24 hour  Intake 1159.66 ml  Output   1025 ml  Net 134.66 ml   LBM: 10/20/13                        Physical Exam:  General: NAD, frail, thin HEENT: Temporal muscle wasting, moist mucous membranes without thrush, no JVD Chest: No labored breathing, symmetric, room air CVS: RRR, S1 S2 Abdomen: Soft, NT, ND, +BS Ext: MAE, no edema, warm to touch Neuro: Awake, alert, oriented x 3, follows commands  Labs: CBC    Component Value Date/Time   WBC 7.4 10/16/2013 0533   RBC 3.28* 10/16/2013 0533   HGB 8.7* 10/16/2013 0533   HCT 28.4* 10/16/2013 0533   PLT 354 10/16/2013 0533   MCV 86.6 10/16/2013 0533   MCH 26.5 10/16/2013 0533   MCHC 30.6 10/16/2013 0533   RDW 18.1* 10/16/2013 0533   LYMPHSABS 0.9 10/15/2013 0300   MONOABS 0.7 10/15/2013 0300   EOSABS 0.0 10/15/2013 0300   BASOSABS 0.0 10/15/2013 0300    BMET    Component Value Date/Time   NA 139 10/16/2013 0533   K 4.2 10/16/2013 0533   CL 102 10/16/2013 0533   CO2 25 10/16/2013 0533   GLUCOSE 120* 10/16/2013 0533   BUN 23 10/16/2013 0533   CREATININE 0.73  10/16/2013 0533   CREATININE 1.12 09/19/2013 1524   CALCIUM 8.3* 10/16/2013 0533   GFRNONAA 88* 10/16/2013 0533   GFRNONAA 63 09/19/2013 1524   GFRAA >90 10/16/2013 0533   GFRAA 73 09/19/2013 1524    CMP     Component Value Date/Time   NA 139 10/16/2013 0533   K 4.2 10/16/2013  0533   CL 102 10/16/2013 0533   CO2 25 10/16/2013 0533   GLUCOSE 120* 10/16/2013 0533   BUN 23 10/16/2013 0533   CREATININE 0.73 10/16/2013 0533   CREATININE 1.12 09/19/2013 1524   CALCIUM 8.3* 10/16/2013 0533   PROT 6.7 10/15/2013 0300   ALBUMIN 2.0* 10/15/2013 0300   AST 13 10/15/2013 0300   ALT 9 10/15/2013 0300   ALKPHOS 56 10/15/2013 0300   BILITOT 0.4 10/15/2013 0300   GFRNONAA 88* 10/16/2013 0533   GFRNONAA 63 09/19/2013 1524   GFRAA >90 10/16/2013 0533   GFRAA 73 09/19/2013 1524     Time In Time Out Total Time Spent with Patient Total Overall Time  0930 1035 70mn 669m    Greater than 50%  of this time was spent counseling and coordinating care related to the above assessment and plan.  AlVinie SillNP Palliative Medicine Team Pager # [phone]-F 8a-5p) Team Phone # [phone]/Weekends)

## 2013-10-20 NOTE — Progress Notes (Signed)
[facility] TEAM 1 - Stepdown/ICU TEAM Progress Note  Taylor Keller:[id number] DOB: 1935/05/26 DOA: 10/07/2013 PCP: Odette Fraction, MD  Admit HPI / Brief Narrative:  78 year old male with cardiac arrest 2nd to MI in July 2015 (tx at outside hospital), and cerebellar hemorrhage 09/25/2013 briefly his aspirin Plavix were held and then resumed prior to nursing home discharge, he was subsequently discharged to inpatient rehab 8/25. While there, he developed fevers and lethargy. Medical staff there was concerned for sepsis, PCCM called to readmit on 10/07/2013.  Patient required brief intubation. He had some baseline dysphagia which got worse after extubation, he is currently n.p.o. with an NG tube and tube feeds. 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 Marin Olp along with Dr. Alen Blew, preliminary consensus is that his prognosis long-term is poor. Palliative care will also be involved.    He was kept initially pulmonary critical care service, transferred to my care on 10/16/2013 on day 9 of his hospital stay.       Significant Events:   8/25 - discharged from NICU to inpatient rehab  8/31 - CT head - No new intracranial hemorrhage 9/1 - readmitted to NICU for presumed septic shock, resp failure, fever 103 - intubated  9/2 - improved on pressors  9/2 EEG > generalized irregular slow activity  9/2 [place] abdo - Multiple gallstones. 4.2 cm right renal mass suspicious for renal cell carcinoma. Two large solid retroperitoneal mass lesions, the largest measures 9.6 cm in diameter. Metastatic disease, primary  retroperitoneal tumor, and lymphoma could present in this fashion.  9/3 - improved neurostatus - extubated  9/3 CT - 7.4 cm enhancing mass is seen involving the right kidney consistent with renal cell carcinoma. It may involve the renal vein in the hilum, but does not appear to extend  significantly centrally. 3.7 cm right adrenal mass is noted consistent with metastatic disease. Large multiloculated mass is noted in the right retroperitoneal region measuring 10 x 5.9 cm consistent with metastatic disease.  9/8 - CT guided core bx R retroperitoneal mass showing clear-cell renal cell carcinoma 9/5 - MRI brain noted    HPI/Subjective:  Pt much more alert and interactive today.  Denies any headache, no chest abdominal pain, generalized weakness. No shortness of breath. Has decided against PEG tube.     Assessment/Plan:  R Renal mass - renal cell CA   confirmed with [place] and CT - likely mets to adrenal gland and retroperitoneum - Urology feels they have little to offer and that surgery is not indicated, seen by oncology and IR. - CT of the chest is negative for pulmonary metastases - IR completed CT guided bx 9/8 pathology showing clear-cell renal cell carcinoma, per oncology Long-term prognosis poor oncology appears bleak. Family considering hospice option and leaning more towards comfort. Palliative has been consulted, we await their input. Likely home hospice tomorrow.    Dysphagia  Was on honey thick liquid after d/c from [place] hospital July 2015 - MBS 8/3 resulted in rec for D3 w/ thin w/ chin tuck - unfortunately f/u MBS 9/5 suggests NPO status only - placed feeding tub > tube feeds -SLP to cont to follow and re-study when indicated . Speech re consulted but he failed modified barium again on 10/17/2013, patient currently deciding against PEG tube. Palliative care to me the patient and family today then most likely we will remove NG  tube and go with comfort feeds.    Septic shock - unclear etiology UA negative 8/31 but 21-50 WBC on 9/2 - urine culture negative - blood culture no growth - CXR not c/w pneumonia or aspiration - completed empiric abx for 7 days of tx     Stress cardiac ischemia - CAD s/p cardiac arrest July 2015 s/p DES x2 V fib cardiac arrest on  08/21/2013 in [place] requiring CPR for several minutes - cardiac cath on 08/25/2013 and received drug-eluting stent to LAD and another drug-eluting stent to left circumflex artery - residual 50% stenosis in the RCA - patient was discharged on 09/05/2013 - troponin increased during septic episode, but has now normalized, w/ no s/sx to suggest active ischemia  - plavix resumed post bx on 9/9     Acute resp failure in setting of septic shock  Intubated 9/1 > extubated 9/3 - resp status stable      Acute encephalopathy  secondary to hypotension in setting carotid dz - mental status much improved, and gradually getting even better     Recent R cerebellar bleed with dysphagia Due to malignant HTN in setting of ASA/Plavix - stable at this time, he was on aspirin Plavix at rehabilitation prior to present hospital admission. I have noted MRI done on 10/11/2013. Discussed the MRI brain results with neurosurgeon on call Dr. Arnoldo Morale on 10/16/2013. Per Dr. Arnoldo Morale repeat MRI looks improved. No acute interventions or change is needed in treatment of medications.    Hypovolemia, hyponatremia , hypokalemia Resolved w/ IVF and supplementation   Anemia - chronic Hgb is stable - follow post bx w/ resumption of plavix    Coagulapathy Due to sepsis? - resolved    Hypothyroid On synthroid - TSH 4.173    Severe malnutrition in the context of acute illness Tube feeds initiated , failed modified barium swallow twice    Code Status: FULL Family Communication: Wife on daily basis Disposition Plan: Likely home hospice  Consultants: Urology Oncology [facility] IR Neurosurgery Dr. Arnoldo Morale over the phone on 10/16/2013. Palliative care  Antibiotics: Zosyn 9/01 ceftaz 9/02 >>9/07  DVT prophylaxis: SCDs  Objective: Blood pressure 151/71, pulse 80, temperature 97.7 F (36.5 C), temperature source Axillary, resp. rate 18, height 6' (1.829 m), weight 71.3 kg (157 lb 3 oz), SpO2  97.00%.  Intake/Output Summary (Last 24 hours) at 10/20/13 1012 Last data filed at 10/20/13 0830  Gross per 24 hour  Intake 1159.66 ml  Output   1025 ml  Net 134.66 ml    Exam: General: No acute respiratory distress - more alert today  , still has NG Lungs: Clear to auscultation bilaterally without wheezes or crackles Cardiovascular: Regular rate and rhythm without murmur gallop or rub  Abdomen: Nontender, nondistended, soft, bowel sounds positive, no rebound, no ascites, no appreciable mass - puncture site R back w/o evidence of hematoma or bleeding  Extremities: No significant cyanosis, clubbing, or edema bilateral lower extremities  Data Reviewed: Basic Metabolic Panel:  Recent Labs Lab 10/14/13 0445 10/15/13 0300 10/16/13 0533  NA 137 139 139  K 3.4* 3.2* 4.2  CL 98 102 102  CO2 27 26 25   GLUCOSE 113* 134* 120*  BUN 26* 24* 23  CREATININE 0.81 0.80 0.73  CALCIUM 8.4 8.6 8.3*  MG  --  2.2  --    Liver Function Tests:  Recent Labs Lab 10/15/13 0300  AST 13  ALT 9  ALKPHOS 56  BILITOT 0.4  PROT 6.7  ALBUMIN 2.0*  Coags:  Recent Labs Lab 10/14/13 0445  INR 1.36   CBC:  Recent Labs Lab 10/14/13 0445 10/15/13 0300 10/16/13 0533  WBC 7.3 7.5 7.4  NEUTROABS  --  5.8  --   HGB 9.1* 9.1* 8.7*  HCT 29.9* 29.5* 28.4*  MCV 85.9 85.8 86.6  PLT 325 346 354    Cardiac Enzymes: No results found for this basename: CKTOTAL, CKMB, CKMBINDEX, TROPONINI,  in the last 168 hours CBG:  Recent Labs Lab 10/19/13 1724 10/19/13 2013 10/19/13 2354 10/20/13 0433 10/20/13 0756  GLUCAP 121* 120* 107* 113* 119*    No results found for this or any previous visit (from the past 240 hour(s)).     Scheduled Meds:  Scheduled Meds: . antiseptic oral rinse  7 mL Mouth Rinse QID  . aspirin  81 mg Per Tube Daily  . atorvastatin  80 mg Per Tube QPM  . chlorhexidine  15 mL Mouth Rinse BID  . clopidogrel  75 mg Per Tube Daily  . feeding supplement (PRO-STAT SUGAR  FREE 64)  30 mL Per Tube TID  . heparin  5,000 Units Subcutaneous 3 times per day  . levothyroxine  175 mcg Per Tube QAC breakfast  . metoprolol tartrate  12.5 mg Per Tube BID  . nitroGLYCERIN  0.5 inch Topical 4 times per day  . pantoprazole (PROTONIX) IV  40 mg Intravenous Q24H  . sodium chloride  10-40 mL Intracatheter Q12H    Time spent on care of this patient: 35 mins  Lala Lund K M.D on 10/20/2013 at 10:12 AM  Between 7am to 7pm - Pager - [phone], After 7pm go to [email or web address] - password [email or web address]  And look for the night coverage person covering me after hours  [place]  [phone]    10/20/2013, 10:12 AM   LOS: 13 days

## 2013-10-20 NOTE — Progress Notes (Signed)
NG tube removed, pt tolerated procedure well. Tube feeding d/c'd. Pt complaining of N/V, requesting to hold off on oral care and medications prior to removal. Speech to eval later today, will attempt meds as recommended. Will continue to monitor.

## 2013-10-20 NOTE — Progress Notes (Signed)
PT Cancellation Note  Patient Details Name: Taylor Keller MRN: [id number] DOB: December 13, 1935   Cancelled Treatment:   Pt was in a meeting when PT attempted to see x 2 and was unable to get visit done.  Will reschedule for visit later today or tomorrow.     Ramond Dial 10/20/2013, 10:30 AM

## 2013-10-20 NOTE — Progress Notes (Signed)
Physical Therapy Treatment Patient Details Name: Taylor Keller MRN: [id number] DOB: 1936-02-03 Today's Date: 10/20/2013    History of Present Illness 78 yo wm brought in via [facility] for severe HA, N/V, & dizziness. Per pt the HA began around 2130 & cont to get worse. Pt with slight RUE pronation & drift, Rt facial droop, nystagmus, RLE sensory deficit. Imaging reveals cerebellar hemorrhage. Renal Biopsy performed on 10/14/13..  Pt. for repeat MBS on 10/17/13    PT Comments    Continuing to work on pt and wife education, with goal of increasing safety and confidence with dc home; some nausea limiting activity tolerance today, but we were still able to cover transfers, gait, and wheelchair management  PT's best recommendation continues to be SNF, however, noted pt is refusing, and pt and wife choose to go home with Hospice care; Worth considering ambulance transport, or +2 assist to "bump" up steps to enter home   Follow Up Recommendations  SNF;Supervision/Assistance - 24 hour;Supervision for mobility/OOB HHPT, [facility] bed;Wheelchair (measurements PT);Wheelchair cushion (measurements PT)    Recommendations for Other Services       Precautions / Restrictions Precautions Precautions: Fall Precaution Comments: dizziness and nausea with movements Restrictions Other Position/Activity Restrictions: tolerates slow movements best    Mobility  Bed Mobility Overal bed mobility: Needs Assistance Bed Mobility: Supine to Sit Rolling: Min assist Sidelying to sit: Mod assist       General bed mobility comments: Got up without rails to more approximate home; mod assist with support at pelvic girdle and shoulder girdle  Transfers Overall transfer level: Needs assistance Equipment used: Rolling walker (2 wheeled) Transfers: Sit to/from Stand Sit to Stand: Min assist;Min guard         General transfer comment: Wife provided correct assist with sit  to stand; Cues for hand placement; stood from bed and wheelchair; needs cueing in particular for controlling descent with stand to sit  Ambulation/Gait Ambulation/Gait assistance: Mod assist;+2 physical assistance Ambulation Distance (Feet): 30 Feet (x2) Assistive device: Rolling walker (2 wheeled) Gait Pattern/deviations: Step-through pattern;Shuffle;Staggering right;Staggering left;Drifts right/left;Trunk flexed Gait velocity: decreased   General Gait Details: Pt. able to start out walk at min assist level but as he fatigues, he needs mod assist to maintain safe upright position.  Needed multiple brief pauses with each walk and required one seated rest.  Pt. is highly motivated to make it to the end of the hallway and seems to have a poor perception of his body fatiguing.  Continuing to educate wife on this issue.     Stairs Stairs:  (Discussed having +2 asist to 'bump' pt up or non-emergent ambulance transport)          Information systems manager mobility: Yes Wheelchair propulsion: Both upper extremities;Both lower extermities Wheelchair parts: Needs assistance Distance: 12 (including turns and managing in the room) Wheelchair Assistance Details (indicate cue type and reason): Cues for technique and setting the brakes and elevating legrests; Spent a lot of time educating pt and wife on sitting posture in wheelchair  Modified Rankin (Stroke Patients Only) Modified Rankin (Stroke Patients Only) Pre-Morbid Rankin Score: No significant disability Modified Rankin: Moderately severe disability     Balance Overall balance assessment: Needs assistance Sitting-balance support: Bilateral upper extremity supported;Feet supported Sitting balance-Leahy Scale: Fair     Standing balance support: Bilateral upper extremity supported Standing balance-Leahy Scale: Poor  Cognition Arousal/Alertness: Awake/alert Behavior During Therapy:  WFL for tasks assessed/performed;Impulsive           Following Commands: Follows one step commands with increased time            Exercises      General Comments        Pertinent Vitals/Pain Pain Assessment: No/denies pain Pain Intervention(s): Monitored during session    Home Living                      Prior Function            PT Goals (current goals can now be found in the care plan section) Acute Rehab PT Goals Patient Stated Goal: to walk 150'. to be able to take care of myself PT Goal Formulation: With patient Time For Goal Achievement: 10/24/13 Potential to Achieve Goals: Good Progress towards PT goals: Progressing toward goals    Frequency  Min 3X/week    PT Plan Current plan remains appropriate    Co-evaluation             End of Session Equipment Utilized During Treatment: Gait belt Activity Tolerance: Patient limited by fatigue Patient left: in chair;with call bell/phone within reach;with chair alarm set;with family/visitor present     Time: 2683-4196 PT Time Calculation (min): 54 min  Charges:  $Gait Training: 23-37 mins $Therapeutic Activity: 8-22 mins $Self Care/Home Management: 8-22                    G Codes:      Quin Hoop 10/20/2013, 3:45 PM  Roney [place], [place] Pager [phone] Office [phone]

## 2013-10-21 MED ORDER — ONDANSETRON HCL 4 MG PO TABS: 4.0000 mg | ORAL_TABLET | ORAL | Status: DC | PRN

## 2013-10-21 MED ORDER — PROMETHAZINE HCL 25 MG RE SUPP: 25.0000 mg | Freq: Four times a day (QID) | RECTAL | Status: DC | PRN

## 2013-10-21 MED ORDER — LORAZEPAM 0.5 MG PO TABS: 0.5000 mg | ORAL_TABLET | Freq: Three times a day (TID) | ORAL | Status: AC | PRN

## 2013-10-21 MED ORDER — MORPHINE SULFATE (CONCENTRATE) 10 MG /0.5 ML PO SOLN: 5.0000 mg | ORAL | Status: AC | PRN

## 2013-10-21 MED ORDER — FOOD THICKENER (THICKENUP CLEAR): ORAL | Status: AC

## 2013-10-21 MED ORDER — SORBITOL 70 % SOLN: 30.0000 mL | Freq: Every day | Status: DC | PRN

## 2013-10-21 MED ORDER — HEPARIN SODIUM (PORCINE) 5000 UNIT/ML IJ SOLN: 5000.0000 [IU] | Freq: Three times a day (TID) | INTRAMUSCULAR | Status: DC

## 2013-10-21 MED ORDER — CETYLPYRIDINIUM CHLORIDE 0.05 % MT LIQD: 7.0000 mL | Freq: Four times a day (QID) | OROMUCOSAL | Status: AC

## 2013-10-21 MED ORDER — BOOST HIGH PROTEIN PO LIQD: 1.0000 | Freq: Three times a day (TID) | ORAL | Status: AC

## 2013-10-21 NOTE — Discharge Instructions (Signed)
° ° ° °  Taylor Keller DOB [birth year]  was admitted to the Hospital on 10/07/2013 and Discharged  10/21/2013 and he and his wife Taylor Keller DOB 10/27/49 should be excused from any travel obligations for the next 2 months as he is extremely sick  Call Lala Lund MD, [facility] [phone] with questions.  Thurnell Lose M.D on 10/21/2013,at 8:08 AM  [facility]   Office  [phone]  Follow with Primary MD Jenna Luo TOM, MD in 3 days   Get CBC, CMP, 2 view Chest X ray checked  by Primary MD next visit.    Activity: As tolerated with Full fall precautions use walker/cane & assistance as needed   Disposition Home     Diet: Dysphagia on with pudding thick liquids with feeding assistance and aspiration precautions as needed.  For Heart failure patients - Check your Weight same time everyday, if you gain over 2 pounds, or you develop in leg swelling, experience more shortness of breath or chest pain, call your Primary MD immediately. Follow Cardiac Low Salt Diet and 1.8 lit/day fluid restriction.   On your next visit with her primary care physician please Get Medicines reviewed and adjusted.  Please request your Prim.MD to go over all Hospital Tests and Procedure/Radiological results at the follow up, please get all Hospital records sent to your Prim MD by signing hospital release before you go home.   If you experience worsening of your admission symptoms, develop shortness of breath, life threatening emergency, suicidal or homicidal thoughts you must seek medical attention immediately by calling 911 or calling your MD immediately  if symptoms less severe.  You Must read complete instructions/literature along with all the possible adverse reactions/side effects for all the Medicines you take and that have been prescribed to you. Take any new Medicines after you have completely understood and accpet all the  possible adverse reactions/side effects.   Do not drive, operating heavy machinery, perform activities at heights, swimming or participation in water activities or provide baby sitting services if your were admitted for syncope or siezures until you have seen by Primary MD or a Neurologist and advised to do so again.  Do not drive when taking Pain medications.    Do not take more than prescribed Pain, Sleep and Anxiety Medications  Special Instructions: If you have smoked or chewed Tobacco  in the last 2 yrs please stop smoking, stop any regular Alcohol  and or any Recreational drug use.  Wear Seat belts while driving.   Please note  You were cared for by a hospitalist during your hospital stay. If you have any questions about your discharge medications or the care you received while you were in the hospital after you are discharged, you can call the unit and asked to speak with the hospitalist on call if the hospitalist that took care of you is not available. Once you are discharged, your primary care physician will handle any further medical issues. Please note that NO REFILLS for any discharge medications will be authorized once you are discharged, as it is imperative that you return to your primary care physician (or establish a relationship with a primary care physician if you do not have one) for your aftercare needs so that they can reassess your need for medications and monitor your lab values.

## 2013-10-21 NOTE — Progress Notes (Signed)
PT Cancellation Note  Patient Details Name: Taylor Keller MRN: [id number] DOB: Nov 14, 1935   Cancelled Treatment:    Reason Eval/Treat Not Completed: Patient declined, no reason specified  Pt politely refused therapy today as he is scheduled to discharge within 2 hours and wants to save his energy for that. Answered all questions by patient and wife. Will follow up if pt still in hospital on 9/16.   Candy Sledge A 10/21/2013, 3:10 PM Candy Sledge, [place], DPT [phone]

## 2013-10-21 NOTE — Progress Notes (Signed)
Note/chart reviewed.  Katie Janiqua Friscia, RD, LDN Pager #: [phone] After-Hours Pager #: [phone]  

## 2013-10-21 NOTE — Discharge Summary (Addendum)
Taylor Keller, is a 78 y.o. male  DOB 1935-09-15  MRN [id number].  Admission date:  10/07/2013  Admitting Physician  Raylene Miyamoto, MD  Discharge Date:  10/21/2013   Primary MD  Odette Fraction, MD  Recommendations for primary care physician for things to follow:   Check CBC, CMP 2 view chest x-ray next visit. Goal of care directed towards comfort. If patient and family desire they can follow with oncology, neurology and cardiology in the outpatient setting.   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 Lumbar stenosis     L5  . Hypothyroidism   . Hyperlipidemia   . Hypertension   . ED (erectile dysfunction)   . Internal carotid artery stenosis 5/15    bilateral, 40-59%  . Myocardial infarction 08/2013  . Heart murmur   . Stroke ~ 2011    "slight; no permanent damage"    Past Surgical History  Procedure Laterality Date  . Tonsillectomy and adenoidectomy    . Appendectomy    . Coronary angioplasty with stent placement  08/2013    "2"  . Cataract extraction, bilateral Bilateral ~ 2012       History of present illness and  Hospital Course:     Kindly see H&P for history of present illness and admission details, please review complete Labs, Consult reports and Test reports for all details in brief  HPI    78 year old male with cardiac arrest 2nd to MI in July 2015 (tx at outside hospital), and cerebellar hemorrhage 09/25/2013 briefly his aspirin Plavix were held and then resumed prior to  nursing home discharge, he was subsequently discharged to inpatient rehab 8/25. While there, he developed fevers and lethargy. Medical staff there was concerned for sepsis, PCCM called to readmit on 10/07/2013. Patient required brief intubation. He had some baseline dysphagia which got worse after extubation, he is currently n.p.o. with an NG tube and tube feeds. 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 Marin Olp along with Dr. Alen Blew, preliminary consensus is that his prognosis long-term is poor. Palliative care will also be involved.  He was kept initially pulmonary critical care service, transferred to my care on 10/16/2013 on day 9 of his hospital stay.       Hospital Course   R Renal mass - renal cell CA  confirmed with [place] and CT - likely mets to adrenal gland and retroperitoneum - Urology feels they have little to offer and that surgery is not indicated, seen by oncology and IR. - CT of the chest is negative for pulmonary  metastases - IR completed CT guided bx 9/8 pathology showing clear-cell renal cell carcinoma, per oncology Dr Alen Blew Long-term prognosis poor oncology appears bleak, they decided against the wall radiation or surgery etc. this time.   Palliative was consulted, patient and family considered hospice but for now want to go with home health, they want to transition to hospice if he declines which I think will happen very soon. They have refused placement. Goal of care is comfort.    Dysphagia  Was on honey thick liquid after d/c from [place] hospital July 2015 - MBS 8/3 resulted in rec for D3 w/ thin w/ chin tuck - unfortunately f/u MBS 9/5 suggests NPO status only - placed feeding tub > tube feeds -SLP to cont to follow and re-study when indicated . Speech re consulted but he failed modified barium again on 10/17/2013, patient currently deciding against PEG tube.    Palliative care to me the patient and family and he decided to discontinue NG tube, and go with comfort feeds with the full knowledge that he is very high risk for aspiration causing pneumonia or death. They want to take him home with home health and transition to hospice if he declines. They have refused placement. I had strongly suggested placement I think is too frail to go home .     Septic shock - unclear etiology  UA negative 8/31 but 21-50 WBC on 9/2 - urine culture negative - blood culture no growth - CXR not c/w pneumonia or aspiration - completed empiric abx for 7 days of tx     Stress cardiac ischemia - CAD s/p cardiac arrest July 2015 s/p DES x2  V fib cardiac arrest on 08/21/2013 in [place] requiring CPR for several minutes - cardiac cath on 08/25/2013 and received drug-eluting stent to LAD and another drug-eluting stent to left circumflex artery - residual 50% stenosis in the RCA - patient was discharged on 09/05/2013 - troponin increased during septic episode, but has now normalized, w/ no s/sx to suggest active ischemia - ASA - plavix resumed post bx on 9/9     Acute resp failure in setting of septic shock  Intubated 9/1 > extubated 9/3 - resp status stable    Acute encephalopathy  secondary to hypotension in setting carotid dz - mental status much improved and at baseline   Recent R cerebellar bleed with dysphagia  Due to malignant HTN in setting of ASA/Plavix - stable at this time, he was on aspirin Plavix at rehabilitation prior to present hospital admission. I have noted MRI done on 10/11/2013. Discussed the MRI brain results with neurosurgeon on call Dr. Arnoldo Morale on 10/16/2013. Per Dr. Arnoldo Morale repeat MRI looks improved. No acute interventions or change is needed in treatment of medications.     Hypovolemia, hyponatremia , hypokalemia  Resolved w/ IVF and supplementation     Anemia - chronic  Hgb is stable - follow post bx w/ resumption of plavix      Coagulapathy  Due to sepsis? - resolved    Hypothyroid  On synthroid - TSH 4.173    Severe malnutrition in the context of acute illness  NG tube now removed, on comfort feeds, will order protein supplementation.      Discharge Condition: Stable with extremely poor long-term prognosis, family for now wants to take him home with home health, they want to transition to hospice soon. They have refused placement.   Follow UP  Follow-up Information   Follow up with Xu,Jindong, MD In  2 months. (stroke clinic)    Specialty:  Neurology   Contact information:   [address] [place] [address] [phone]       Follow up with [facility]. [facility] RN, aide, Physical Therapy, Speech Therapy)    Specialty:  Home Health Services   Contact information:   [place] [address] [phone]       Follow up with [facility] TOM, MD. Schedule an appointment as soon as possible for a visit in 3 days.   Specialty:  Family Medicine   Contact information:   [address] [place] [address] [phone]         Discharge Instructions  and  Discharge Medications          Discharge Instructions   Discharge instructions    Complete by:  As directed   Taylor Keller DOB 2035-05-29  was admitted to the Hospital on 10/07/2013 and Discharged  10/21/2013 and he and his wife Mrs Arrie Zuercher DOB 10/27/49 should be excused from any travel obligations for the next 2 months as he is extremely sick  Call Lala Lund MD, [facility] [phone] with questions.  Thurnell Lose M.D on 10/21/2013,at 8:08 AM  [facility]   Office  [phone]  Follow with Primary MD Jenna Luo TOM, MD in 3 days   Get CBC, CMP, 2 view Chest X ray checked  by Primary MD next visit.    Activity: As tolerated with Full fall precautions use walker/cane & assistance as needed   Disposition Home     Diet:  Dysphagia on with pudding thick liquids with feeding assistance and aspiration precautions as needed.  For Heart failure patients - Check your Weight same time everyday, if you gain over 2 pounds, or you develop in leg swelling, experience more shortness of breath or chest pain, call your Primary MD immediately. Follow Cardiac Low Salt Diet and 1.8 lit/day fluid restriction.   On your next visit with her primary care physician please Get Medicines reviewed and adjusted.  Please request your Prim.MD to go over all Hospital Tests and Procedure/Radiological results at the follow up, please get all Hospital records sent to your Prim MD by signing hospital release before you go home.   If you experience worsening of your admission symptoms, develop shortness of breath, life threatening emergency, suicidal or homicidal thoughts you must seek medical attention immediately by calling 911 or calling your MD immediately  if symptoms less severe.  You Must read complete instructions/literature along with all the possible adverse reactions/side effects for all the Medicines you take and that have been prescribed to you. Take any new Medicines after you have completely understood and accpet all the possible adverse reactions/side effects.   Do not drive, operating heavy machinery, perform activities at heights, swimming or participation in water activities or provide baby sitting services if your were admitted for syncope or siezures until you have seen by Primary MD or a Neurologist and advised to do so again.  Do not drive when taking Pain medications.    Do not take more than prescribed Pain, Sleep and Anxiety Medications  Special Instructions: If you have smoked or chewed Tobacco  in the last 2 yrs please stop smoking, stop any regular Alcohol  and or any Recreational drug use.  Wear Seat belts while driving.   Please note  You were cared for by a hospitalist during your hospital stay. If you have  any  questions about your discharge medications or the care you received while you were in the hospital after you are discharged, you can call the unit and asked to speak with the hospitalist on call if the hospitalist that took care of you is not available. Once you are discharged, your primary care physician will handle any further medical issues. Please note that NO REFILLS for any discharge medications will be authorized once you are discharged, as it is imperative that you return to your primary care physician (or establish a relationship with a primary care physician if you do not have one) for your aftercare needs so that they can reassess your need for medications and monitor your lab values.     Increase activity slowly    Complete by:  As directed             Medication List    STOP taking these medications       atorvastatin 80 MG tablet  Commonly known as:  LIPITOR     furosemide 40 MG tablet  Commonly known as:  LASIX     losartan 25 MG tablet  Commonly known as:  COZAAR      TAKE these medications       acetaminophen 325 MG tablet  Commonly known as:  TYLENOL  Take 2 tablets (650 mg total) by mouth every 4 (four) hours as needed for headache or mild pain.     albuterol (5 MG/ML) 0.5% nebulizer solution  Commonly known as:  PROVENTIL  Take 2.5 mg by nebulization every 6 (six) hours as needed for wheezing or shortness of breath.     antiseptic oral rinse 0.05 % Liqd solution  Commonly known as:  CPC / CETYLPYRIDINIUM CHLORIDE 0.05%  7 mLs by Mouth Rinse route QID.     aspirin 81 MG chewable tablet  Chew 81 mg by mouth daily.     clopidogrel 75 MG tablet  Commonly known as:  PLAVIX  Take 75 mg by mouth daily.     diphenhydrAMINE 25 MG tablet  Commonly known as:  BENADRYL  Take 25 mg by mouth at bedtime as needed for sleep.     feeding supplement Liqd  Take 1 Container by mouth 3 (three) times daily between meals.     food thickener Powd  Commonly known as:   RESOURCE THICKENUP CLEAR  Used to make liquids pudding thick     ipratropium 0.02 % nebulizer solution  Commonly known as:  ATROVENT  Take 0.5 mg by nebulization every 6 (six) hours as needed for wheezing or shortness of breath.     isosorbide mononitrate 60 MG 24 hr tablet  Commonly known as:  IMDUR  Take 60 mg by mouth daily.     lansoprazole 30 MG disintegrating tablet  Commonly known as:  PREVACID SOLUTAB  Take 30 mg by mouth daily.     levothyroxine 175 MCG tablet  Commonly known as:  SYNTHROID, LEVOTHROID  Take 175 mcg by mouth daily.     LORazepam 0.5 MG tablet  Commonly known as:  ATIVAN  Take 1 tablet (0.5 mg total) by mouth 3 (three) times daily as needed for anxiety or sleep.     metoprolol 50 MG tablet  Commonly known as:  LOPRESSOR  Take 50 mg by mouth 2 (two) times daily.     morphine CONCENTRATE 10 mg / 0.5 ml concentrated solution  Take 0.25 mLs (5 mg total) by mouth every 3 (three) hours as needed for  moderate pain or shortness of breath.     nitroGLYCERIN 0.4 MG SL tablet  Commonly known as:  NITROSTAT  Place 1 tablet (0.4 mg total) under the tongue every 5 (five) minutes as needed for chest pain.     ondansetron 4 MG tablet  Commonly known as:  ZOFRAN  Take 1 tablet (4 mg total) by mouth every 4 (four) hours as needed for nausea or vomiting.     promethazine 25 MG suppository  Commonly known as:  PHENERGAN  Place 1 suppository (25 mg total) rectally every 6 (six) hours as needed for nausea or vomiting.     sorbitol 70 % Soln  Take 30 mLs by mouth daily as needed for moderate constipation.          Diet and Activity recommendation: See Discharge Instructions above   Consults obtained -    Urology  Oncology  [facility]  Neurology  IR  Neurosurgery Dr. Arnoldo Morale over the phone on 10/16/2013.  Palliative care   Major procedures and Radiology Reports - PLEASE review detailed and final reports for all details, in brief -      8/25 - discharged  from NICU to inpatient rehab  8/31 - CT head - No new intracranial hemorrhage  9/1 - readmitted to NICU for presumed septic shock, resp failure, fever 103 - intubated  9/2 - improved on pressors  9/2 EEG > generalized irregular slow activity  9/2 [place] abdo - Multiple gallstones. 4.2 cm right renal mass suspicious for renal cell carcinoma. Two large solid retroperitoneal mass lesions, the largest measures 9.6 cm in diameter. Metastatic disease, primary  retroperitoneal tumor, and lymphoma could present in this fashion.  9/3 - improved neurostatus - extubated  9/3 CT - 7.4 cm enhancing mass is seen involving the right kidney consistent with renal cell carcinoma. It may involve the renal vein in the hilum, but does not appear to extend significantly centrally. 3.7 cm right adrenal mass is noted consistent with metastatic disease. Large multiloculated mass is noted in the right retroperitoneal region measuring 10 x 5.9 cm consistent with metastatic disease.  9/8 - CT guided core bx R retroperitoneal mass showing clear-cell renal cell carcinoma  9/5 - MRI brain noted    Dg Chest 2 View  10/06/2013   CLINICAL DATA:  Cough  EXAM: CHEST  2 VIEW  COMPARISON:  09/08/2013  FINDINGS: Cardiac shadow is stable. Previously seen basilar changes have resolved in the interval. No the significant residual infiltrate or effusion is noted. Degenerative changes of the thoracic spine are again seen.  IMPRESSION: Interval clearing of bibasilar changes.   Electronically Signed   By: Inez Catalina M.D.   On: 10/06/2013 09:48   Ct Head Wo Contrast  10/06/2013   CLINICAL DATA:  Followup bleed.  Lethargy.  EXAM: CT HEAD WITHOUT CONTRAST  TECHNIQUE: Contiguous axial images were obtained from the base of the skull through the vertex without intravenous contrast.  COMPARISON:  09/28/2013.  FINDINGS: The right cerebellar hemispheric infarct measures slightly smaller, now 3.5 cm x 2.4 cm transversely, previously 3.7 cm x 2.8 cm.  Surrounding vasogenic edema and mass effect are similar with a similar degree of shift of the posterior fossa midline to the left. Fourth ventricular hemorrhage noted on the prior study is not evident currently. There is a small amount of dependent hemorrhage in the occipital horn of the left lateral ventricle, with the lateral ventricular hemorrhage also decreased. Compression of the lower fourth ventricle is similar the  prior study. The lateral ventricles are normal in configuration. There is ventricular and sulcal enlargement reflecting atrophy. No evidence of hydrocephalus.  There is no new intracranial hemorrhage. There is no evidence of an ischemic infarct.  Visualized sinuses and mastoid air cells are clear.  IMPRESSION: 1. No new intracranial hemorrhage. No evidence of an ischemic infarct. No evidence of hydrocephalus. 2. There has been evolution of the right cerebellar infarct, which measures smaller and is less dense, although the overall mass-effect is similar. There is significantly less intraventricular hemorrhage.   Electronically Signed   By: Lajean Manes M.D.   On: 10/06/2013 21:14   Ct Head Wo Contrast  09/28/2013   CLINICAL DATA:  Intracranial hemorrhage  EXAM: CT HEAD WITHOUT CONTRAST  TECHNIQUE: Contiguous axial images were obtained from the base of the skull through the vertex without intravenous contrast.  COMPARISON:  Prior CT from 09/26/2013  FINDINGS: Right cerebellar hemorrhage is decreased in size now measuring 3.7 x 2.8 cm, previously 4.1 x 2.6 cm. Hemorrhage again seen extending into the fourth ventricle. Intraventricular hemorrhage layering within the posterior horns of the lateral ventricles is decreased from prior. Hemorrhage present at the cerebral aqueduct. Ventriculomegaly involving the lateral and third ventricles is slightly increased. There is increased hypodensity around the frontal horns of the lateral ventricles, suggesting transependymal flow of CSF. This is slightly  more prominent than prior. The fourth ventricle remains slightly compress from a edema related to the adjacent parenchymal hemorrhage. Edema around the hemorrhage is stable to slightly increased.  Atrophy again noted. No new hemorrhage or intracranial infarct. No extra-axial fluid collection. No midline shift. Patchy supratentorial white matter disease again noted.  Scalp soft tissues and calvarium are unchanged. No acute abnormality seen about the orbits. Paranasal sinuses and mastoid air cells remain clear.  IMPRESSION: 1. Interval decrease in size of right cerebellar hemorrhage, now measuring 3.7 x 2.8 cm, previously 4.1 x 2.6 cm. 2. Interval decrease an overall volume of intraventricular hemorrhage with slightly increased ventriculomegaly involving the lateral and third ventricles. Transependymal flow of CSF is slightly more prominent as compared to prior examination, compatible with worsening hydrocephalus.   Electronically Signed   By: Jeannine Boga M.D.   On: 09/28/2013 05:16   Ct Head Wo Contrast  09/26/2013   CLINICAL DATA:  Recent intracranial hemorrhage ; currently presenting with dizziness and nausea  EXAM: CT HEAD WITHOUT CONTRAST  TECHNIQUE: Contiguous axial images were obtained from the base of the skull through the vertex without intravenous contrast.  COMPARISON:  Brain CT and brain MRI September 25, 2013  FINDINGS: The hemorrhage in the right cerebellum has increased in size, currently measuring 4.1 x 2.6 cm. Hemorrhage again is noted extending into the fourth ventricle without significant change in the ventricles superior to this hemorrhage. The fourth ventricle is somewhat compressed as was noted 1 day prior. There is hemorrhage in the atrium of the left lateral ventricle with a fluid -fluid level, slightly larger than 1 day prior.  There is mild generalized atrophy. There is moderate edema surrounding the cerebellar hemorrhage, not significantly changed. There is no new intraparenchymal  focus of hemorrhage. There is no subdural or epidural fluid. There is no midline shift. There is patchy small vessel disease in the centra semiovale bilaterally. Bony calvarium appears intact. The mastoid air cells are clear.  IMPRESSION: Enlargement of the hemorrhage in the right medial cerebellum with slightly more hemorrhage seen in the ventricular system overall. The degree of prominence of the lateral  and third ventricles is stable. Fourth ventricle does remain compressed. There is atrophy with periventricular small vessel disease. No new intraparenchymal hemorrhage. The degree of edema surrounding the hemorrhage in the right cerebellum is not significantly changed.  Critical Value/emergent results were called by telephone at the time of interpretation on 09/26/2013 at 5:27 pm to Dr. Kary Kos , who verbally acknowledged these results.   Electronically Signed   By: Lowella Grip M.D.   On: 09/26/2013 17:27   Ct Head Wo Contrast  09/25/2013   CLINICAL DATA:  Headache and dizziness  EXAM: CT HEAD WITHOUT CONTRAST  TECHNIQUE: Contiguous axial images were obtained from the base of the skull through the vertex without intravenous contrast.  COMPARISON:  08/17/2008  FINDINGS: Skull and Sinuses:Negative for fracture or destructive process. The mastoids, middle ears, and imaged paranasal sinuses are clear.  Orbits: Bilateral cataract resection.  Brain: There is a parenchymal hematoma within the inferior right cerebellum including the right cerebellar tonsil. Maximal diameter 2.6 cm. Intraventricular extension at the level of the fourth ventricle, extending through the cerebral aqueduct into the lateral and third ventricles. There is extension into the foramen the Luschka on the left with CSF haziness at the foramen magnum likely admixed blood. There is mild lateral ventriculomegaly, but inproportion to atrophy. There is edema around the parenchymal hematoma. No discrete infarct.  Chronic small vessel disease with  ischemic gliosis around the lateral ventricles.  Critical Value/emergent results were called by telephone at the time of interpretation on 09/25/2013 at 12:50 am to Dr. Julianne Rice , who verbally acknowledged these results.  IMPRESSION: 2.6 x 1.9 cm hematoma in the inferior right cerebellum. The hemorrhage could be hypertensive or from infarct with hemorrhagic conversion. Subarachnoid and intraventricular extension of hemorrhage, as above.   Electronically Signed   By: Jorje Guild M.D.   On: 09/25/2013 00:55   Ct Chest Wo Contrast  10/09/2013   CLINICAL DATA:  Metastatic disease, infiltrates.  EXAM: CT CHEST WITHOUT CONTRAST  CT ABDOMEN AND PELVIS WITH CONTRAST  TECHNIQUE: Multidetector CT imaging of the chest was performed without intravenous contrast administration.  Multidetector CT imaging of the abdomen and pelvis was performed following the standard protocol during bolus administration of intravenous contrast.  CONTRAST:  152mL OMNIPAQUE IOHEXOL 300 MG/ML  SOLN  COMPARISON:  None.  FINDINGS: CT CHEST FINDINGS  Endotracheal tube is in grossly good position. Nasogastric tube is seen passing through the esophagus with tip in distal stomach. No pneumothorax or significant pleural effusion is noted. Minimal posterior basilar subsegmental atelectasis is noted bilaterally. Minimal ill-defined opacity is noted in the right lower lobe which may represent focal atelectasis or possibly early infiltrate. Coronary artery stents are noted. Right internal jugular catheter is noted with distal tip at the cavoatrial junction. No mediastinal mass or significant adenopathy is noted. Bilateral old lower rib fractures are noted.  CT ABDOMEN AND PELVIS FINDINGS  Gallbladder is distended, but no gallstones or significant gallbladder wall thickening is noted. The liver, spleen and pancreas appear normal. 3.7 cm right adrenal mass is noted consistent with metastatic disease. Left adrenal gland appears normal. Left kidney  appears normal. Atherosclerotic calcifications of abdominal aorta are noted without aneurysm formation. 7.4 x 6.2 cm irregular enhancing mass is seen arising from the right kidney concerning for renal cell carcinoma. There is an adjacent large multilobulated mass in the right retroperitoneal region which measures 10.0 x 5.9 cm most consistent with metastatic disease. There is no evidence of bowel obstruction. Sigmoid  diverticulosis is noted without inflammation. No abnormal fluid collection is noted. Urinary bladder is decompressed secondary to Foley catheter. Mild prostatic hypertrophy is noted.  IMPRESSION: Endotracheal and nasogastric tubes are in grossly good position.  Minimal bilateral posterior basilar subsegmental atelectasis is noted. Also noted is minimal ill-defined opacity in the right lower lobe which may represent focal atelectasis or possibly early pneumonia. Followup radiographs are recommended.  7.4 cm enhancing mass is seen involving the right kidney consistent with renal cell carcinoma. It may evolve the renal vein in the hilum, but does not appear to extend significantly centrally. 3.7 cm right adrenal mass is noted consistent with metastatic disease. Large multiloculated mass is noted in the right retroperitoneal region measuring 10 x 5.9 cm consistent with metastatic disease.   Electronically Signed   By: Sabino Dick M.D.   On: 10/09/2013 13:57   Mr Jodene Nam Head Wo Contrast  09/25/2013   CLINICAL DATA:  Headache with RIGHT-sided weakness. Abnormal CT scan.  EXAM: MRI HEAD WITHOUT CONTRAST  MRA HEAD WITHOUT CONTRAST  TECHNIQUE: Multiplanar, multiecho pulse sequences of the brain and surrounding structures were obtained without intravenous contrast. Angiographic images of the head were obtained using MRA technique without contrast.  COMPARISON:  CT head earlier in the day.  FINDINGS: MRI HEAD FINDINGS  The previously identified RIGHT cerebellar hematoma has increased in size. Cross-section  measurements on current images are 27 x 39 x 28 mm (R-L x A-P x C-C) as compared with 19 x 26 mm earlier today. This computes to an approximate volume of 15 mL. There is increasing surrounding edema. There is mild compression of the fourth ventricle and displacement RIGHT to LEFT. Intraventricular extension of blood is noted in the lateral and third ventricles. There is mild hydrocephalus with enlargement of the lateral and third ventricles including temporal horns. Transependymal absorption surrounds the lateral ventricles. On gradient sequence, there is a tiny a focus of chronic hemorrhage in the RIGHT posterior frontal subcortical white matter, not present in 2010, not clearly present on CT, likely representing chronic microbleed.  No other acute findings.  Otherwise stable chronic changes.  MRA HEAD FINDINGS  The internal carotid arteries are dolichoectatic but patent. The basilar artery is widely patent with LEFT vertebral dominant. RIGHT vertebral ends in PICA. No intracranial stenosis or aneurysm. No arteriovenous malformation or berry aneurysm is seen.  IMPRESSION: Enlarging RIGHT cerebellar hematoma, now 27 x 39 x 28 mm. Etiology likely related to combination of hypertension in the setting of anticoagulation. Increasing intraventricular hemorrhage and early hydrocephalus. A call is into the consulting neurosurgeon.   Electronically Signed   By: Rolla Flatten M.D.   On: 09/25/2013 12:40   Mr Brain Wo Contrast  09/25/2013   CLINICAL DATA:  Headache with RIGHT-sided weakness. Abnormal CT scan.  EXAM: MRI HEAD WITHOUT CONTRAST  MRA HEAD WITHOUT CONTRAST  TECHNIQUE: Multiplanar, multiecho pulse sequences of the brain and surrounding structures were obtained without intravenous contrast. Angiographic images of the head were obtained using MRA technique without contrast.  COMPARISON:  CT head earlier in the day.  FINDINGS: MRI HEAD FINDINGS  The previously identified RIGHT cerebellar hematoma has increased in  size. Cross-section measurements on current images are 27 x 39 x 28 mm (R-L x A-P x C-C) as compared with 19 x 26 mm earlier today. This computes to an approximate volume of 15 mL. There is increasing surrounding edema. There is mild compression of the fourth ventricle and displacement RIGHT to LEFT. Intraventricular extension of  blood is noted in the lateral and third ventricles. There is mild hydrocephalus with enlargement of the lateral and third ventricles including temporal horns. Transependymal absorption surrounds the lateral ventricles. On gradient sequence, there is a tiny a focus of chronic hemorrhage in the RIGHT posterior frontal subcortical white matter, not present in 2010, not clearly present on CT, likely representing chronic microbleed.  No other acute findings.  Otherwise stable chronic changes.  MRA HEAD FINDINGS  The internal carotid arteries are dolichoectatic but patent. The basilar artery is widely patent with LEFT vertebral dominant. RIGHT vertebral ends in PICA. No intracranial stenosis or aneurysm. No arteriovenous malformation or berry aneurysm is seen.  IMPRESSION: Enlarging RIGHT cerebellar hematoma, now 27 x 39 x 28 mm. Etiology likely related to combination of hypertension in the setting of anticoagulation. Increasing intraventricular hemorrhage and early hydrocephalus. A call is into the consulting neurosurgeon.   Electronically Signed   By: Rolla Flatten M.D.   On: 09/25/2013 12:40   Mr Jeri Cos VZ Contrast  10/11/2013   CLINICAL DATA:  Known intracranial hemorrhage. Suspected with renal cell carcinoma. Evaluate for underlying intracranial lesion. Hypertension.  EXAM: MRI HEAD WITHOUT AND WITH CONTRAST  TECHNIQUE: Multiplanar, multiecho pulse sequences of the brain and surrounding structures were obtained without and with intravenous contrast.  CONTRAST:  47mL MULTIHANCE GADOBENATE DIMEGLUMINE 529 MG/ML IV SOLN  COMPARISON:  Several prior examinations, most recent head CT 10/06/2013  and most recent MR 09/25/2013.  FINDINGS: 3.7 x 3.7 x 3.5 cm right cerebellar hematoma has changed characteristics since prior examination now appearing better defined with peripheral hemosiderin rim and surrounding vasogenic edema. Overall size is minimally changed. This continues to cause mass effect upon the fourth ventricle and the outflow of the fourth ventricle with distortion of the right cerebellum, right middle cerebral peduncle, right aspect of the vermis and right medulla. Slight decrease in size of temporal horns.  Breakthrough of hemorrhage into the lateral ventricular system with dependent blood in the left lateral ventricle. Currently, no significant hydrocephalus.  No enhancing lesion is seen separate from the hematoma and it is possible this represents a hematoma related to patient's anticoagulation. Given the large mass in the patient's abdomen combined with slow clearance of intracranial hemorrhage, follow-up until complete clearance to exclude underlying intracranial mass (not detected on the current exam) recommended.  No intracranial mass lesion otherwise noted.  No acute thrombotic infarct.  Small right vertebral artery ends in a right posterior inferior cerebellar artery distribution. Major intracranial vascular structures are patent.  Partial opacification mastoid air cells greater on the right.  IMPRESSION: Evolution of the right cerebellar hematoma as detailed above. There is continued mass effect upon adjacent structures although with slight decrease in size of temporal horns.  No enhancing lesion is seen separate from the hematoma and it is possible this represents a hematoma related to patient's anticoagulation. Given the large mass in the patient's abdomen combined with slow clearance of intracranial hemorrhage, follow-up until complete clearance to exclude underlying intracranial mass recommended.   Electronically Signed   By: Chauncey Cruel M.D.   On: 10/11/2013 13:49   US Abdomen  Complete  10/08/2013   CLINICAL DATA:  Sepsis.  EXAM: ULTRASOUND ABDOMEN COMPLETE  COMPARISON:  Abdomen 10/07/2013.  FINDINGS: Gallbladder:  Multiple mobile gallstones. Gallbladder wall thickness 3 mm. This is upper limits normal. Negative Murphy sign. No pericholecystic fluid.  Common bile duct:  Diameter: 5 mm  Liver:  No focal lesion identified. Within normal limits in  parenchymal echogenicity.  IVC:  No abnormality visualized.  Pancreas:  Visualized portion unremarkable.  Spleen:  Size and appearance within normal limits.  Right Kidney:  Length: 11.6 cm. Echogenicity within normal limits. No hydronephrosis. There appears to be a right mid renal 3.9 x 3.2 x 4.2 cm mass. This is suspicious for renal cell carcinoma. This are associated with large retroperitoneal solid masses. Mass adjacent to the abdominal aorta measures 9.6 cm in maximum diameter, mass adjacent to the IVC measures 5.3 cm in maximum diameter. These could represent metastatic lesions, primary retroperitoneal tumor, and lymphoma. Contrast-enhanced CT of the abdomen pelvis suggested to further evaluate.  Left Kidney:  Length: 12.0 cm. Echogenicity within normal limits. No mass or hydronephrosis visualized.  Abdominal aorta:  No aneurysm visualized.  Other findings:  None.  IMPRESSION: 1. Multiple gallstones. 2. 4.2 cm right renal mass. This is suspicious for renal cell carcinoma. 3. Two large solid retroperitoneal mass lesions, the largest measures 9.6 cm in diameter. Metastatic disease, primary retroperitoneal tumor, and lymphoma could present in this fashion. Contrast -enhanced CT of the abdomen and pelvis is suggested for further evaluation of these findings.   Electronically Signed   By: Marcello Moores  Register   On: 10/08/2013 18:54   Ct Abdomen Pelvis W Contrast  10/09/2013   CLINICAL DATA:  Metastatic disease, infiltrates.  EXAM: CT CHEST WITHOUT CONTRAST  CT ABDOMEN AND PELVIS WITH CONTRAST  TECHNIQUE: Multidetector CT imaging of the chest was  performed without intravenous contrast administration.  Multidetector CT imaging of the abdomen and pelvis was performed following the standard protocol during bolus administration of intravenous contrast.  CONTRAST:  134mL OMNIPAQUE IOHEXOL 300 MG/ML  SOLN  COMPARISON:  None.  FINDINGS: CT CHEST FINDINGS  Endotracheal tube is in grossly good position. Nasogastric tube is seen passing through the esophagus with tip in distal stomach. No pneumothorax or significant pleural effusion is noted. Minimal posterior basilar subsegmental atelectasis is noted bilaterally. Minimal ill-defined opacity is noted in the right lower lobe which may represent focal atelectasis or possibly early infiltrate. Coronary artery stents are noted. Right internal jugular catheter is noted with distal tip at the cavoatrial junction. No mediastinal mass or significant adenopathy is noted. Bilateral old lower rib fractures are noted.  CT ABDOMEN AND PELVIS FINDINGS  Gallbladder is distended, but no gallstones or significant gallbladder wall thickening is noted. The liver, spleen and pancreas appear normal. 3.7 cm right adrenal mass is noted consistent with metastatic disease. Left adrenal gland appears normal. Left kidney appears normal. Atherosclerotic calcifications of abdominal aorta are noted without aneurysm formation. 7.4 x 6.2 cm irregular enhancing mass is seen arising from the right kidney concerning for renal cell carcinoma. There is an adjacent large multilobulated mass in the right retroperitoneal region which measures 10.0 x 5.9 cm most consistent with metastatic disease. There is no evidence of bowel obstruction. Sigmoid diverticulosis is noted without inflammation. No abnormal fluid collection is noted. Urinary bladder is decompressed secondary to Foley catheter. Mild prostatic hypertrophy is noted.  IMPRESSION: Endotracheal and nasogastric tubes are in grossly good position.  Minimal bilateral posterior basilar subsegmental  atelectasis is noted. Also noted is minimal ill-defined opacity in the right lower lobe which may represent focal atelectasis or possibly early pneumonia. Followup radiographs are recommended.  7.4 cm enhancing mass is seen involving the right kidney consistent with renal cell carcinoma. It may evolve the renal vein in the hilum, but does not appear to extend significantly centrally. 3.7 cm right adrenal mass  is noted consistent with metastatic disease. Large multiloculated mass is noted in the right retroperitoneal region measuring 10 x 5.9 cm consistent with metastatic disease.   Electronically Signed   By: Sabino Dick M.D.   On: 10/09/2013 13:57   Ct Biopsy  10/14/2013   CLINICAL DATA:  78 year old male with enhancing right renal mass and a massive right retroperitoneal mass/ adenopathy. Imaging findings are highly concerning for a metastatic renal cell carcinoma. CT-guided biopsy is warranted for tissue diagnosis.  EXAM: CT BIOPSY  Date: 10/14/2013  PROCEDURE: 1. CT-guided biopsy of right retroperitoneal mass Interventional Radiologist:  Criselda Peaches, MD  ANESTHESIA/SEDATION: Moderate (conscious) sedation was used. 0.5 mg Versed, 25 mcg Fentanyl were administered intravenously. The patient's vital signs were monitored continuously by radiology nursing throughout the procedure.  Sedation Time: 12 minutes  TECHNIQUE: Informed consent was obtained from the patient following explanation of the procedure, risks, benefits and alternatives. The patient understands, agrees and consents for the procedure. All questions were addressed. A time out was performed.  Planning axial CT scan was performed. The right retroperitoneal soft tissue mass was identified. A suitable skin entry site was selected and marked. The region was then sterilely prepped and draped in the standard fashion using Betadine skin prep. Local anesthesia was attained by infiltration with 1% lidocaine. A small dermatotomy was made.  Under  intermittent CT fluoroscopic guidance, a 17 gauge trocar needle was advanced through the right paraspinal musculature and into the margin of the mass. Multiple 18 gauge core biopsies were then coaxially obtained using the bio Pince automated biopsy device. Biopsy specimens were divided amongst formalin and saline and sent to pathology for further evaluation.  Post biopsy axial CT imaging demonstrates no evidence of retroperitoneal hematoma or other abnormality. The needle was removed. The patient tolerated the procedure well.  COMPLICATIONS: None  IMPRESSION: Technically successful CT-guided core biopsy of right retroperitoneal soft tissue mass.  Signed,  Criselda Peaches, MD  [facility]  [facility]   Electronically Signed   By: Jacqulynn Cadet M.D.   On: 10/14/2013 19:31   Dg Chest Port 1 View  10/10/2013   CLINICAL DATA:  Edema.  EXAM: PORTABLE CHEST - 1 VIEW  COMPARISON:  Chest radiograph 10/09/2013  FINDINGS: Right IJ central venous catheter tip projects over the superior cavoatrial junction. Interval extubation. Removal of the enteric tube. Stable cardiomegaly. Mild bilateral interstitial pulmonary opacities. No definite pleural effusion. Linear radiodensity projecting over the right lung apex is favored to be external to the patient.  IMPRESSION: ET tube is no longer visualized, most compatible with interval extubation.  Cardiomegaly with mild interstitial pulmonary edema.a  Linear radiodensity projecting over the right lung apex is favored to be external to the patient, recommend clinical correlation.   Electronically Signed   By: Lovey Newcomer M.D.   On: 10/10/2013 07:46   Dg Chest Port 1 View  10/09/2013   CLINICAL DATA:  Assess support tube and line placement  EXAM: PORTABLE CHEST - 1 VIEW  COMPARISON:  portable chest x-ray of October 08, 2013  FINDINGS: The lungs are mildly hypoinflated. The interstitial markings remain mildly increased. The cardiac  silhouette is normal in size. The pulmonary vascularity is not engorged.  The endotracheal tube lies 4.1 cm above the crotch of the carina. The esophagogastric tube tip in proximal port below project below the hemidiaphragm. The right internal jugular venous catheter tip lies in the region of the distal SVC.  IMPRESSION: There  is stable positioning of the support tubes and lines. There has been slight interval improvement in the appearance of the pulmonary interstitium since yesterday's study.   Electronically Signed   By: David  Martinique   On: 10/09/2013 07:48   Dg Chest Port 1 View  10/08/2013   CLINICAL DATA:  Evaluate endotracheal tube positioning  EXAM: PORTABLE CHEST - 1 VIEW  COMPARISON:  10/07/2013; 10/06/2013; 09/08/2013  FINDINGS: Grossly unchanged cardiac silhouette and mediastinal contours. Stable position of support apparatus. Minimally improved aeration of the lungs. Grossly unchanged bilateral infrahilar heterogeneous opacities, left greater than right. No new focal airspace opacities. No pleural effusion or pneumothorax. No definite evidence of edema. Unchanged bones.  IMPRESSION: 1.  Stable positioning of support apparatus.  No pneumothorax. 2. Minimally improved aeration of lungs suggests resolving atelectasis.   Electronically Signed   By: Sandi Mariscal M.D.   On: 10/08/2013 08:33   Dg Chest Port 1 View  10/07/2013   CLINICAL DATA:  Evaluate line placement  EXAM: PORTABLE CHEST - 1 VIEW  COMPARISON:  10/06/2013  FINDINGS: Endotracheal tube tip is above the carina. There is a right IJ catheter with tip in the projection of the cavoatrial junction. No pneumothorax identified. There is a nasogastric tube with tip in the stomach. Heart size is normal. No pleural or pericardial effusion.  IMPRESSION: 1. The tip of the right IJ catheter is in the cavoatrial junction. No pneumothorax identified.   Electronically Signed   By: Kerby Moors M.D.   On: 10/07/2013 19:13   Dg Abd Portable 1v  10/12/2013    CLINICAL DATA:  Feeding tube placement.  EXAM: PORTABLE ABDOMEN - 1 VIEW  COMPARISON:  10/11/2013  FINDINGS: Feeding tube extends into the proximal stomach. No evidence of bowel obstruction or ileus.  IMPRESSION: Tip of feeding tube remains in the proximal stomach.   Electronically Signed   By: Aletta Edouard M.D.   On: 10/12/2013 16:43   Dg Abd Portable 1v  10/11/2013   CLINICAL DATA:  Evaluate NG tube placement  EXAM: PORTABLE ABDOMEN - 1 VIEW  COMPARISON:  None.  FINDINGS: Weighted tip enteric tube projects over the expected location of the gastric fundus.  Residual enteric contrast is seen throughout the colon.  IMPRESSION: Weighted tip enteric tube projects over the gastric fundus.   Electronically Signed   By: Sandi Mariscal M.D.   On: 10/11/2013 18:12   Dg Abd Portable 1v  10/07/2013   CLINICAL DATA:  Nasogastric tube placement.  EXAM: PORTABLE ABDOMEN - 1 VIEW  COMPARISON:  None.  FINDINGS: Nasogastric tube tip projects at gastroesophageal junction. Moderate amount of retained large bowel stool. Nonspecific bowel gas pattern partially imaged. Imaged lung bases are clear, central venous catheter at distal superior vena cava region. Tissue planes and included osseous structures are nonsuspicious.  IMPRESSION: Nasogastric tube tip projects at GE junction, recommend advancement.  Moderate retained large bowel stool, partially imaged.   Electronically Signed   By: Elon Alas   On: 10/07/2013 19:03   Dg Swallowing Func-speech Pathology  10/17/2013   Objective Swallowing Evaluation: Modified Barium Swallowing Study  Patient Details  Name: Taylor Keller  MRN: [id number]  Date of Birth: October 22, 1935  Today's Date: 10/17/2013  Time: 1030-1100  SLP Time Calculation (min): 30 min  Past Medical History:  Past Medical History   Diagnosis  Date   .  Lumbar stenosis      L5   .  Hypothyroidism    .  Hyperlipidemia    .  Hypertension    .  ED (erectile dysfunction)    .  Internal carotid artery stenosis  5/15      bilateral, 40-59%   .  Myocardial infarction  08/2013   .  Heart murmur    .  Stroke  ~ 2011     "slight; no permanent damage"    Past Surgical History:  Past Surgical History   Procedure  Laterality  Date   .  Tonsillectomy and adenoidectomy     .  Appendectomy     .  Coronary angioplasty with stent placement   08/2013     "2"   .  Cataract extraction, bilateral  Bilateral  ~ 2012    HPI:  78 year old male with recent cardiac arrest 2nd to MI and cerebellar  hemorrhage. Transferred to CIR 8/25. Developed sepsis and resp failure and  readmitted to [facility] ICU on 9/1. OETT 9/1-9/3; during this admission found to  have right renal and peritoneal mass; followed by [facility] and awaiting  biopsy results. Pt has hx of neurogenic dysphagia and has been followed by  SLP services since CIR admission. Has undergone multiple MBS studies, the  last of which was 9/5 and revealed a deterioration in swallow function  with severe pharyngeal dysphagia (silent aspiration and severe pharyngeal  residue to due poor mobility hyolaryngeal complex). Pt is currently NPO  with enteral feedings. Has been engaging in therapeutic exercise and  repeat MBS recommended for 10/17/13.   Assessment / Plan / Recommendation  Clinical Impression  Dysphagia Diagnosis: Moderate oral phase  dysphagia;Severe pharyngeal phase dysphagia  Clinical impression: Pt presents with continued mod-severe oropharyngeal  dysphagia, with marginal improvements since 9/5 study. There continues to  be reduced oral manipulation, poor propulsion of materials throughout  oropharynx, and reduced mobility of hyolaryngeal complex. There was  immediate and persistent aspiration of honey-thick liquids - yet pt is now  demonstrating sensation of aspirate, which triggers a delayed/weak cough  response (non-protective). He may begin trial boluses of purees with SLP  during therapy, but swallow function is not sufficient to begin POs.  Recommend continuing enteral feeding; would postpone PEG  discussion as: 1)  prognosis for swallow recovery is promising; 2) new dx of renal mass and  prognosis may impact pt/wife's potential decisions about long-term enteral  feeding. SLP will follow for dysphagia treatment.   Treatment Recommendation  Therapy as outlined in treatment plan below   Diet Recommendation  NPO  Medication Administration: Via alternative means   Other Recommendations  Oral Care Recommendations: (QD)      Frequency and Duration  min 3x week  2 weeks      SLP Swallow Goals   General  HPI: 78 year old male with recent cardiac arrest 2nd to MI and  cerebellar hemorrhage. Transferred to CIR 8/25. Developed sepsis and resp  failure and readmitted to [facility] ICU on 9/1. OETT 9/1-9/3; during this  admission found to have right renal and peritoneal mass; followed by  [facility] and awaiting biopsy results. Pt has hx of neurogenic dysphagia and  has been followed by SLP services since CIR admission. Has undergone  multiple MBS studies, the last of which was 9/5 and revealed a  deterioration in swallow function with severe pharyngeal dysphagia (silent  aspiration and severe pharyngeal residue to due poor mobility hyolaryngeal  complex). Pt is currently NPO with enteral feedings. Has been engaging in  therapeutic exercise and repeat MBS recommended  for today.  Type of Study: Modified Barium Swallowing Study  Reason for Referral: Objectively evaluate swallowing function  Previous Swallow Assessment: 9/5: Pt.'s swallow function has decreased  from [facility] 09/08/13. Significantly reduced tongue base retraction, laryngeal  elevation and epiglottic deflection led to moderate-severe silent  aspiration during and after the swallow from vallecular and pyriform sinus  residue. Chin tuck posture and cues to cough were unsuccessful in  mitigating and removing aspiration. Pt. not safe with any po consistency  at this time, discussed with MD temporary means of nutrition; continue  oral care and ST for trial pharyngeal strengthening  exercises due to motor  weakness.  Diet Prior to this Study: NPO;Panda  Temperature Spikes Noted: No  Respiratory Status: Room air  History of Recent Intubation: Yes  Length of Intubations (days): 2 days  Date extubated: 10/09/13  Behavior/Cognition: Alert;Cooperative;Pleasant mood  Oral Cavity - Dentition: Adequate natural dentition  Oral Motor / Sensory Function: Impaired - see Bedside swallow eval  Self-Feeding Abilities: Able to feed self  Patient Positioning: Upright in chair  Baseline Vocal Quality: Low vocal intensity;Clear  Volitional Cough: Weak  Volitional Swallow: Able to elicit  Anatomy: Within functional limits  Pharyngeal Secretions: Not observed secondary MBS   Reason for Referral  Objectively evaluate swallowing function   Oral Phase  Oral Preparation/Oral Phase  Oral Phase: Impaired  Oral - Honey  Oral - Honey Teaspoon: Impaired mastication;Weak lingual  manipulation;Lingual pumping;Reduced posterior propulsion  Oral - Honey Cup: Not tested  Oral - Solids  Oral - Puree: Impaired mastication;Weak lingual manipulation;Lingual  pumping;Reduced posterior propulsion   Pharyngeal Phase  Pharyngeal Phase  Pharyngeal Phase: Impaired  Pharyngeal - Honey  Pharyngeal - Honey Teaspoon: Penetration/Aspiration during swallow;Reduced  laryngeal elevation;Reduced airway/laryngeal closure;Reduced tongue base  retraction;Pharyngeal residue - valleculae;Pharyngeal residue - pyriform  sinuses;Moderate aspiration  Penetration/Aspiration details (honey teaspoon): Material enters airway,  passes BELOW cords and not ejected out despite cough attempt by patient  Pharyngeal - Honey Cup: Not tested  Penetration/Aspiration details (honey cup): Material enters airway, passes  BELOW cords and not ejected out despite cough attempt by patient  Pharyngeal - Solids  Pharyngeal - Puree: Pharyngeal residue - valleculae;Reduced tongue base  retraction;Pharyngeal residue - pyriform sinuses;Reduced laryngeal  elevation;Reduced pharyngeal  peristalsis;Reduced anterior laryngeal  mobility  Penetration/Aspiration details (puree): Material enters airway, remains  ABOVE vocal cords and not ejected out   Cervical Esophageal Phase  Amanda L. [place], [place] CCC/SLP  Pager [phone]     Juan Quam [name]   Dg Swallowing Func-speech Pathology  10/11/2013   Orbie Pyo [place], CCC-SLP     10/11/2013  5:22 PM Objective Swallowing Evaluation: Modified Barium Swallowing Study   Patient Details  Name: Taylor Keller MRN: [id number] Date of Birth: 07-May-1935  Today's Date: 10/11/2013 Time: 4008-6761 SLP Time Calculation (min): 23 min  Past Medical History:  Past Medical History  Diagnosis Date  . Lumbar stenosis     L5  . Hypothyroidism   . Hyperlipidemia   . Hypertension   . ED (erectile dysfunction)   . Internal carotid artery stenosis 5/15    bilateral, 40-59%  . Myocardial infarction 08/2013  . Heart murmur   . Stroke ~ 2011    "slight; no permanent damage"   Past Surgical History:  Past Surgical History  Procedure Laterality Date  . Tonsillectomy and adenoidectomy    . Appendectomy    . Coronary angioplasty with stent placement  08/2013    "2"  . Cataract extraction,  bilateral Bilateral ~ 2012   HPI:  78 yo M with cerebellar hemorrhage. MRI shows 2.6 x 1.9 cm  hematoma in the inferior right cerebellum. Pt also has a history  of recent MI and cardiac arrest, intubated 7 days, who was  discharged from [facility] general hospital on 7/31,  Pt was  d/c'd home on Honey thick liquids. Readmitted to [facility] with CHF.   Found to have dysphagia with silent aspiration s/p extubation.but  underwent an MBS on 8/3 showing mild sensori-motor pharyngeal  dysphagia, characterized by decreased base of tongue contraction  to the pharyngeal wall and decreased laryngeal elevation and  epiglottic deflection. Pt recommended to consume a dys 3 diet  with thin liquids with a chin tuck and second swallow to clear  residue. Pt was progressing well on CIR until he became septic  and was  transferred back to acute care with two-day intubation.  SLP was ordered to assess readiness to restart PO diet. Pt showed  s/s of aspiration with BSE on 9/3 and remained NPO.     Assessment / Plan / Recommendation Clinical Impression  Dysphagia Diagnosis: Mild oral phase dysphagia;Severe pharyngeal  phase dysphagia Clinical impression: Pt.'s swallow function has decreased from  [facility] 09/08/13.  Significantly reduced tongue base retraction,  laryngeal elevation and epiglottic deflection led to  moderate-severe silent aspiration during and after the swallow  from vallecular and pyriform sinus residue.  Chin tuck posture  and cues to cough were unsuccessful in mitigating and removing  aspiration.  Pt. not safe with any po consistency at this time,  discussed with MD temporary means of nutrition; continue oral  care and ST for trial pharyngeal strengthening exercises due to  motor weakness.    Treatment Recommendation  Therapy as outlined in treatment plan below    Diet Recommendation NPO   Medication Administration: Crushed with puree Compensations: Slow rate;Small sips/bites (with meds) Postural Changes and/or Swallow Maneuvers: Seated upright 90  degrees    Other  Recommendations Oral Care Recommendations: Oral care BID   Follow Up Recommendations   (TBD)    Frequency and Duration min 2x/week  2 weeks   Pertinent Vitals/Pain WDL          Reason for Referral Objectively evaluate swallowing function   Oral Phase Oral Preparation/Oral Phase Oral Phase: Impaired Oral - Honey Oral - Honey Teaspoon: Delayed oral transit Oral - Honey Cup: Delayed oral transit Oral - Solids Oral - Puree: Delayed oral transit   Pharyngeal Phase Pharyngeal Phase Pharyngeal Phase: Impaired Pharyngeal - Honey Pharyngeal - Honey Teaspoon: Penetration/Aspiration during  swallow;Reduced laryngeal elevation;Reduced airway/laryngeal  closure;Reduced tongue base retraction;Pharyngeal residue -  valleculae;Pharyngeal residue - pyriform sinuses;Moderate   aspiration Penetration/Aspiration details (honey teaspoon): Material enters  airway, passes BELOW cords without attempt by patient to eject  out (silent aspiration) Pharyngeal - Honey Cup: Penetration/Aspiration during  swallow;Reduced laryngeal elevation;Reduced airway/laryngeal  closure;Reduced tongue base retraction;Pharyngeal residue -  valleculae;Pharyngeal residue - pyriform sinuses;Moderate  aspiration Penetration/Aspiration details (honey cup): Material enters  airway, passes BELOW cords without attempt by patient to eject  out (silent aspiration) Pharyngeal - Solids Pharyngeal - Puree: Pharyngeal residue -  valleculae;Penetration/Aspiration during swallow;Reduced tongue  base retraction;Pharyngeal residue - pyriform sinuses;Reduced  laryngeal elevation Penetration/Aspiration details (puree): Material enters airway,  remains ABOVE vocal cords and not ejected out  Cervical Esophageal Phase    GO    Cervical Esophageal Phase Cervical Esophageal Phase: [name]         [place] Siren 10/11/2013,  12:14 PM Orbie Pyo [place] M.Ed Engineer, agricultural [phone]      Micro Results      No results found for this or any previous visit (from the past 240 hour(s)).     Today   Subjective:   Taylor Keller today has no headache,no chest abdominal pain,no new weakness tingling or numbness, feels much better wants to go home today.    Objective:   Blood pressure 145/73, pulse 82, temperature 98.6 F (37 C), temperature source Oral, resp. rate 17, height 6' (1.829 m), weight 73.5 kg (162 lb 0.6 oz), SpO2 97.00%.   Intake/Output Summary (Last 24 hours) at 10/21/13 0852 Last data filed at 10/21/13 0518  Gross per 24 hour  Intake    540 ml  Output    380 ml  Net    160 ml    Exam Awake Alert, frail, Oriented x 3, No new F.N deficits, some old L sided weakness, Normal affect [place].AT,PERRAL Supple Neck,No JVD, No cervical lymphadenopathy appriciated.  Symmetrical Chest wall movement, Good air  movement bilaterally, CTAB RRR,No Gallops,Rubs or new Murmurs, No Parasternal Heave +ve B.Sounds, Abd Soft, Non tender, No organomegaly appriciated, No rebound -guarding or rigidity. No Cyanosis, Clubbing or edema, No new Rash or bruise  Data Review   CBC w Diff: Lab Results  Component Value Date   WBC 7.4 10/16/2013   HGB 8.7* 10/16/2013   HCT 28.4* 10/16/2013   PLT 354 10/16/2013   LYMPHOPCT 11* 10/15/2013   MONOPCT 9 10/15/2013   EOSPCT 1 10/15/2013   BASOPCT 0 10/15/2013    CMP: Lab Results  Component Value Date   NA 139 10/16/2013   K 4.2 10/16/2013   CL 102 10/16/2013   CO2 25 10/16/2013   BUN 23 10/16/2013   CREATININE 0.73 10/16/2013   CREATININE 1.12 09/19/2013   PROT 6.7 10/15/2013   ALBUMIN 2.0* 10/15/2013   BILITOT 0.4 10/15/2013   ALKPHOS 56 10/15/2013   AST 13 10/15/2013   ALT 9 10/15/2013  .   Total Time in preparing paper work, data evaluation and todays exam - 35 minutes  Thurnell Lose M.D on 10/21/2013 at 8:52 AM  [facility] Office  [phone]   **Disclaimer: This note may have been dictated with voice recognition software. Similar sounding words can inadvertently be transcribed and this note may contain transcription errors which may not have been corrected upon publication of note.**

## 2013-10-21 NOTE — Progress Notes (Signed)
NUTRITION FOLLOW-UP  DOCUMENTATION CODES Per approved criteria  -Severe malnutrition in the context of acute illness   Pt meets criteria for severe MALNUTRITION in the context of acute illness as evidenced by moderate subcutaneous fat and severe muscle depletion.  INTERVENTION: Educated wife about pt's diet post discharge.  NUTRITION DIAGNOSIS: Inadequate oral intake related to inability to eat as evidenced by NPO status; just advanced to dysphagia 1 diet; improved.   Goal: Pt to meet >/= 90% of their estimated nutrition needs; met  Monitor:  PO intake, weight, labs, I/O's  ASSESSMENT: 78 year old male with recent cardiac arrest 2nd to MI, and cerebellar hemorrhage. In inpatient rehab, he developed fevers and lethargy. 9/1 readmitted to NICU for presumed septic shock, resp failure, fever 103.  9/7-Pt extubated 9/3, he failed swallow eval on this date. Pt does have a hx of dysphagia, repeat MBS failed 9/5.  Pt with suspected new cancer by CT, biopsy pending.  Adult TF protocol started 9/5.  Jevity 1.2 @ 50 ml/hr providing with 30 ml Prostat BID: 1200 ml, 1640 kcal, 97 grams protein, and 984 ml H2O. Per RN pt is tolerating TF well. No residuals and abdominal exam WDL.  Nasogastric feeding tube with tip in the proximal stomach.   9/11- Pt with large multiloculated mass noted in the right retroperitoneal region measuring 10 x 5.9 cm consistent with metastatic disease. Spoke with RN, pt has been tolerating the tube feeding well with little to no residuals. Pt also with no stomach pains and no loose stools. Pt does have nausea at time, however it is not related to the tube feeding. Pt underwent a MBS, per SLP pt with moderate oral phase dysphagia and severe pharyngeal phase dysphagia and recommends to continue NPO status. Pt is currently receiving Jevity 1.2 at goal rate of 65 ml/hr with 30 ml Prostat TID per tube providing 1560 ml, 2172 kcal, 131 grams protein, and 1263 ml H2O.  9/15- Pt  advanced to dysphagia 1 diet with pudding thick liquids. Pt to discharge in afternoon. Educated wife about pt's diet and making sure he is meeting his calorie and protein needs. Recommended oral supplements thickened to appropriate consistency. Educated on fluid needs.    Height: Ht Readings from Last 1 Encounters:  10/14/13 6' (1.829 m)    Weight: Wt Readings from Last 1 Encounters:  10/21/13 162 lb 0.6 oz (73.5 kg)  10/07/13  171 lb   BMI:  Body mass index is 21.97 kg/(m^2).  Re-Estimated Nutritional Needs: Kcal: 2000-2300 Protein: 115-125g Fluid: 2 L/ day  Skin: scratches, ecchymosis  Diet Order: Dysphagia 1 with pudding thick liquids    Intake/Output Summary (Last 24 hours) at 10/21/13 1339 Last data filed at 10/21/13 0518  Gross per 24 hour  Intake    540 ml  Output    300 ml  Net    240 ml    Last BM: 9/15  Labs:   Recent Labs Lab 10/15/13 0300 10/16/13 0533  NA 139 139  K 3.2* 4.2  CL 102 102  CO2 26 25  BUN 24* 23  CREATININE 0.80 0.73  CALCIUM 8.6 8.3*  MG 2.2  --   GLUCOSE 134* 120*    CBG (last 3)   Recent Labs  10/20/13 0756 10/20/13 1208 10/20/13 1721  GLUCAP 119* 117* 105*    Scheduled Meds: . antiseptic oral rinse  7 mL Mouth Rinse QID  . aspirin  81 mg Per Tube Daily  . atorvastatin  80  mg Per Tube QPM  . chlorhexidine  15 mL Mouth Rinse BID  . clopidogrel  75 mg Per Tube Daily  . [START ON 10/22/2013] heparin  5,000 Units Subcutaneous 3 times per day  . levothyroxine  175 mcg Per Tube QAC breakfast  . metoprolol tartrate  12.5 mg Per Tube BID  . nitroGLYCERIN  0.5 inch Topical 4 times per day  . pantoprazole sodium  40 mg Per Tube Daily  . sodium chloride  10-40 mL Intracatheter Q12H    Continuous Infusions: . sodium chloride Stopped (10/20/13 1055)   Kallie Locks, MS, Provisional LDN Pager # [phone] After hours/ weekend pager # [phone]

## 2013-10-21 NOTE — Progress Notes (Signed)
Discharge instructions reviewed with pt and spouse; allowing time for questions. Pt and wife verbalized understanding. IV removed without issue. Pt leaving unit via wheelchair (pt's own wheelchair) accompanied by Probation officer.

## 2013-10-21 NOTE — Progress Notes (Signed)
Gave pt's wife prescriptions for discharge so that she can take them to the pharmacy before pt leaves hospital.

## 2013-10-22 ENCOUNTER — Telehealth: Payer: Self-pay | Admitting: Family Medicine

## 2013-10-22 ENCOUNTER — Ambulatory Visit (HOSPITAL_COMMUNITY): Payer: Medicare HMO

## 2013-10-22 LAB — GLUCOSE, CAPILLARY: GLUCOSE-CAPILLARY: 111 mg/dL — AB (ref 70–99)

## 2013-10-22 MED ORDER — ONDANSETRON HCL 4 MG PO TABS: 4.0000 mg | ORAL_TABLET | ORAL | Status: DC | PRN

## 2013-10-22 MED ORDER — PROMETHAZINE HCL 25 MG PO TABS: 25.0000 mg | ORAL_TABLET | ORAL | Status: DC | PRN

## 2013-10-22 NOTE — Telephone Encounter (Signed)
Manuela Schwartz patients wife calling to say that he just got out of hospital and there was a mix up with his medication and he did not get his zofran he really needs this because he is throwing up all of the medication that he needs  She cannot leave him today, but they have a neighbor that is willing to go get it for them at the [facility] [address] if we can do this for him. She does not care how much this cost either. He desperately needs it she says.

## 2013-10-22 NOTE — Telephone Encounter (Signed)
Med sent to pharmacy.

## 2013-10-22 NOTE — Telephone Encounter (Signed)
Insurance will not cover Zofran. Per [facility]P ok to switch to phenegran 25mg  1 po q 4 hours prn. Pt's wife aware and med called to pharm.

## 2013-10-24 ENCOUNTER — Ambulatory Visit (HOSPITAL_COMMUNITY): Payer: Medicare HMO

## 2013-10-29 ENCOUNTER — Ambulatory Visit (HOSPITAL_COMMUNITY): Payer: Medicare HMO

## 2013-10-31 ENCOUNTER — Encounter: Payer: Self-pay | Admitting: Family Medicine

## 2013-10-31 ENCOUNTER — Ambulatory Visit (HOSPITAL_COMMUNITY): Payer: Medicare HMO

## 2013-10-31 ENCOUNTER — Ambulatory Visit (INDEPENDENT_AMBULATORY_CARE_PROVIDER_SITE_OTHER): Payer: Medicare HMO | Admitting: Family Medicine

## 2013-10-31 VITALS — BP 114/60 | HR 80 | Temp 98.0°F | Resp 18 | Wt 147.0 lb

## 2013-10-31 DIAGNOSIS — Z09 Encounter for follow-up examination after completed treatment for conditions other than malignant neoplasm: Secondary | ICD-10-CM

## 2013-10-31 DIAGNOSIS — C801 Malignant (primary) neoplasm, unspecified: Secondary | ICD-10-CM | POA: Insufficient documentation

## 2013-10-31 LAB — CBC WITH DIFFERENTIAL/PLATELET
Basophils Absolute: 0 10*3/uL (ref 0.0–0.1)
Basophils Relative: 0 % (ref 0–1)
Eosinophils Absolute: 0 10*3/uL (ref 0.0–0.7)
Eosinophils Relative: 0 % (ref 0–5)
HCT: 31.5 % — ABNORMAL LOW (ref 39.0–52.0)
HEMOGLOBIN: 10 g/dL — AB (ref 13.0–17.0)
LYMPHS ABS: 1.4 10*3/uL (ref 0.7–4.0)
LYMPHS PCT: 14 % (ref 12–46)
MCH: 26.6 pg (ref 26.0–34.0)
MCHC: 31.7 g/dL (ref 30.0–36.0)
MCV: 83.8 fL (ref 78.0–100.0)
MONO ABS: 0.9 10*3/uL (ref 0.1–1.0)
Monocytes Relative: 9 % (ref 3–12)
NEUTROS ABS: 7.6 10*3/uL (ref 1.7–7.7)
NEUTROS PCT: 77 % (ref 43–77)
Platelets: 520 10*3/uL — ABNORMAL HIGH (ref 150–400)
RBC: 3.76 MIL/uL — AB (ref 4.22–5.81)
RDW: 18.3 % — ABNORMAL HIGH (ref 11.5–15.5)
WBC: 9.9 10*3/uL (ref 4.0–10.5)

## 2013-10-31 LAB — COMPLETE METABOLIC PANEL WITH GFR
ALBUMIN: 2.8 g/dL — AB (ref 3.5–5.2)
ALK PHOS: 59 U/L (ref 39–117)
ALT: 14 U/L (ref 0–53)
AST: 14 U/L (ref 0–37)
BUN: 11 mg/dL (ref 6–23)
CHLORIDE: 96 meq/L (ref 96–112)
CO2: 26 meq/L (ref 19–32)
Calcium: 8.8 mg/dL (ref 8.4–10.5)
Creat: 0.9 mg/dL (ref 0.50–1.35)
GFR, Est African American: >89 mL/min
GFR, Est Non African American: 83 mL/min
Glucose, Bld: 111 mg/dL — ABNORMAL HIGH (ref 70–99)
Potassium: 3.9 mEq/L (ref 3.5–5.3)
SODIUM: 134 meq/L — AB (ref 135–145)
TOTAL PROTEIN: 7.2 g/dL (ref 6.0–8.3)
Total Bilirubin: 0.4 mg/dL (ref 0.2–1.2)

## 2013-10-31 MED ORDER — PROMETHAZINE HCL 25 MG RE SUPP: 25.0000 mg | Freq: Four times a day (QID) | RECTAL | Status: AC | PRN

## 2013-10-31 NOTE — Progress Notes (Signed)
Subjective:    Patient ID: Taylor Keller, male    DOB: [birth year], 78 y.o.   MRN: [id number]  HPI  Patient was recently admitted to the hospital. I have copied well portions of his discharge summary below and included them for my reference: Admission date: 10/07/2013 Admitting Physician Raylene Miyamoto, MD  Discharge Date: 10/21/2013  Primary MD Odette Fraction, MD  Recommendations for primary care physician for things to follow:  Check CBC, CMP 2 view chest x-ray next visit. Goal of care directed towards comfort. If patient and family desire they can follow with oncology, neurology and cardiology in the outpatient setting.  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  Lumbar stenosis      L5   .  Hypothyroidism    .  Hyperlipidemia    .  Hypertension    .  ED (erectile dysfunction)    .  Internal carotid artery stenosis  5/15     bilateral, 40-59%   .  Myocardial infarction  08/2013   .  Heart murmur    .  Stroke  ~ 2011     "slight; no permanent damage"    Past Surgical History   Procedure  Laterality  Date   .  Tonsillectomy and adenoidectomy     .  Appendectomy     .  Coronary angioplasty with stent placement   08/2013     "2"   .  Cataract extraction, bilateral  Bilateral  ~ 2012   History of present illness and Hospital Course:  Kindly see H&P for history of present illness and admission details, please review complete Labs, Consult reports and Test reports for all details in brief  HPI  78 year old male with cardiac arrest 2nd to MI in July 2015 (tx at outside hospital), and cerebellar hemorrhage 09/25/2013 briefly his aspirin Plavix were held and then resumed  prior to nursing home discharge, he was subsequently discharged to inpatient rehab 8/25. While there, he developed fevers and lethargy. Medical staff there was concerned for sepsis, PCCM called to readmit on 10/07/2013. Patient required brief intubation. He had some baseline dysphagia which got worse after extubation, he is currently n.p.o. with an NG tube and tube feeds. 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 Marin Olp along with Dr. Alen Blew, preliminary consensus is that his prognosis long-term is poor. Palliative care will also be involved.  He was kept initially pulmonary critical care service, transferred to my care on 10/16/2013 on day 9 of his hospital stay.  Hospital Course  R Renal mass - renal cell CA  confirmed with [place] and CT - likely mets to adrenal gland and retroperitoneum - Urology feels they have little to offer and that surgery is not indicated, seen by oncology and IR. - CT of the chest is negative for pulmonary metastases - IR completed CT guided bx 9/8 pathology showing clear-cell renal cell carcinoma, per oncology Dr Alen Blew Long-term prognosis poor oncology appears bleak, they decided against the wall radiation or surgery etc. this time.  Palliative was consulted, patient and family considered hospice but for  now want to go with home health, they want to transition to hospice if he declines which I think will happen very soon. They have refused placement. Goal of care is comfort.  Dysphagia  Was on honey thick liquid after d/c from [place] [facility] July 2015 - MBS 8/3 resulted in rec for D3 w/ thin w/ chin tuck - unfortunately f/u MBS 9/5 suggests NPO status only - placed feeding tub > tube feeds -SLP to cont to follow and re-study when indicated . Speech re consulted but he failed modified barium again on 10/17/2013, patient currently deciding against PEG tube.  Palliative care to  me the patient and family and he decided to discontinue NG tube, and go with comfort feeds with the full knowledge that he is very high risk for aspiration causing pneumonia or death. They want to take him home with home health and transition to hospice if he declines. They have refused placement. I had strongly suggested placement I think is too frail to go home .  Septic shock - unclear etiology  UA negative 8/31 but 21-50 WBC on 9/2 - urine culture negative - blood culture no growth - CXR not c/w pneumonia or aspiration - completed empiric abx for 7 days of tx  Stress cardiac ischemia - CAD s/p cardiac arrest July 2015 s/p DES x2  V fib cardiac arrest on 08/21/2013 in [place] requiring CPR for several minutes - cardiac cath on 08/25/2013 and received drug-eluting stent to LAD and another drug-eluting stent to left circumflex artery - residual 50% stenosis in the RCA - patient was discharged on 09/05/2013 - troponin increased during septic episode, but has now normalized, w/ no s/sx to suggest active ischemia - ASA - plavix resumed post bx on 9/9  Acute resp failure in setting of septic shock  Intubated 9/1 > extubated 9/3 - resp status stable  Acute encephalopathy  secondary to hypotension in setting carotid dz - mental status much improved and at baseline  Recent R cerebellar bleed with dysphagia  Due to malignant HTN in setting of ASA/Plavix - stable at this time, he was on aspirin Plavix at rehabilitation prior to present hospital admission. I have noted MRI done on 10/11/2013. Discussed the MRI brain results with neurosurgeon on call Dr. Arnoldo Morale on 10/16/2013. Per Dr. Arnoldo Morale repeat MRI looks improved. No acute interventions or change is needed in treatment of medications.  Hypovolemia, hyponatremia , hypokalemia  Resolved w/ IVF and supplementation  Anemia - chronic  Hgb is stable - follow post bx w/ resumption of plavix  Coagulapathy  Due to sepsis? - resolved  Hypothyroid  On synthroid -  TSH 4.173  Severe malnutrition in the context of acute illness  NG tube now removed, on comfort feeds, will order protein supplementation.  Discharge Condition: Stable with extremely poor long-term prognosis, family for now wants to take him home with home health, they want to transition to hospice soon. They have refused placement.   Patient is here today with his wife. He is extremely frail and cachectic appearing. He is having a difficult time eating due to persistent nausea. He like to have Phenergan suppositories that he can take if necessary for nausea. He denies any pain. He denies any chest pain or shortness of breath. He is wet he has stage IV cancer. At the present time he is not willing to undergo chemotherapy. He like to focus on his quality of life. He is also aware he is at high risk for aspiration due  to dysphagia.  However at the present time, he would like to continue eating to preserve his quality of life. He is aware of the risk and is willing to accept this risk. He is here today for followup. Past Medical History  Diagnosis Date  . Lumbar stenosis     L5  . Hypothyroidism   . Hyperlipidemia   . Hypertension   . ED (erectile dysfunction)   . Internal carotid artery stenosis 5/15    bilateral, 40-59%  . Myocardial infarction 08/2013  . Heart murmur   . Stroke ~ 2011    "slight; no permanent damage"  . Cancer     renal   Past Surgical History  Procedure Laterality Date  . Tonsillectomy and adenoidectomy    . Appendectomy    . Coronary angioplasty with stent placement  08/2013    "2"  . Cataract extraction, bilateral Bilateral ~ 2012   Current Outpatient Prescriptions on File Prior to Visit  Medication Sig Dispense Refill  . acetaminophen (TYLENOL) 325 MG tablet Take 2 tablets (650 mg total) by mouth every 4 (four) hours as needed for headache or mild pain.      Marland Kitchen albuterol (PROVENTIL) (5 MG/ML) 0.5% nebulizer solution Take 2.5 mg by nebulization every 6 (six) hours  as needed for wheezing or shortness of breath.      Marland Kitchen antiseptic oral rinse (CPC / CETYLPYRIDINIUM CHLORIDE 0.05%) 0.05 % LIQD solution 7 mLs by Mouth Rinse route QID.  44 mL  0  . aspirin 81 MG chewable tablet Chew 81 mg by mouth daily.      . clopidogrel (PLAVIX) 75 MG tablet Take 75 mg by mouth daily.      . diphenhydrAMINE (BENADRYL) 25 MG tablet Take 25 mg by mouth at bedtime as needed for sleep.       . feeding supplement (BOOST HIGH PROTEIN) LIQD Take 1 Container by mouth 3 (three) times daily between meals.  960 mL  0  . food thickener (RESOURCE THICKENUP CLEAR) POWD Used to make liquids pudding thick  1 Can  1  . ipratropium (ATROVENT) 0.02 % nebulizer solution Take 0.5 mg by nebulization every 6 (six) hours as needed for wheezing or shortness of breath.      . isosorbide mononitrate (IMDUR) 60 MG 24 hr tablet Take 60 mg by mouth daily.      . lansoprazole (PREVACID SOLUTAB) 30 MG disintegrating tablet Take 30 mg by mouth daily.      Marland Kitchen levothyroxine (SYNTHROID, LEVOTHROID) 175 MCG tablet Take 175 mcg by mouth daily.      Marland Kitchen LORazepam (ATIVAN) 0.5 MG tablet Take 1 tablet (0.5 mg total) by mouth 3 (three) times daily as needed for anxiety or sleep.  30 tablet  0  . metoprolol (LOPRESSOR) 50 MG tablet Take 50 mg by mouth 2 (two) times daily.      . Morphine Sulfate (MORPHINE CONCENTRATE) 10 mg / 0.5 ml concentrated solution Take 0.25 mLs (5 mg total) by mouth every 3 (three) hours as needed for moderate pain or shortness of breath.  30 mL  0  . nitroGLYCERIN (NITROSTAT) 0.4 MG SL tablet Place 1 tablet (0.4 mg total) under the tongue every 5 (five) minutes as needed for chest pain.  25 tablet  prn  . promethazine (PHENERGAN) 25 MG tablet Take 1 tablet (25 mg total) by mouth every 4 (four) hours as needed for nausea or vomiting.  30 tablet  0  . sorbitol 70 % SOLN  Take 30 mLs by mouth daily as needed for moderate constipation.  500 mL  0   No current facility-administered medications on file  prior to visit.   Allergies  Allergen Reactions  . Benazepril Cough   History   Social History  . Marital Status: Married    Spouse Name: N/A    Number of Children: N/A  . Years of Education: N/A   Occupational History  . Not on file.   Social History Main Topics  . Smoking status: Never Smoker   . Smokeless tobacco: Never Used  . Alcohol Use: Yes     Comment: 09/08/2013 "couple glasses of wine once/month"  . Drug Use: No  . Sexual Activity: Yes   Other Topics Concern  . Not on file   Social History Narrative  . No narrative on file    Review of Systems  All other systems reviewed and are negative.      Objective:   Physical Exam  Vitals reviewed. Cardiovascular: Normal rate, regular rhythm and normal heart sounds.   Pulmonary/Chest: Effort normal and breath sounds normal. No respiratory distress. He has no wheezes. He has no rales.  Abdominal: Soft. Bowel sounds are normal. He exhibits no distension. There is no tenderness. There is no rebound.   confined to a wheelchair due to weakness. Very cachectic appearing. Occasionally falls asleep during our encounter        Assessment & Plan:  Hospital discharge follow-up - Plan: CBC with Differential, COMPLETE METABOLIC PANEL WITH GFR, DG Chest 2 View   Family is not interested in feeding tube. They're also not interested in chemotherapy. The body of life. At the present time they're not interested in hospice referral. Instead they like to try to control his symptoms as best we can. Therefore I gave him a prescription for Phenergan 25 mg suppositories to be used per rectum every 6 hours as needed for nausea.  Followup lab work recommended by the [facility] physicians including a CBC and CMP. Also recommended repeating a chest x-ray today wet for aspiration pneumonia. At the present time the patient does not want to go chest x-ray. I will wait results of his lab work.

## 2013-11-03 ENCOUNTER — Inpatient Hospital Stay: Payer: Medicare HMO | Admitting: Physical Medicine & Rehabilitation

## 2013-11-03 ENCOUNTER — Other Ambulatory Visit: Payer: Self-pay | Admitting: Family Medicine

## 2013-11-03 ENCOUNTER — Telehealth: Payer: Self-pay | Admitting: Family Medicine

## 2013-11-03 MED ORDER — SORBITOL 70 % SOLN: 30.0000 mL | Freq: Every day | Status: DC | PRN

## 2013-11-03 MED ORDER — METOPROLOL TARTRATE 50 MG PO TABS: 50.0000 mg | ORAL_TABLET | Freq: Two times a day (BID) | ORAL | Status: AC

## 2013-11-03 MED ORDER — SORBITOL 70 % SOLN: 30.0000 mL | Freq: Every day | Status: AC | PRN

## 2013-11-03 MED ORDER — ISOSORBIDE MONONITRATE ER 60 MG PO TB24: 60.0000 mg | ORAL_TABLET | Freq: Every day | ORAL | Status: AC

## 2013-11-03 MED ORDER — CLOPIDOGREL BISULFATE 75 MG PO TABS: 75.0000 mg | ORAL_TABLET | Freq: Every day | ORAL | Status: AC

## 2013-11-03 MED ORDER — ATORVASTATIN CALCIUM 80 MG PO TABS: 80.0000 mg | ORAL_TABLET | Freq: Every evening | ORAL | Status: AC

## 2013-11-03 NOTE — Telephone Encounter (Signed)
PA was sent to [facility] for phenergan supositiories.  Pt has had a stroke and had difficulty swallowing pills, esp when nauseated.  Wife called this morning (11/03/13)  Told me insurance had called them to say medication has been approved

## 2013-11-05 ENCOUNTER — Other Ambulatory Visit: Payer: Self-pay | Admitting: Family Medicine

## 2013-11-05 ENCOUNTER — Ambulatory Visit (HOSPITAL_COMMUNITY): Payer: Medicare HMO

## 2013-11-06 NOTE — Telephone Encounter (Signed)
Medication refilled per protocol. 

## 2013-11-07 ENCOUNTER — Ambulatory Visit (HOSPITAL_COMMUNITY): Payer: Medicare HMO

## 2013-11-10 ENCOUNTER — Telehealth: Payer: Self-pay | Admitting: *Deleted

## 2013-11-10 NOTE — Telephone Encounter (Signed)
Try zofran 4 q 8 hrs prn nausea (30)

## 2013-11-10 NOTE — Telephone Encounter (Signed)
lmtrc

## 2013-11-10 NOTE — Telephone Encounter (Signed)
Pt wife Manuela Schwartz came in office today and wanted to know if can increase his Zofran, he is having increased nausea and states have tried suppostories and nothing seems to be working. Please advise.

## 2013-11-11 NOTE — Telephone Encounter (Signed)
Contacted pts wife and she had stated that he is taking Zofran 4mg  now and taking every 4hrs, but is wanting to know if he can take more of the 4mg  if needed and if not can you increase the dosage some to relieve the nausea?

## 2013-11-12 ENCOUNTER — Ambulatory Visit (HOSPITAL_COMMUNITY): Payer: Medicare HMO

## 2013-11-14 ENCOUNTER — Ambulatory Visit (HOSPITAL_COMMUNITY): Payer: Medicare HMO

## 2013-11-19 ENCOUNTER — Other Ambulatory Visit: Payer: Self-pay | Admitting: *Deleted

## 2013-11-19 ENCOUNTER — Ambulatory Visit (HOSPITAL_COMMUNITY): Payer: Medicare HMO

## 2013-11-19 ENCOUNTER — Telehealth: Payer: Self-pay | Admitting: Family Medicine

## 2013-11-19 MED ORDER — ONDANSETRON HCL 4 MG PO TABS: 4.0000 mg | ORAL_TABLET | Freq: Three times a day (TID) | ORAL | Status: DC | PRN

## 2013-11-19 NOTE — Telephone Encounter (Signed)
Abigail Butts a Electrical engineer from [facility] [place]  is calling to speak with you regarding some of this patients medication please call her back at [phone]

## 2013-11-19 NOTE — Telephone Encounter (Signed)
Received fax requesting refill on Zofran.  Prescription sent to pharmacy.

## 2013-11-20 ENCOUNTER — Other Ambulatory Visit: Payer: Self-pay | Admitting: Family Medicine

## 2013-11-20 NOTE — Telephone Encounter (Signed)
Spoke to Taylor Keller 11/06/13 and pt is having increased N&V and wanted to know if he could take the phenergan and zofran together. Per Dr. Buelah Manis - NO but can increase zofran to 8mg  q 6 hours and not take the phenergan at all. She will relay this to pt and med was sent to pharm.

## 2013-11-21 ENCOUNTER — Ambulatory Visit (HOSPITAL_COMMUNITY): Payer: Medicare HMO

## 2013-11-21 ENCOUNTER — Telehealth: Payer: Self-pay | Admitting: Family Medicine

## 2013-11-21 NOTE — Telephone Encounter (Signed)
Taylor Keller his speech therapist calling to get some clarification on some of his med  Please call her back at [phone]

## 2013-11-21 NOTE — Telephone Encounter (Signed)
LMTRC

## 2013-11-24 ENCOUNTER — Telehealth: Payer: Self-pay | Admitting: Family Medicine

## 2013-11-24 NOTE — Telephone Encounter (Signed)
[phone]  PT wife is needing to speak to you about the medication ondansetron (ZOFRAN) 4 MG tablet

## 2013-11-25 NOTE — Telephone Encounter (Signed)
Spoke with wife and she states she is only receiving medications for only 3 1/2 days and she had called pharmacy in [place] and it is taken care of. Pt also states that medications are being sent to [facility] on [address] and would like to be changed to [facility] in [place].

## 2013-11-26 ENCOUNTER — Ambulatory Visit (HOSPITAL_COMMUNITY): Payer: Medicare HMO

## 2013-11-26 NOTE — Telephone Encounter (Signed)
Received a call from nurse at [facility] regarding pt and she stated that the Zofran is not working and pt is unable to eat anything at all. He is very weak due to the fact he can not eat. (We changed him from Phenergan susp. And oral tablets to the Zofran and increased dose from 4mg  to 8mg ) She wanted to know what other options they had for this patient?? (FYI for me - Please call pt back and inform them of our plan)

## 2013-11-27 NOTE — Telephone Encounter (Signed)
I am out of options. He is on maximum dose Zofran and Phenergan suppositories. I would recommend hospice consultation as soon as possible. Are they were willing to pursue this?

## 2013-11-28 ENCOUNTER — Telehealth: Payer: Self-pay | Admitting: Family Medicine

## 2013-11-28 ENCOUNTER — Ambulatory Visit (HOSPITAL_COMMUNITY): Payer: Medicare HMO

## 2013-11-28 NOTE — Telephone Encounter (Signed)
LMTRC

## 2013-11-28 NOTE — Telephone Encounter (Signed)
Per [facility] may do Fleets Enema prn

## 2013-11-28 NOTE — Telephone Encounter (Signed)
Pt's wife is aware of the below and they will talk over about Hospice and let us know if we need to send in a referral to have hospice involved in his care.

## 2013-11-28 NOTE — Telephone Encounter (Signed)
Pt's wife aware.

## 2013-11-28 NOTE — Telephone Encounter (Signed)
Manuela Schwartz, patients wife calling to say that terry is constipated, has not had a bowel movement in 1 week, would like to know if there is anything else he can do besides take a stool softner  [phone]

## 2013-12-02 ENCOUNTER — Telehealth: Payer: Self-pay | Admitting: Family Medicine

## 2013-12-02 NOTE — Telephone Encounter (Signed)
Stacy from hospice is calling regarding this patient would like to know if dr pickard would be the attending provider at hospice  Please call her back at [phone]

## 2013-12-02 NOTE — Telephone Encounter (Signed)
ok 

## 2013-12-02 NOTE — Telephone Encounter (Signed)
Stacy called back.  Patient has contacted them to start Hospice care beginning next week.  They wanted Dr Dennard Schaumann OK to still be his physician with hospice providers on call as needed.  Approval was given.

## 2013-12-03 ENCOUNTER — Ambulatory Visit (HOSPITAL_COMMUNITY): Payer: Medicare HMO

## 2013-12-04 ENCOUNTER — Other Ambulatory Visit: Payer: Self-pay | Admitting: *Deleted

## 2013-12-04 MED ORDER — ONDANSETRON HCL 4 MG PO TABS: 4.0000 mg | ORAL_TABLET | Freq: Three times a day (TID) | ORAL | Status: DC | PRN

## 2013-12-04 MED ORDER — ONDANSETRON HCL 8 MG PO TABS: 8.0000 mg | ORAL_TABLET | Freq: Three times a day (TID) | ORAL | Status: AC | PRN

## 2013-12-04 NOTE — Telephone Encounter (Signed)
Received fax requesting refill on Zofran.   Prescription sent to pharmacy.

## 2013-12-04 NOTE — Telephone Encounter (Signed)
Incorrect dosage sent to pharmacy.   New prescription sent to pharmacy with corrected dose.

## 2013-12-05 ENCOUNTER — Ambulatory Visit (HOSPITAL_COMMUNITY): Payer: Medicare HMO

## 2013-12-10 ENCOUNTER — Ambulatory Visit (HOSPITAL_COMMUNITY): Payer: Medicare HMO

## 2013-12-10 ENCOUNTER — Telehealth: Payer: Self-pay | Admitting: Family Medicine

## 2013-12-10 NOTE — Telephone Encounter (Signed)
Santiago Glad from hospice calling to ask some questions about increasing his nausea medication and also getting refill on zofran , please call her back at [phone]

## 2013-12-11 NOTE — Telephone Encounter (Signed)
Hospice nurse called just for an FYI to let you know that their MD increased his ativan .5mg  tid to .5mg  q4 hours for his nausea and it seems to be working well at this point just makes him sleep a lot. Which they knew it would and she states that the mass in his abdomen has grown and is pressing against his stomach and that is what they think is causing him the increased nausea which they know there is nothing to be done about that.

## 2013-12-11 NOTE — Telephone Encounter (Signed)
LMTRC

## 2013-12-12 ENCOUNTER — Ambulatory Visit (HOSPITAL_COMMUNITY): Payer: Medicare HMO

## 2013-12-17 ENCOUNTER — Ambulatory Visit (HOSPITAL_COMMUNITY): Payer: Medicare HMO

## 2013-12-19 ENCOUNTER — Ambulatory Visit (HOSPITAL_COMMUNITY): Payer: Medicare HMO

## 2013-12-19 ENCOUNTER — Telehealth: Payer: Self-pay | Admitting: Family Medicine

## 2013-12-19 MED ORDER — HYDROMORPHONE HCL 2 MG PO TABS: ORAL_TABLET | ORAL | Status: AC

## 2013-12-19 NOTE — Telephone Encounter (Signed)
Hospice called and states that the Morphine pt is taking for pain causes him extreme N&V and would like to know if you wanted to change that to something else?  [facility] - [phone]

## 2013-12-19 NOTE — Telephone Encounter (Signed)
Patient can take Dilaudid 2-4 mg every 6 hours as needed for pain. 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

## 2013-12-19 NOTE — Telephone Encounter (Signed)
Hospice aware and rx faxed to pharm

## 2013-12-24 ENCOUNTER — Ambulatory Visit (HOSPITAL_COMMUNITY): Payer: Medicare HMO

## 2013-12-26 ENCOUNTER — Ambulatory Visit (HOSPITAL_COMMUNITY): Payer: Medicare HMO

## 2013-12-31 ENCOUNTER — Ambulatory Visit (HOSPITAL_COMMUNITY): Payer: Medicare HMO

## 2014-01-06 DEATH — deceased

## 2014-01-07 ENCOUNTER — Ambulatory Visit (HOSPITAL_COMMUNITY): Payer: Medicare HMO

## 2014-01-09 ENCOUNTER — Ambulatory Visit (HOSPITAL_COMMUNITY): Payer: Medicare HMO

## 2014-01-14 ENCOUNTER — Ambulatory Visit (HOSPITAL_COMMUNITY): Payer: Medicare HMO

## 2014-01-16 ENCOUNTER — Ambulatory Visit (HOSPITAL_COMMUNITY): Payer: Medicare HMO

## 2014-01-21 ENCOUNTER — Ambulatory Visit (HOSPITAL_COMMUNITY): Payer: Medicare HMO

## 2014-01-23 ENCOUNTER — Ambulatory Visit (HOSPITAL_COMMUNITY): Payer: Medicare HMO

## 2014-01-28 ENCOUNTER — Ambulatory Visit (HOSPITAL_COMMUNITY): Payer: Medicare HMO

## 2014-10-19 IMAGING — CR DG ABD PORTABLE 1V
1 series · 1 of 1 positions shown · non-contrast
Comparison: 10/11/2013

CLINICAL DATA: Feeding tube placement.

EXAM:
PORTABLE ABDOMEN - 1 VIEW

[AP]
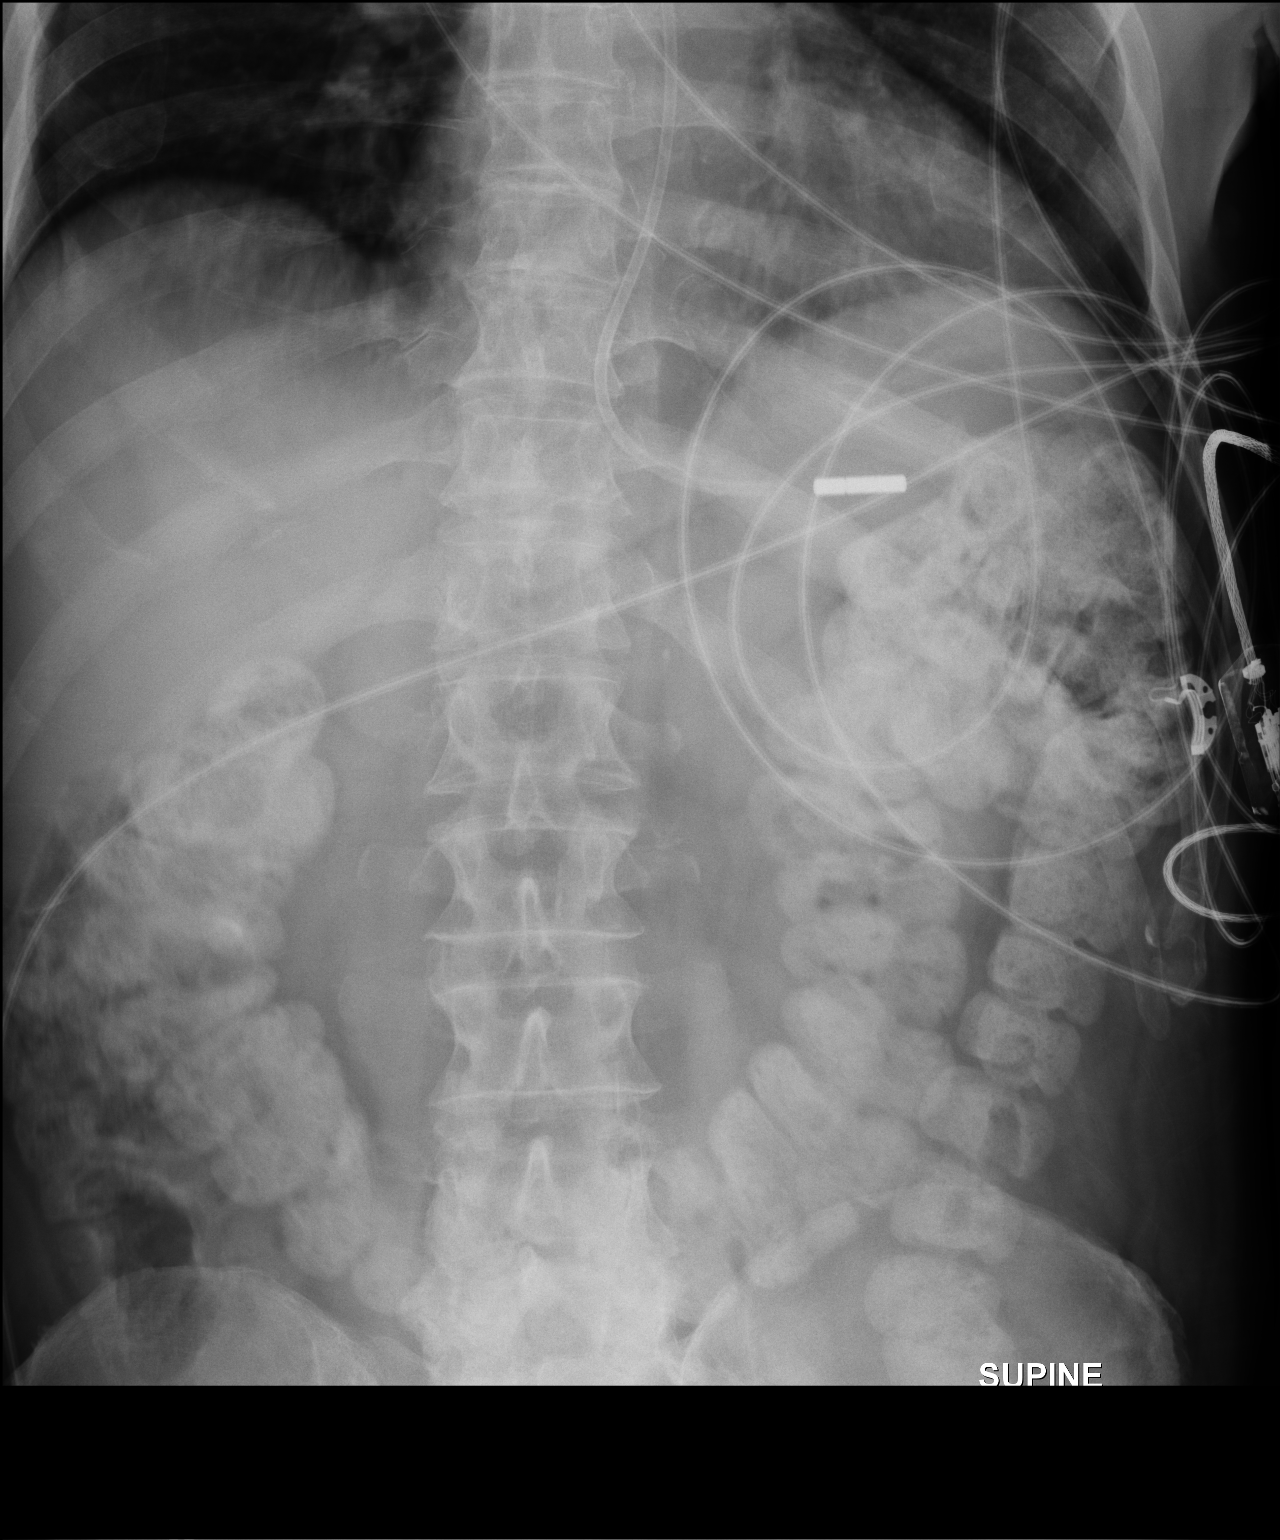

[1 of 1 positions shown; findings below may reference images not displayed]

FINDINGS: Feeding tube extends into the proximal stomach. No evidence of bowel
obstruction or ileus.
IMPRESSION: Tip of feeding tube remains in the proximal stomach.

## 2014-10-21 IMAGING — CT CT BIOPSY
1 of 2 series · 15 of 32 positions shown, 19 images · non-contrast
Comparison: none

CLINICAL DATA: 78-year-old male with enhancing right renal mass and
a massive right retroperitoneal mass/ adenopathy. Imaging findings
are highly concerning for a metastatic renal cell carcinoma.
CT-guided biopsy is warranted for tissue diagnosis.
TECHNIQUE: Informed consent was obtained from the patient following explanation
of the procedure, risks, benefits and alternatives. The patient
understands, agrees and consents for the procedure. All questions
were addressed. A time out was performed.

[Series 3: i-spiral 5.0 b40f · axial · 0.79mm/px · z∈[+1127,+1298]mm · 15 of 55 slices shown, 19 images]
[im 3/55  soft-tissue]
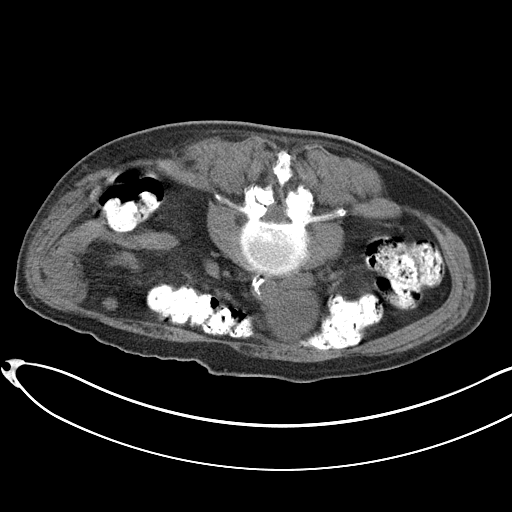
[im 3/55  bone]
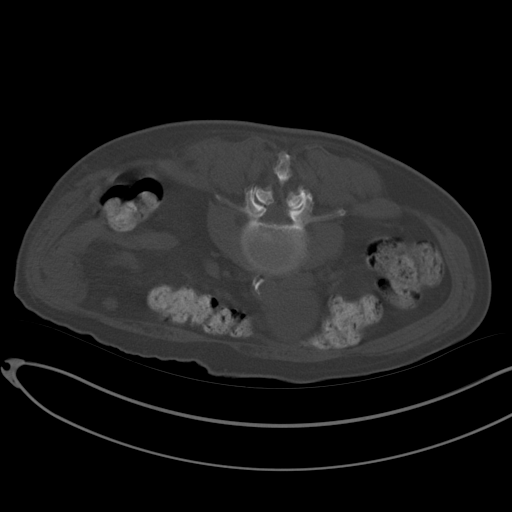
[im 8/55  soft-tissue]
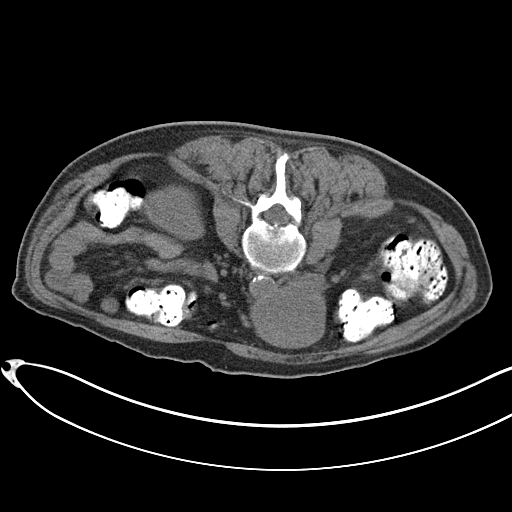
[im 12/55  soft-tissue]
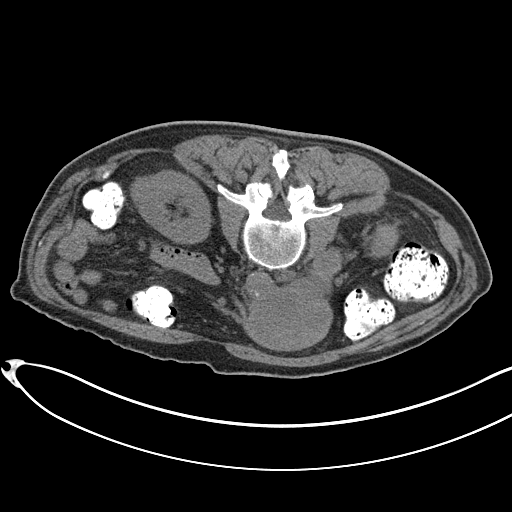
[im 15/55  soft-tissue]
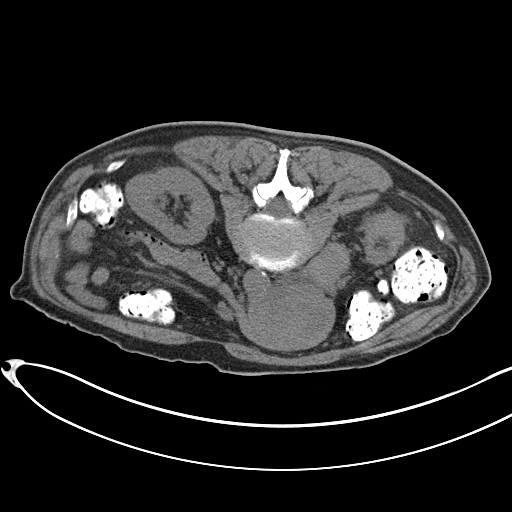
[im 19/55  soft-tissue]
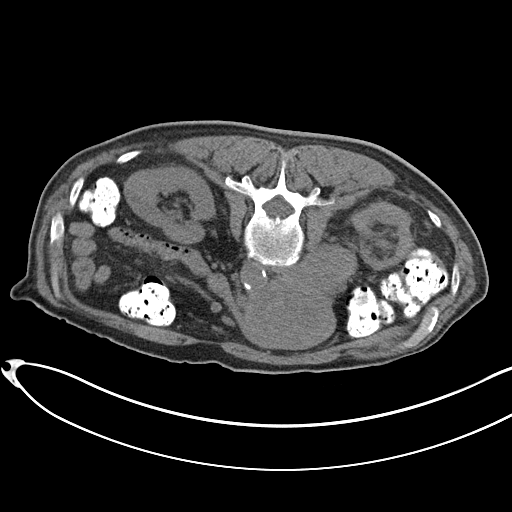
[im 24/55  soft-tissue]
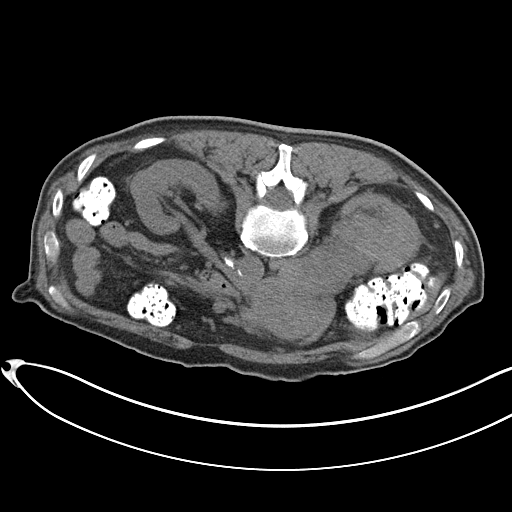
[im 29/55  soft-tissue]
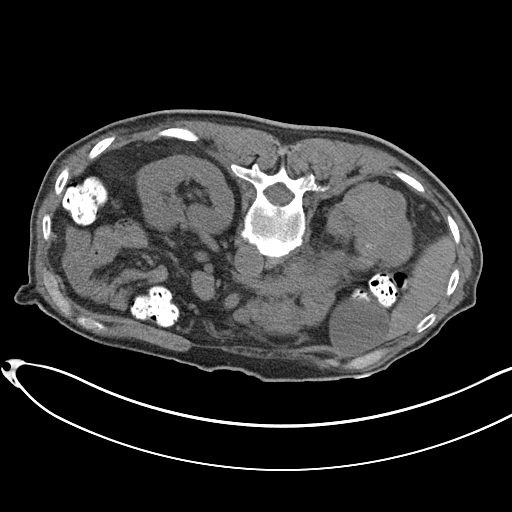
[im 31/55  soft-tissue]
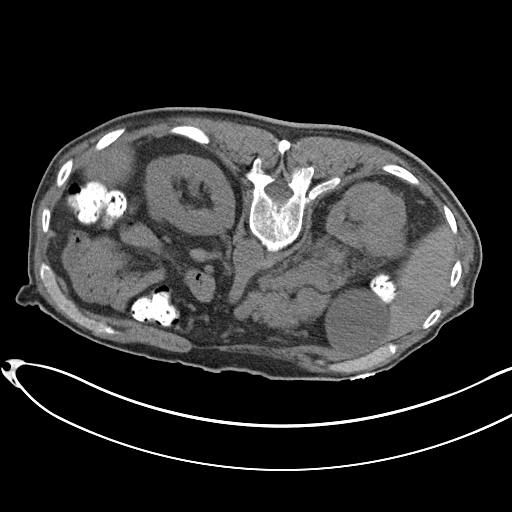
[im 36/55  soft-tissue]
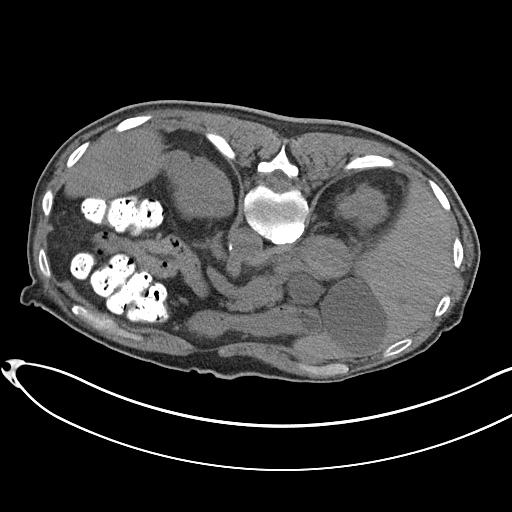
[im 36/55  bone]
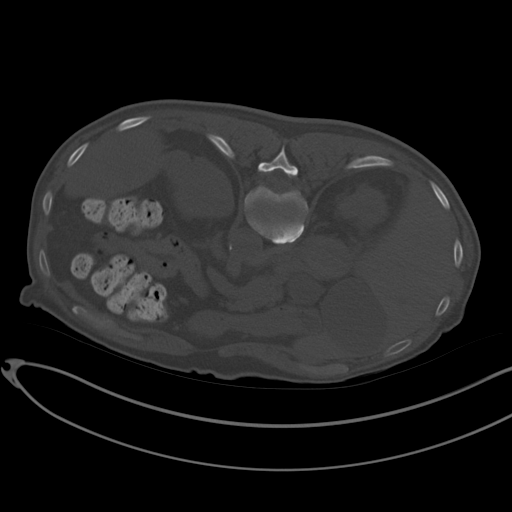
[im 40/55  soft-tissue]
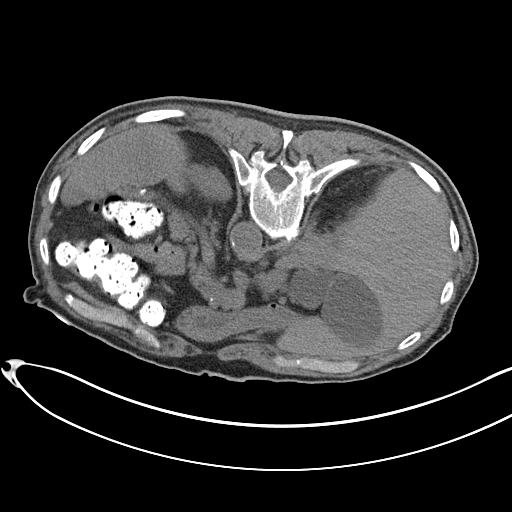
[im 43/55  soft-tissue]
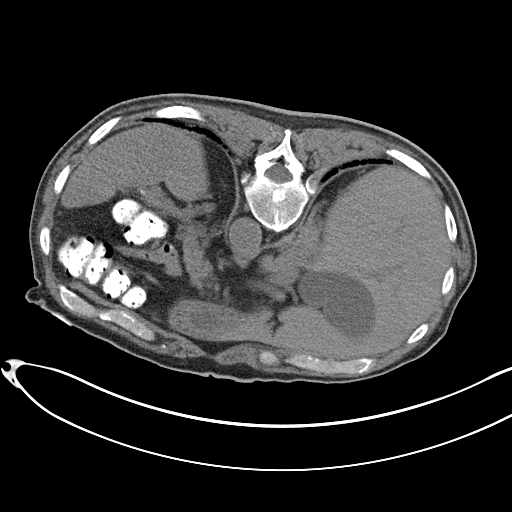
[im 45/55  lung]
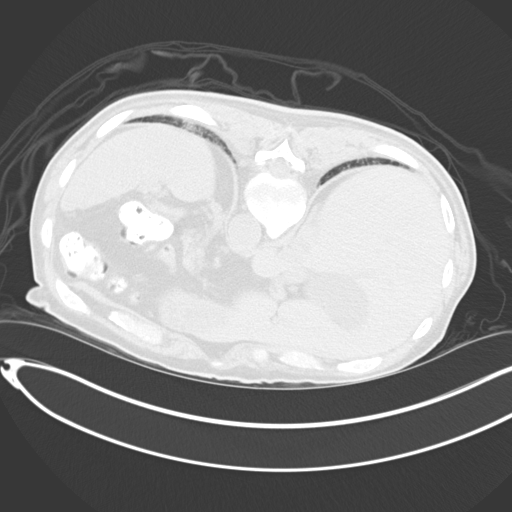
[im 47/55  soft-tissue]
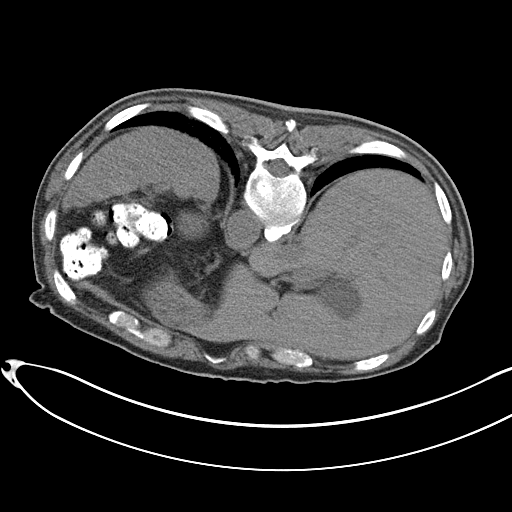
[im 47/55  lung]
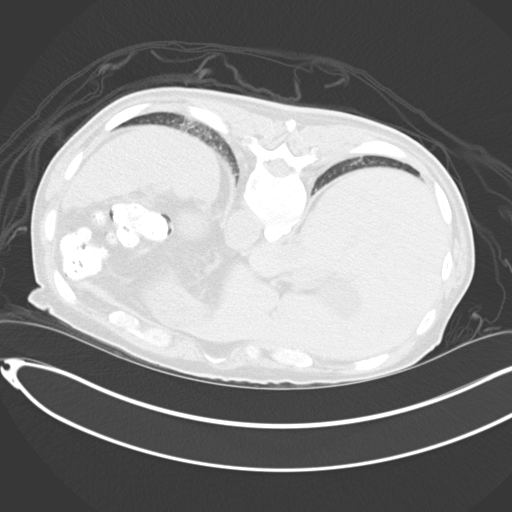
[im 50/55  lung]
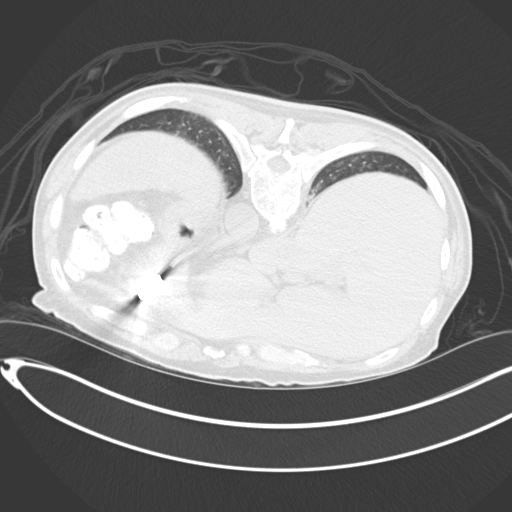
[im 52/55  soft-tissue]
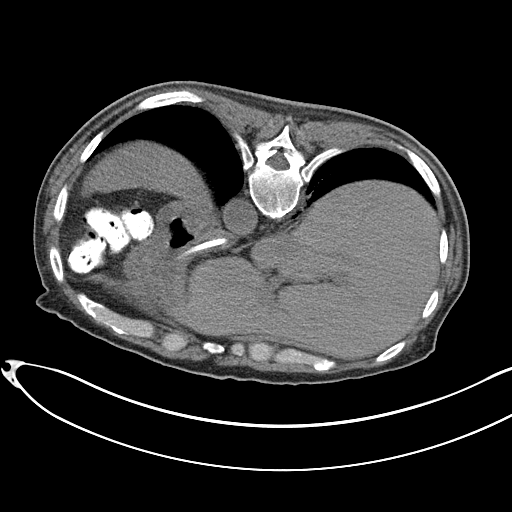
[im 52/55  lung]
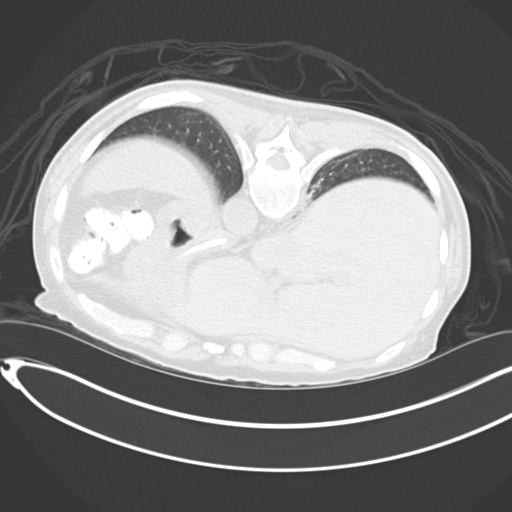

[15 of 32 positions shown; findings below may reference images not displayed]

EXAM:
CT BIOPSY

Date: 10/14/2013

PROCEDURE:
1. CT-guided biopsy of right retroperitoneal mass

ANESTHESIA/SEDATION:
Moderate (conscious) sedation was used. 0.5 mg Versed, 25 mcg
Fentanyl were administered intravenously. The patient's vital signs
were monitored continuously by radiology nursing throughout the
procedure.

Sedation Time: 12 minutes
Planning axial CT scan was performed. The right retroperitoneal soft
tissue mass was identified. A suitable skin entry site was selected
and marked. The region was then sterilely prepped and draped in the
standard fashion using Betadine skin prep. Local anesthesia was
attained by infiltration with 1% lidocaine. A small dermatotomy was
made.

Under intermittent CT fluoroscopic guidance, a 17 gauge trocar
needle was advanced through the right paraspinal musculature and
into the margin of the mass. Multiple 18 gauge core biopsies were
then coaxially obtained using the Lazara Ducharme automated biopsy device.
Biopsy specimens were divided amongst formalin and saline and sent
to pathology for further evaluation.

Post biopsy axial CT imaging demonstrates no evidence of
retroperitoneal hematoma or other abnormality. The needle was
removed. The patient tolerated the procedure well.

COMPLICATIONS:
None
IMPRESSION: Technically successful CT-guided core biopsy of right
retroperitoneal soft tissue mass.
